# Patient Record
Sex: Male | Born: 1960 | Race: Black or African American | Hispanic: No | Marital: Single | Smoking: Never smoker
Health system: Southern US, Community
[De-identification: ages and names within clinical notes are randomized; demographics above are authoritative.]

## PROBLEM LIST (undated history)

## (undated) DIAGNOSIS — F840 Autistic disorder: Secondary | ICD-10-CM

## (undated) DIAGNOSIS — T7840XA Allergy, unspecified, initial encounter: Secondary | ICD-10-CM

## (undated) DIAGNOSIS — F419 Anxiety disorder, unspecified: Secondary | ICD-10-CM

## (undated) DIAGNOSIS — F99 Mental disorder, not otherwise specified: Secondary | ICD-10-CM

## (undated) DIAGNOSIS — F79 Unspecified intellectual disabilities: Secondary | ICD-10-CM

## (undated) HISTORY — DX: Autistic disorder: F84.0

## (undated) HISTORY — PX: NO PAST SURGERIES: SHX2092

## (undated) HISTORY — DX: Anxiety disorder, unspecified: F41.9

## (undated) HISTORY — DX: Allergy, unspecified, initial encounter: T78.40XA

## (undated) HISTORY — DX: Unspecified intellectual disabilities: F79

## (undated) HISTORY — DX: Mental disorder, not otherwise specified: F99

## (undated) HISTORY — PX: MRI: SHX5353

---

## 2005-01-14 ENCOUNTER — Ambulatory Visit: Payer: Self-pay | Admitting: Family Medicine

## 2008-02-21 DIAGNOSIS — J301 Allergic rhinitis due to pollen: Secondary | ICD-10-CM | POA: Insufficient documentation

## 2012-01-06 DIAGNOSIS — R358 Other polyuria: Secondary | ICD-10-CM | POA: Diagnosis not present

## 2012-01-06 DIAGNOSIS — J309 Allergic rhinitis, unspecified: Secondary | ICD-10-CM | POA: Diagnosis not present

## 2012-01-06 DIAGNOSIS — R3589 Other polyuria: Secondary | ICD-10-CM | POA: Diagnosis not present

## 2012-01-06 DIAGNOSIS — Z79899 Other long term (current) drug therapy: Secondary | ICD-10-CM | POA: Diagnosis not present

## 2012-01-06 DIAGNOSIS — F840 Autistic disorder: Secondary | ICD-10-CM | POA: Diagnosis not present

## 2012-05-10 DIAGNOSIS — Z23 Encounter for immunization: Secondary | ICD-10-CM | POA: Diagnosis not present

## 2012-05-10 DIAGNOSIS — R358 Other polyuria: Secondary | ICD-10-CM | POA: Diagnosis not present

## 2012-05-10 DIAGNOSIS — R7309 Other abnormal glucose: Secondary | ICD-10-CM | POA: Diagnosis not present

## 2012-05-10 DIAGNOSIS — Z0000 Encounter for general adult medical examination without abnormal findings: Secondary | ICD-10-CM | POA: Diagnosis not present

## 2012-05-10 DIAGNOSIS — Z125 Encounter for screening for malignant neoplasm of prostate: Secondary | ICD-10-CM | POA: Diagnosis not present

## 2012-05-10 DIAGNOSIS — R3589 Other polyuria: Secondary | ICD-10-CM | POA: Diagnosis not present

## 2012-05-10 DIAGNOSIS — J309 Allergic rhinitis, unspecified: Secondary | ICD-10-CM | POA: Diagnosis not present

## 2012-10-11 DIAGNOSIS — Z23 Encounter for immunization: Secondary | ICD-10-CM | POA: Diagnosis not present

## 2012-12-11 DIAGNOSIS — Z125 Encounter for screening for malignant neoplasm of prostate: Secondary | ICD-10-CM | POA: Diagnosis not present

## 2012-12-11 DIAGNOSIS — R32 Unspecified urinary incontinence: Secondary | ICD-10-CM | POA: Diagnosis not present

## 2012-12-11 DIAGNOSIS — F840 Autistic disorder: Secondary | ICD-10-CM | POA: Diagnosis not present

## 2012-12-11 DIAGNOSIS — F79 Unspecified intellectual disabilities: Secondary | ICD-10-CM | POA: Diagnosis not present

## 2012-12-11 DIAGNOSIS — Z1159 Encounter for screening for other viral diseases: Secondary | ICD-10-CM | POA: Diagnosis not present

## 2012-12-11 DIAGNOSIS — Z79899 Other long term (current) drug therapy: Secondary | ICD-10-CM | POA: Diagnosis not present

## 2012-12-11 DIAGNOSIS — R7309 Other abnormal glucose: Secondary | ICD-10-CM | POA: Diagnosis not present

## 2013-05-14 DIAGNOSIS — Z1331 Encounter for screening for depression: Secondary | ICD-10-CM | POA: Diagnosis not present

## 2013-05-14 DIAGNOSIS — Z125 Encounter for screening for malignant neoplasm of prostate: Secondary | ICD-10-CM | POA: Diagnosis not present

## 2013-05-14 DIAGNOSIS — Z0000 Encounter for general adult medical examination without abnormal findings: Secondary | ICD-10-CM | POA: Diagnosis not present

## 2013-11-04 DIAGNOSIS — F840 Autistic disorder: Secondary | ICD-10-CM | POA: Diagnosis not present

## 2013-11-04 DIAGNOSIS — R3589 Other polyuria: Secondary | ICD-10-CM | POA: Diagnosis not present

## 2013-11-04 DIAGNOSIS — F79 Unspecified intellectual disabilities: Secondary | ICD-10-CM | POA: Diagnosis not present

## 2013-11-04 DIAGNOSIS — Z23 Encounter for immunization: Secondary | ICD-10-CM | POA: Diagnosis not present

## 2013-11-04 DIAGNOSIS — R358 Other polyuria: Secondary | ICD-10-CM | POA: Diagnosis not present

## 2013-11-30 DIAGNOSIS — K625 Hemorrhage of anus and rectum: Secondary | ICD-10-CM | POA: Diagnosis not present

## 2013-11-30 DIAGNOSIS — F88 Other disorders of psychological development: Secondary | ICD-10-CM | POA: Diagnosis not present

## 2013-12-05 DIAGNOSIS — K625 Hemorrhage of anus and rectum: Secondary | ICD-10-CM | POA: Diagnosis not present

## 2014-08-05 DIAGNOSIS — Z23 Encounter for immunization: Secondary | ICD-10-CM | POA: Diagnosis not present

## 2014-11-27 ENCOUNTER — Ambulatory Visit: Payer: Self-pay | Admitting: Family Medicine

## 2014-11-27 DIAGNOSIS — R05 Cough: Secondary | ICD-10-CM | POA: Diagnosis not present

## 2014-11-27 DIAGNOSIS — R509 Fever, unspecified: Secondary | ICD-10-CM | POA: Diagnosis not present

## 2014-12-04 DIAGNOSIS — J309 Allergic rhinitis, unspecified: Secondary | ICD-10-CM | POA: Diagnosis not present

## 2014-12-04 DIAGNOSIS — R4189 Other symptoms and signs involving cognitive functions and awareness: Secondary | ICD-10-CM | POA: Diagnosis not present

## 2014-12-04 DIAGNOSIS — J40 Bronchitis, not specified as acute or chronic: Secondary | ICD-10-CM | POA: Diagnosis not present

## 2014-12-04 DIAGNOSIS — R05 Cough: Secondary | ICD-10-CM | POA: Diagnosis not present

## 2014-12-09 DIAGNOSIS — E785 Hyperlipidemia, unspecified: Secondary | ICD-10-CM | POA: Diagnosis not present

## 2014-12-09 DIAGNOSIS — G4700 Insomnia, unspecified: Secondary | ICD-10-CM | POA: Diagnosis not present

## 2014-12-09 DIAGNOSIS — R4189 Other symptoms and signs involving cognitive functions and awareness: Secondary | ICD-10-CM | POA: Diagnosis not present

## 2014-12-09 DIAGNOSIS — F419 Anxiety disorder, unspecified: Secondary | ICD-10-CM | POA: Diagnosis not present

## 2014-12-09 DIAGNOSIS — R7309 Other abnormal glucose: Secondary | ICD-10-CM | POA: Diagnosis not present

## 2014-12-09 LAB — BASIC METABOLIC PANEL: Glucose: 83 mg/dL

## 2014-12-09 LAB — TSH: TSH: 4.67 u[IU]/mL (ref <=5.90)

## 2014-12-09 LAB — LIPID PANEL
Cholesterol: 194 mg/dL (ref 0–200)
HDL: 52 mg/dL (ref 35–70)
LDL CALC: 119 mg/dL
Triglycerides: 116 mg/dL (ref 40–160)

## 2014-12-09 LAB — HEMOGLOBIN A1C: Hgb A1c MFr Bld: 5.9 % (ref 4.0–6.0)

## 2015-02-23 DIAGNOSIS — F840 Autistic disorder: Secondary | ICD-10-CM

## 2015-04-14 ENCOUNTER — Encounter: Payer: Self-pay | Admitting: Family Medicine

## 2015-04-14 ENCOUNTER — Ambulatory Visit (INDEPENDENT_AMBULATORY_CARE_PROVIDER_SITE_OTHER): Payer: Medicare Other | Admitting: Family Medicine

## 2015-04-14 VITALS — BP 102/68 | HR 96 | Temp 98.7°F | Resp 16 | Ht 70.0 in | Wt 191.5 lb

## 2015-04-14 DIAGNOSIS — E782 Mixed hyperlipidemia: Secondary | ICD-10-CM | POA: Insufficient documentation

## 2015-04-14 DIAGNOSIS — N3281 Overactive bladder: Secondary | ICD-10-CM

## 2015-04-14 DIAGNOSIS — F419 Anxiety disorder, unspecified: Secondary | ICD-10-CM

## 2015-04-14 DIAGNOSIS — F840 Autistic disorder: Secondary | ICD-10-CM

## 2015-04-14 DIAGNOSIS — E785 Hyperlipidemia, unspecified: Secondary | ICD-10-CM

## 2015-04-14 DIAGNOSIS — R569 Unspecified convulsions: Secondary | ICD-10-CM | POA: Diagnosis not present

## 2015-04-14 DIAGNOSIS — J301 Allergic rhinitis due to pollen: Secondary | ICD-10-CM | POA: Diagnosis not present

## 2015-04-14 DIAGNOSIS — R4189 Other symptoms and signs involving cognitive functions and awareness: Secondary | ICD-10-CM | POA: Diagnosis not present

## 2015-04-14 NOTE — Patient Instructions (Addendum)
F/U 4 M  Obesity Obesity is defined as having too much total body fat and a body mass index (BMI) of 30 or more. BMI is an estimate of body fat and is calculated from your height and weight. Obesity happens when you consume more calories than you can burn by exercising or performing daily physical tasks. Prolonged obesity can cause major illnesses or emergencies, such as:   Stroke.  Heart disease.  Diabetes.  Cancer.  Arthritis.  High blood pressure (hypertension).  High cholesterol.  Sleep apnea.  Erectile dysfunction.  Infertility problems. CAUSES   Regularly eating unhealthy foods.  Physical inactivity.  Certain disorders, such as an underactive thyroid (hypothyroidism), Cushing's syndrome, and polycystic ovarian syndrome.  Certain medicines, such as steroids, some depression medicines, and antipsychotics.  Genetics.  Lack of sleep. DIAGNOSIS  A health care provider can diagnose obesity after calculating your BMI. Obesity will be diagnosed if your BMI is 30 or higher.  There are other methods of measuring obesity levels. Some other methods include measuring your skinfold thickness, your waist circumference, and comparing your hip circumference to your waist circumference. TREATMENT  A healthy treatment program includes some or all of the following:  Long-term dietary changes.  Exercise and physical activity.  Behavioral and lifestyle changes.  Medicine only under the supervision of your health care provider. Medicines may help, but only if they are used with diet and exercise programs. An unhealthy treatment program includes:  Fasting.  Fad diets.  Supplements and drugs. These choices do not succeed in long-term weight control.  HOME CARE INSTRUCTIONS   Exercise and perform physical activity as directed by your health care provider. To increase physical activity, try the following:  Use stairs instead of elevators.  Park farther away from store  entrances.  Garden, bike, or walk instead of watching television or using the computer.  Eat healthy, low-calorie foods and drinks on a regular basis. Eat more fruits and vegetables. Use low-calorie cookbooks or take healthy cooking classes.  Limit fast food, sweets, and processed snack foods.  Eat smaller portions.  Keep a daily journal of everything you eat. There are many free websites to help you with this. It may be helpful to measure your foods so you can determine if you are eating the correct portion sizes.  Avoid drinking alcohol. Drink more water and drinks without calories.  Take vitamins and supplements only as recommended by your health care provider.  Weight-loss support groups, Government social research officer, counselors, and stress reduction education can also be very helpful. SEEK IMMEDIATE MEDICAL CARE IF:  You have chest pain or tightness.  You have trouble breathing or feel short of breath.  You have weakness or leg numbness.  You feel confused or have trouble talking.  You have sudden changes in your vision. MAKE SURE YOU:  Understand these instructions.  Will watch your condition.  Will get help right away if you are not doing well or get worse. Document Released: 11/24/2004 Document Revised: 03/03/2014 Document Reviewed: 11/23/2011 [facility] Patient Information 2015 [place], [place]. This information is not intended to replace advice given to you by your health care provider. Make sure you discuss any questions you have with your health care provider.

## 2015-04-14 NOTE — Progress Notes (Signed)
Name: Erik Henderson   MRN: [id number]    DOB: 30-May-1961   Date:04/14/2015       Progress Note  Subjective  Chief Complaint  Chief Complaint  Patient presents with  . Follow-up    autism and mental retardation  . Anxiety    Anxiety Presents for follow-up visit. Onset was more than 5 years ago. Symptoms include confusion, insomnia, irritability, nervous/anxious behavior and palpitations. Patient reports no chest pain, dizziness, nausea or shortness of breath. Symptoms occur occasionally. The severity of symptoms is moderate. The symptoms are aggravated by caffeine, work stress and social activities. The quality of sleep is fair.    Seizures  This is a chronic problem. The current episode started more than 1 week ago. The problem has not changed since onset.Associated symptoms include sleepiness and confusion. Pertinent negatives include no headaches, no sore throat, no chest pain, no cough, no nausea, no vomiting and no diarrhea. Characteristics include rhythmic jerking. Characteristics do not include loss of consciousness. There has been no fever.    Autism  Patient has been stable. He continues to live with his immediate family. Most of his care is assisted by siblings and his parents   History reviewed. No pertinent past medical history.  History  Substance Use Topics  . Smoking status: Never Smoker   . Smokeless tobacco: Not on file  . Alcohol Use: No     Current outpatient prescriptions:  .  cetirizine (ZYRTEC) 10 MG tablet, , Disp: , Rfl:  .  divalproex (DEPAKOTE) 250 MG DR tablet, DEPAKOTE ORAL TABLET ENTERIC COATED 250 MG  2 TABLETS 3 XS A DAY for 30 days  Quantity: 180.00;  Refills: 7   Ordered :21-Feb-2008  Dennison Mascot MD;  Mora Appl 21-Feb-2008 Discontinued Comments: DX: 345, Disp: , Rfl:  .  LORazepam (ATIVAN) 1 MG tablet, Take by mouth., Disp: , Rfl:  .  oxybutynin (DITROPAN-XL) 10 MG 24 hr tablet, Take by mouth., Disp: , Rfl:  .  risperiDONE (RISPERDAL) 2 MG  tablet, Take by mouth., Disp: , Rfl:  .  tolterodine (DETROL LA) 4 MG 24 hr capsule, DETROL LA ORAL CAPSULE 24 HR 4 MG  1 Every Day for 30 days  Quantity: 30.00;  Refills: 7   Ordered :21-Feb-2008  Dennison Mascot MD;  Mora Appl 21-Feb-2008 Discontinued Comments: DX: 788.41, Disp: , Rfl:   No Known Allergies  Review of Systems  Constitutional: Positive for irritability. Negative for fever, chills and weight loss.  HENT: Negative for congestion, hearing loss, sore throat and tinnitus.   Eyes: Negative for blurred vision, double vision and redness.  Respiratory: Negative for cough, hemoptysis and shortness of breath.   Cardiovascular: Positive for palpitations. Negative for chest pain, orthopnea, claudication and leg swelling.  Gastrointestinal: Negative for heartburn, nausea, vomiting, diarrhea, constipation and blood in stool.  Genitourinary: Negative for dysuria, urgency, frequency and hematuria.  Musculoskeletal: Negative for myalgias, back pain, joint pain, falls and neck pain.  Skin: Negative for itching.  Neurological: Positive for seizures. Negative for dizziness, tingling, tremors, focal weakness, loss of consciousness, weakness and headaches.  Endo/Heme/Allergies: Does not bruise/bleed easily.  Psychiatric/Behavioral: Positive for confusion. Negative for depression and substance abuse. The patient is nervous/anxious and has insomnia.        Autism autism       Objective  Filed Vitals:   04/14/15 1455  BP: 102/68  Pulse: 96  Temp: 98.7 F (37.1 C)  Resp: 16  Height:  (1.778 m)  Weight: 191 lb  8 oz (86.864 kg)  SpO2: 97%     Physical Exam  Constitutional: He is well-developed, well-nourished, and in no distress.  HENT:  Head: Normocephalic.  Eyes: EOM are normal. Pupils are equal, round, and reactive to light.  Neck: Normal range of motion. Neck supple. No thyromegaly present.  Cardiovascular: Normal rate, regular rhythm and normal heart sounds.   No murmur  heard. Pulmonary/Chest: Effort normal and breath sounds normal. No respiratory distress. He has no wheezes.  Abdominal: Soft. Bowel sounds are normal.  Musculoskeletal: Normal range of motion. He exhibits no edema.  Lymphadenopathy:    He has no cervical adenopathy.  Neurological: He is alert. No cranial nerve deficit. Gait normal. Coordination normal.  Autistic  Skin: Skin is warm and dry. No rash noted.       Assessment & Plan  1. Anxiety Stable - LORazepam (ATIVAN) 1 MG tablet; Take by mouth.  2. Active autistic disorder Stable - risperiDONE (RISPERDAL) 2 MG tablet; Take by mouth.  3. HLD (hyperlipidemia) Controlled  4. Cognitive decline Stable  5. Hay fever Stable - cetirizine (ZYRTEC) 10 MG tablet;   6. Overactive bladder Controlled  7. Seizure None recently - divalproex (DEPAKOTE) 250 MG DR tablet; DEPAKOTE ORAL TABLET ENTERIC COATED 250 MG  2 TABLETS 3 XS A DAY for 30 days  Quantity: 180.00;  Refills: 7   Ordered :21-Feb-2008  Dennison Mascot MD;  Started 21-Feb-2008 Discontinued Comments: DX: 345

## 2015-04-29 ENCOUNTER — Telehealth: Payer: Self-pay | Admitting: Family Medicine

## 2015-04-29 DIAGNOSIS — F419 Anxiety disorder, unspecified: Secondary | ICD-10-CM

## 2015-04-29 MED ORDER — LORAZEPAM 1 MG PO TABS: 1.0000 mg | ORAL_TABLET | Freq: Two times a day (BID) | ORAL | Status: DC

## 2015-04-29 NOTE — Telephone Encounter (Signed)
Sent to pharmacy 

## 2015-04-29 NOTE — Telephone Encounter (Signed)
Patient is completely out of lorazepam 2mg . [facility].

## 2015-06-05 ENCOUNTER — Other Ambulatory Visit: Payer: Self-pay | Admitting: Family Medicine

## 2015-07-01 ENCOUNTER — Other Ambulatory Visit: Payer: Self-pay

## 2015-07-01 DIAGNOSIS — F419 Anxiety disorder, unspecified: Secondary | ICD-10-CM

## 2015-07-01 MED ORDER — LORAZEPAM 1 MG PO TABS: 1.0000 mg | ORAL_TABLET | Freq: Two times a day (BID) | ORAL | Status: DC

## 2015-07-04 ENCOUNTER — Other Ambulatory Visit: Payer: Self-pay | Admitting: Family Medicine

## 2015-08-17 ENCOUNTER — Encounter: Payer: Self-pay | Admitting: Family Medicine

## 2015-08-17 ENCOUNTER — Ambulatory Visit (INDEPENDENT_AMBULATORY_CARE_PROVIDER_SITE_OTHER): Payer: Medicare Other | Admitting: Family Medicine

## 2015-08-17 VITALS — BP 124/68 | HR 92 | Temp 98.5°F | Resp 18 | Ht 70.0 in | Wt 191.4 lb

## 2015-08-17 DIAGNOSIS — F419 Anxiety disorder, unspecified: Secondary | ICD-10-CM | POA: Diagnosis not present

## 2015-08-17 DIAGNOSIS — R739 Hyperglycemia, unspecified: Secondary | ICD-10-CM

## 2015-08-17 DIAGNOSIS — R4189 Other symptoms and signs involving cognitive functions and awareness: Secondary | ICD-10-CM | POA: Diagnosis not present

## 2015-08-17 DIAGNOSIS — Z23 Encounter for immunization: Secondary | ICD-10-CM | POA: Diagnosis not present

## 2015-08-17 DIAGNOSIS — F840 Autistic disorder: Secondary | ICD-10-CM | POA: Diagnosis not present

## 2015-08-17 LAB — POCT GLYCOSYLATED HEMOGLOBIN (HGB A1C): Hemoglobin A1C: 5.5

## 2015-08-17 LAB — GLUCOSE, POCT (MANUAL RESULT ENTRY): POC GLUCOSE: 87 mg/dL (ref 70–99)

## 2015-08-17 MED ORDER — LORAZEPAM 1 MG PO TABS: 1.0000 mg | ORAL_TABLET | Freq: Two times a day (BID) | ORAL | Status: DC | PRN

## 2015-08-17 NOTE — Progress Notes (Signed)
Name: Erik Henderson   MRN: [id number]    DOB: Dec 08, 1960   Date:08/17/2015       Progress Note  Subjective  Chief Complaint  Chief Complaint  Patient presents with  . Anxiety    4 month follow up  . Hyperlipidemia  . OTHER    Mental disorder/Autistic    HPI  Hyperlipidemia  Patient has a history of hyperlipidemia for over 5 years.  Current medical regimen consist of diet and exercise.  Compliance isimproving .  Diet and exercise are currently followed consistent with .  Risk factors for cardiovascular disease include hyperlipidemia  .   There have been no side effects from the medication.    Anxiety history of present illness  Patient has occasional anxiety associated with his autism and cognitive impairment. He uses lorazepam when necessary basis as well as risperidone.  Autism cognitive impairment and behavioral issues  Patient is on Depakote. As well as lorazepam 1 mg risperidone 2 mg his sister who primarily uses his parents care for the patient states that these regimen is working  Metabolic syndrome  Patient has an elevated blood glucose as well as hemoglobin A1c. No polyuria polydipsia polyphagia currently.    Past Medical History  Diagnosis Date  . Anxiety   . Mental disorder   . Autistic disorder     Social History  Substance Use Topics  . Smoking status: Never Smoker   . Smokeless tobacco: Not on file  . Alcohol Use: No     Current outpatient prescriptions:  .  cetirizine (ZYRTEC) 10 MG tablet, , Disp: , Rfl:  .  divalproex (DEPAKOTE) 250 MG DR tablet, TAKE (2) TABLETS BY MOUTH THREE TIMES DAILY., Disp: 180 tablet, Rfl: 0 .  LORazepam (ATIVAN) 1 MG tablet, Take 1 tablet (1 mg total) by mouth 2 (two) times daily., Disp: 60 tablet, Rfl: 2 .  risperiDONE (RISPERDAL) 2 MG tablet, Take by mouth., Disp: , Rfl:   No Known Allergies  Review of Systems  Constitutional: Negative for fever, chills and weight loss.  HENT: Negative for congestion, hearing  loss, sore throat and tinnitus.   Eyes: Negative for blurred vision, double vision and redness.  Respiratory: Negative for cough, hemoptysis and shortness of breath.   Cardiovascular: Negative for chest pain, palpitations, orthopnea, claudication and leg swelling.  Gastrointestinal: Negative for heartburn, nausea, vomiting, diarrhea, constipation and blood in stool.  Genitourinary: Negative for dysuria, urgency, frequency and hematuria.  Musculoskeletal: Negative for myalgias, back pain, joint pain, falls and neck pain.  Skin: Negative for itching.  Neurological: Negative for dizziness, tingling, tremors, focal weakness, seizures, loss of consciousness, weakness and headaches.       Hemostasis with moderate cognitive impairment  Endo/Heme/Allergies: Does not bruise/bleed easily.  Psychiatric/Behavioral: Negative for depression and substance abuse. The patient is not nervous/anxious and does not have insomnia.      Objective  Filed Vitals:   08/17/15 1200  BP: 124/68  Pulse: 92  Temp: 98.5 F (36.9 C)  TempSrc: Oral  Resp: 18  Height:  (1.778 m)  Weight: 191 lb 6.4 oz (86.818 kg)  SpO2: 97%     Physical Exam  Constitutional: He is well-developed, well-nourished, and in no distress.  HENT:  Head: Normocephalic.  Eyes: EOM are normal. Pupils are equal, round, and reactive to light.  Neck: Normal range of motion. Neck supple. No thyromegaly present.  Cardiovascular: Normal rate, regular rhythm and normal heart sounds.   No murmur heard. Pulmonary/Chest: Effort normal  and breath sounds normal. No respiratory distress. He has no wheezes.  Abdominal: Soft. Bowel sounds are normal.  Musculoskeletal: Normal range of motion. He exhibits no edema.  Lymphadenopathy:    He has no cervical adenopathy.  Neurological: No cranial nerve deficit. Coordination normal.  Autistic behavior and mannerisms. These are baseline.  Skin: Skin is warm and dry. No rash noted.  Vitals  reviewed.     Assessment & Plan   1. Need for influenza vaccination Given today - Flu Vaccine QUAD 36+ mos PF IM (Fluarix & Fluzone Quad PF)  2. Hyperglycemia Check glucose and A1c - POCT HgB A1C - POCT Glucose (CBG)  3. Anxiety Continue Ativan when necessary  4. Autism Continue Risperdal  5. Cognitive impairment Stable in his home environment is supportive family

## 2015-10-06 ENCOUNTER — Other Ambulatory Visit: Payer: Self-pay | Admitting: Family Medicine

## 2015-10-26 ENCOUNTER — Encounter: Payer: Self-pay | Admitting: Family Medicine

## 2015-11-05 ENCOUNTER — Other Ambulatory Visit: Payer: Self-pay | Admitting: Family Medicine

## 2015-11-18 ENCOUNTER — Other Ambulatory Visit: Payer: Self-pay | Admitting: Family Medicine

## 2015-12-23 ENCOUNTER — Ambulatory Visit: Payer: Medicare Other | Admitting: Family Medicine

## 2016-01-01 ENCOUNTER — Other Ambulatory Visit: Payer: Self-pay | Admitting: Family Medicine

## 2016-01-05 ENCOUNTER — Other Ambulatory Visit: Payer: Self-pay | Admitting: Family Medicine

## 2016-01-27 ENCOUNTER — Other Ambulatory Visit: Payer: Self-pay

## 2016-01-27 MED ORDER — LORAZEPAM 1 MG PO TABS: 1.0000 mg | ORAL_TABLET | Freq: Two times a day (BID) | ORAL | Status: DC | PRN

## 2016-02-05 ENCOUNTER — Other Ambulatory Visit: Payer: Self-pay | Admitting: Family Medicine

## 2016-02-09 ENCOUNTER — Ambulatory Visit: Payer: Medicare Other | Admitting: Family Medicine

## 2016-02-11 ENCOUNTER — Other Ambulatory Visit: Payer: Self-pay | Admitting: Family Medicine

## 2016-02-12 IMAGING — CR DG CHEST 2V
1 series · 3 of 3 positions shown · non-contrast
Comparison: None.

CLINICAL DATA: Productive cough 2 days

EXAM:
CHEST  2 VIEW

[Series 1: kdxr chest pa (or ap) and lat · 0.14mm/px · 3 of 3 slices shown]
[im 1/3]
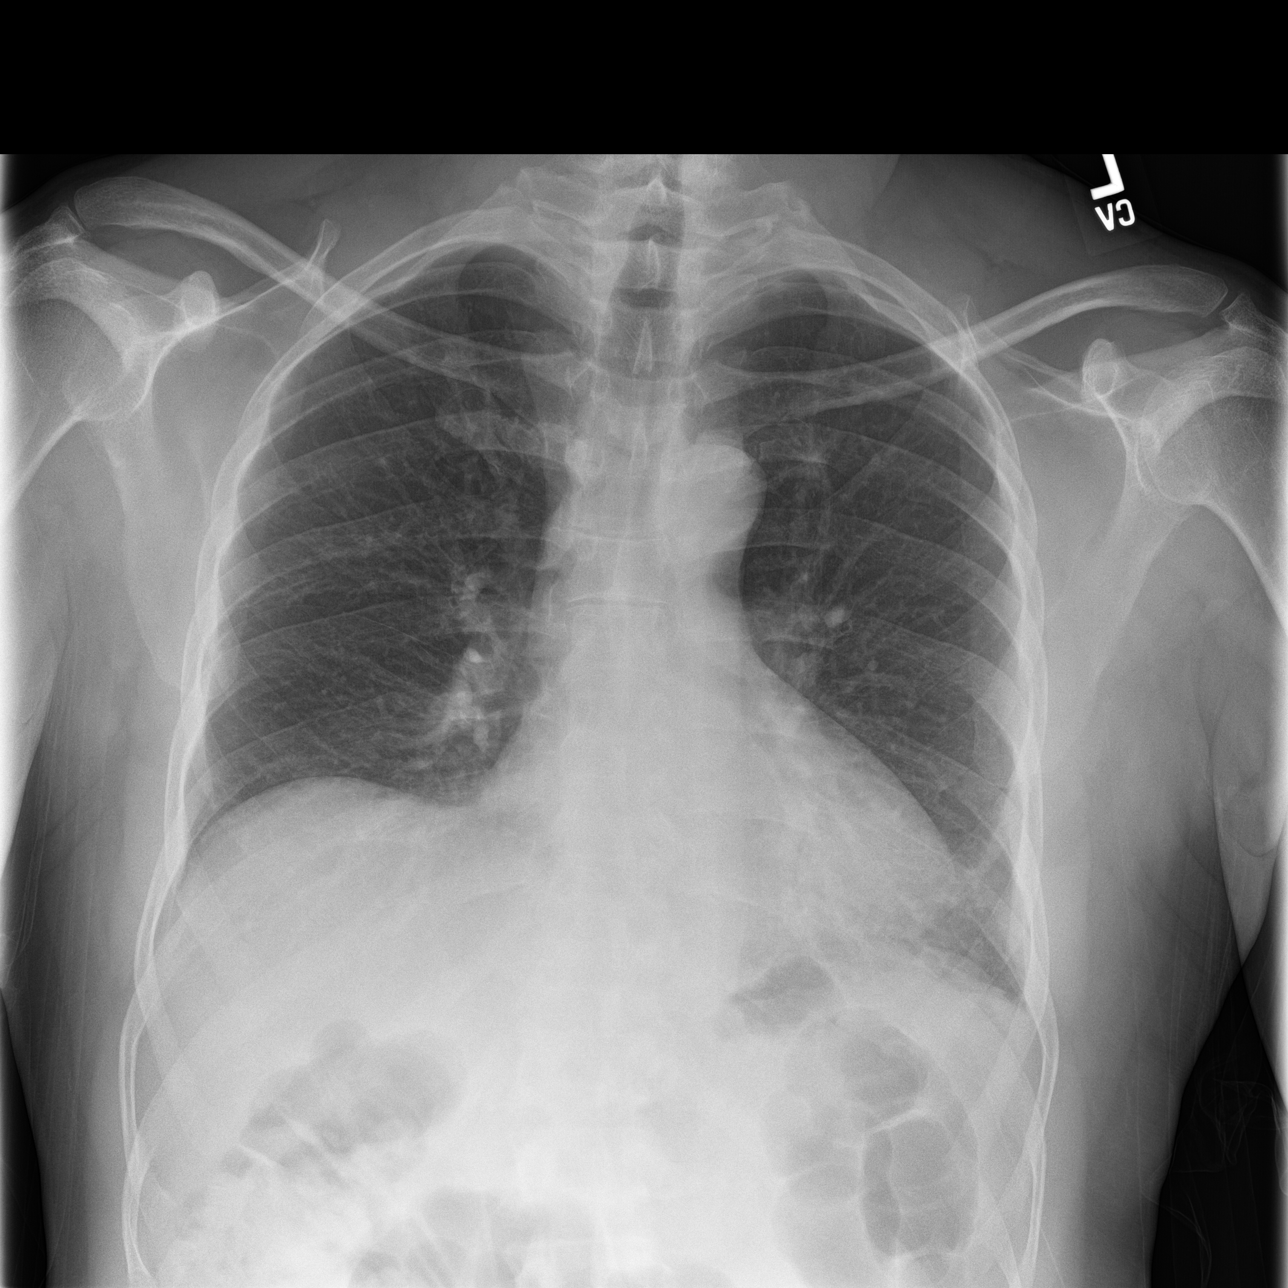
[im 2/3]
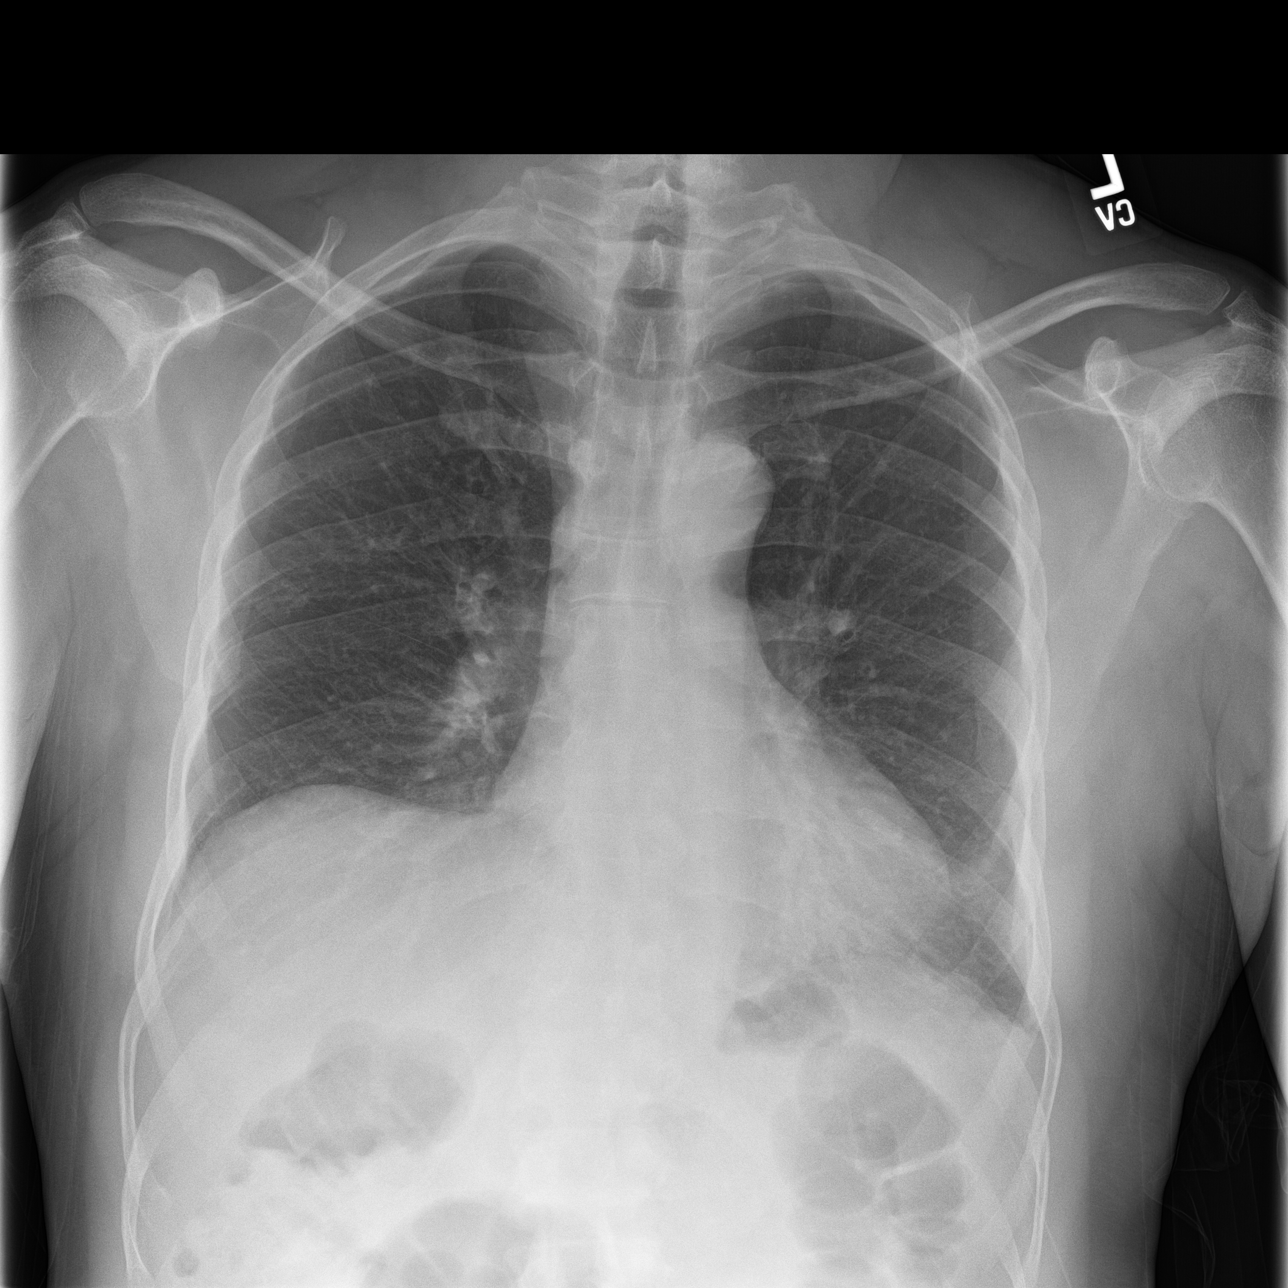
[im 3/3]
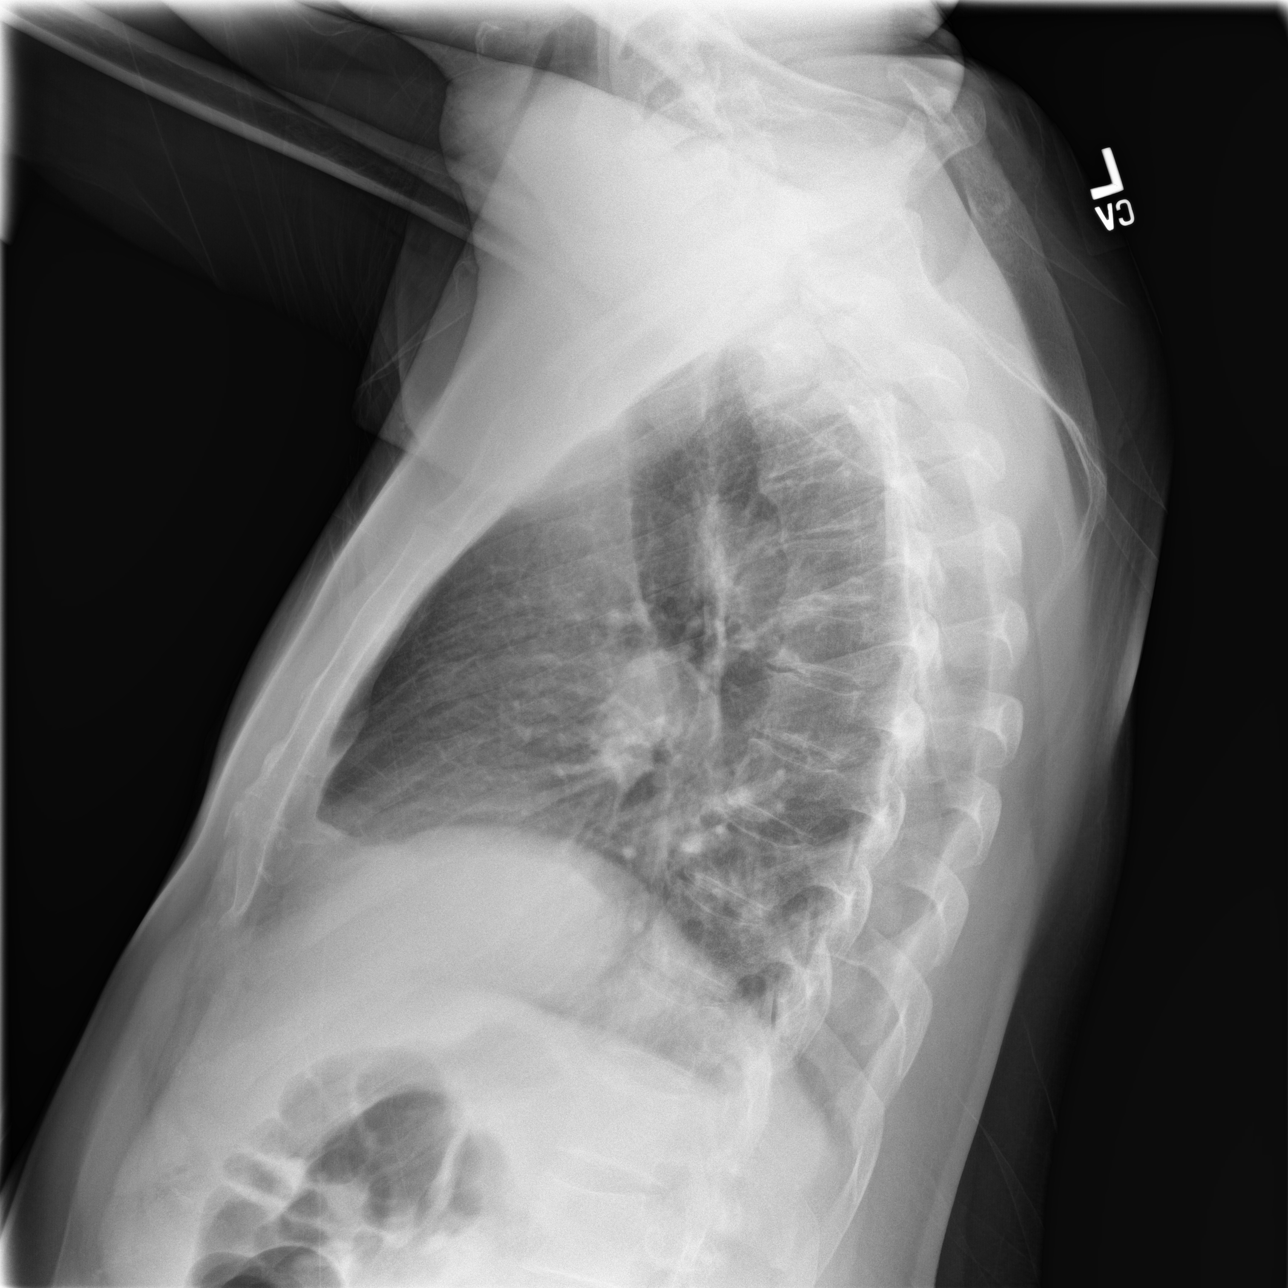

[3 of 3 positions shown; findings below may reference images not displayed]

FINDINGS: Heart size upper normal. Vascularity normal. Allowing for mild
motion on the study. No definite pneumonia. Possible mild
atelectasis in the lung bases. Negative for mass or effusion.
IMPRESSION: No active cardiopulmonary disease.

## 2016-03-07 ENCOUNTER — Other Ambulatory Visit: Payer: Self-pay | Admitting: Family Medicine

## 2016-03-26 ENCOUNTER — Other Ambulatory Visit: Payer: Self-pay | Admitting: Family Medicine

## 2016-03-30 ENCOUNTER — Other Ambulatory Visit: Payer: Self-pay | Admitting: Family Medicine

## 2016-04-13 ENCOUNTER — Ambulatory Visit: Payer: Self-pay | Admitting: Family Medicine

## 2016-04-14 ENCOUNTER — Ambulatory Visit: Payer: Self-pay | Admitting: Family Medicine

## 2016-04-14 ENCOUNTER — Ambulatory Visit (INDEPENDENT_AMBULATORY_CARE_PROVIDER_SITE_OTHER): Payer: Medicare Other | Admitting: Family Medicine

## 2016-04-14 ENCOUNTER — Encounter: Payer: Self-pay | Admitting: Family Medicine

## 2016-04-14 VITALS — BP 114/68 | HR 87 | Temp 98.0°F | Resp 18 | Ht 70.0 in | Wt 188.1 lb

## 2016-04-14 DIAGNOSIS — J301 Allergic rhinitis due to pollen: Secondary | ICD-10-CM | POA: Diagnosis not present

## 2016-04-14 DIAGNOSIS — F840 Autistic disorder: Secondary | ICD-10-CM | POA: Diagnosis not present

## 2016-04-14 MED ORDER — DIVALPROEX SODIUM 250 MG PO DR TAB: 500.0000 mg | DELAYED_RELEASE_TABLET | Freq: Three times a day (TID) | ORAL | Status: DC

## 2016-04-14 MED ORDER — RISPERIDONE 2 MG PO TABS: 2.0000 mg | ORAL_TABLET | Freq: Two times a day (BID) | ORAL | Status: DC

## 2016-04-14 NOTE — Progress Notes (Signed)
Date:  04/14/2016   Name:  Erik Henderson   DOB:  1960/12/19   MRN:  [id number]  PCP:  Dennison MascotLemont Morrisey, MD    Chief Complaint: Medication Refill and Anxiety   History of Present Illness:  This is a 55 y.o. male seen for six month f/u. Hx MR/autism well controlled on Risperdal, Ativan, and Depakote. Also takes Zyrtec but no recent AR sxs noted.  Review of Systems:  Review of Systems  Constitutional: Negative for fever.  Respiratory: Negative for cough and shortness of breath.   Cardiovascular: Negative for chest pain and leg swelling.  Genitourinary: Negative for difficulty urinating.  Neurological: Negative for seizures and syncope.    Patient Active Problem List   Diagnosis Date Noted  . Hyperglycemia 08/17/2015  . Anxiety 04/14/2015  . HLD (hyperlipidemia) 04/14/2015  . Cognitive impairment 04/14/2015  . Autism 02/23/2015  . Hay fever 02/21/2008    Prior to Admission medications   Medication Sig Start Date End Date Taking? Authorizing Provider  divalproex (DEPAKOTE) 250 MG DR tablet Take 2 tablets (500 mg total) by mouth 3 (three) times daily. 04/14/16   Schuyler AmorWilliam Montray Kliebert, MD  LORazepam (ATIVAN) 1 MG tablet Take 1 tablet (1 mg total) by mouth 2 (two) times daily. 07/01/15   Dennison MascotLemont Morrisey, MD  risperiDONE (RISPERDAL) 2 MG tablet Take 1 tablet (2 mg total) by mouth 2 (two) times daily. 04/14/16   Schuyler AmorWilliam Anais Koenen, MD    No Known Allergies  Past Surgical History  Procedure Laterality Date  . No past surgeries      Social History  Substance Use Topics  . Smoking status: Never Smoker   . Smokeless tobacco: None  . Alcohol Use: No    Family History  Problem Relation Age of Onset  . Hypertension Mother   . Hyperlipidemia Mother   . Hypertension Father   . Coronary artery disease Father   . Hypertension Brother   . Autism Brother   . Mental retardation Brother     Medication list has been reviewed and updated.  Physical Examination: BP 114/68 mmHg  Pulse 87   Temp(Src) 98 F (36.7 C)  Resp 18  Ht 5\' 10"  (1.778 m)  Wt 188 lb 1 oz (85.305 kg)  BMI 26.98 kg/m2  SpO2 97%  Physical Exam  Constitutional: He appears well-developed and well-nourished.  Cardiovascular: Normal rate, regular rhythm and normal heart sounds.   Pulmonary/Chest: Effort normal and breath sounds normal.  Musculoskeletal: He exhibits no edema.  Neurological: He is alert.  Skin: Skin is warm and dry.  Psychiatric: He has a normal mood and affect. His behavior is normal.  Nursing note and vitals reviewed.   Assessment and Plan:  1. Autism Refill Risperdal and Depakote (unable to refill Ativan as locums)  2. Hay fever D/c Zyrtec, call for worsened sxs  Return in about 6 months (around 10/14/2016).  Dionne AnoWilliam M. Kingsley SpittlePlonk, Jr. MD [facility]  04/14/2016

## 2016-04-20 ENCOUNTER — Telehealth: Payer: Self-pay

## 2016-04-20 DIAGNOSIS — F419 Anxiety disorder, unspecified: Secondary | ICD-10-CM

## 2016-04-20 MED ORDER — LORAZEPAM 1 MG PO TABS: 1.0000 mg | ORAL_TABLET | Freq: Two times a day (BID) | ORAL | Status: DC

## 2016-04-20 NOTE — Telephone Encounter (Signed)
Pt was seen by Dr. Hollace HaywardPlonk on 04/14/16 but he would not refill his lorazepam. Will you be willing to fill it until his next appointment which will be with you in Sept.?

## 2016-04-20 NOTE — Telephone Encounter (Signed)
I will give only 30 pills to take prn for the next 3 months

## 2016-05-06 ENCOUNTER — Telehealth: Payer: Self-pay | Admitting: Family Medicine

## 2016-05-06 NOTE — Telephone Encounter (Signed)
Amada JupiterDale (sister) states patient was seen by Dr Hollace HaywardPlonk on 04/14/16 and he has upcoming appointment with you. She is asking for a refill for Lorazepam 1mg .

## 2016-05-13 ENCOUNTER — Other Ambulatory Visit: Payer: Self-pay

## 2016-05-13 DIAGNOSIS — F419 Anxiety disorder, unspecified: Secondary | ICD-10-CM

## 2016-05-13 NOTE — Telephone Encounter (Signed)
[facility]ill of this patient's Lorazepam 1 mg.  Refill request was sent to Dr. Alba CoryKrichna Sowles for approval and submission.

## 2016-05-16 ENCOUNTER — Other Ambulatory Visit: Payer: Self-pay | Admitting: Family Medicine

## 2016-05-16 DIAGNOSIS — F419 Anxiety disorder, unspecified: Secondary | ICD-10-CM

## 2016-05-16 MED ORDER — LORAZEPAM 0.5 MG PO TABS
1.0000 mg | ORAL_TABLET | Freq: Every day | ORAL | Status: DC
Start: 2016-05-16 — End: 2016-06-06

## 2016-06-06 ENCOUNTER — Encounter: Payer: Self-pay | Admitting: Family Medicine

## 2016-06-06 ENCOUNTER — Ambulatory Visit (INDEPENDENT_AMBULATORY_CARE_PROVIDER_SITE_OTHER): Payer: Medicare Other | Admitting: Family Medicine

## 2016-06-06 VITALS — BP 120/86 | HR 73 | Temp 98.7°F | Resp 18 | Ht 70.0 in | Wt 189.1 lb

## 2016-06-06 DIAGNOSIS — F419 Anxiety disorder, unspecified: Secondary | ICD-10-CM | POA: Diagnosis not present

## 2016-06-06 DIAGNOSIS — Z79899 Other long term (current) drug therapy: Secondary | ICD-10-CM

## 2016-06-06 DIAGNOSIS — Z1159 Encounter for screening for other viral diseases: Secondary | ICD-10-CM | POA: Diagnosis not present

## 2016-06-06 DIAGNOSIS — R739 Hyperglycemia, unspecified: Secondary | ICD-10-CM | POA: Diagnosis not present

## 2016-06-06 DIAGNOSIS — F840 Autistic disorder: Secondary | ICD-10-CM | POA: Diagnosis not present

## 2016-06-06 DIAGNOSIS — R569 Unspecified convulsions: Secondary | ICD-10-CM | POA: Diagnosis not present

## 2016-06-06 DIAGNOSIS — R451 Restlessness and agitation: Secondary | ICD-10-CM | POA: Diagnosis not present

## 2016-06-06 LAB — CBC
HCT: 40.6 % (ref 38.5–50.0)
HEMOGLOBIN: 13.5 g/dL (ref 13.2–17.1)
MCH: 29.1 pg (ref 27.0–33.0)
MCHC: 33.3 g/dL (ref 32.0–36.0)
MCV: 87.5 fL (ref 80.0–100.0)
MPV: 9.9 fL (ref 7.5–12.5)
Platelets: 230 10*3/uL (ref 140–400)
RBC: 4.64 MIL/uL (ref 4.20–5.80)
RDW: 13.5 % (ref 11.0–15.0)
WBC: 6 10*3/uL (ref 3.8–10.8)

## 2016-06-06 MED ORDER — LORAZEPAM 0.5 MG PO TABS: 0.5000 mg | ORAL_TABLET | Freq: Every day | ORAL | 2 refills | Status: DC

## 2016-06-06 NOTE — Progress Notes (Signed)
Name: Erik Henderson   MRN: [id number]    DOB: 10/09/1961   Date:06/06/2016       Progress Note  Subjective  Chief Complaint  Chief Complaint  Patient presents with  . Anxiety  . Medication Refill    ativan,divalprovex,risperidone  . Autism    HPI  Agitation/Austims: he is down from Lorazepam  to 0.5 mg qhs for sleep and seems to be tolerating it well. He has difficulty sleeping even with Risperdal 4 mg qhs . Also takes 2 mg in am. He lives with mother, sister Erik Henderson ) that are his caregivers also has two other brothers with disability at home.   Hyperglycemia: mother has diabetes, he is on diabetic diet, his weight has been stable, no change in appetite or urinary frequency  Dyslipidemia: LDL was 119, on diet only, no chest pain or SOB   Patient Active Problem List   Diagnosis Date Noted  . Anxiety 04/14/2015  . Cognitive impairment 04/14/2015  . Autism 02/23/2015  . Hay fever 02/21/2008    Past Surgical History:  Procedure Laterality Date  . NO PAST SURGERIES      Family History  Problem Relation Age of Onset  . Hypertension Mother   . Hyperlipidemia Mother   . Hypertension Father   . Coronary artery disease Father   . Hypertension Brother   . Autism Brother   . Mental retardation Brother     Social History   Social History  . Marital status: Single    Spouse name: N/A  . Number of children: N/A  . Years of education: N/A   Occupational History  . Not on file.   Social History Main Topics  . Smoking status: Never Smoker  . Smokeless tobacco: Not on file  . Alcohol use No  . Drug use: No  . Sexual activity: No   Other Topics Concern  . Not on file   Social History Narrative   ** Merged History Encounter **         Current Outpatient Prescriptions:  .  divalproex (DEPAKOTE) 250 MG DR tablet, Take 2 tablets (500 mg total) by mouth 3 (three) times daily., Disp: 180 tablet, Rfl: 5 .  LORazepam (ATIVAN) 0.5 MG tablet, Take 1 tablet (0.5 mg  total) by mouth at bedtime., Disp: 30 tablet, Rfl: 2 .  risperiDONE (RISPERDAL) 2 MG tablet, Take 1 tablet (2 mg total) by mouth 2 (two) times daily., Disp: 180 tablet, Rfl: 3  No Known Allergies   ROS  Ten systems reviewed and is negative except as mentioned in HPI   Objective  Vitals:   06/06/16 1341  BP: 120/86  Pulse: 73  Resp: 18  Temp: 98.7 F (37.1 C)  SpO2: 97%  Weight: 189 lb 1 oz (85.8 kg)  Height:  (1.778 m)    Body mass index is 27.13 kg/m.  Physical Exam   Constitutional: Patient appears well-developed and well-nourished.  No distress.  HEENT: head atraumatic, normocephalic, pupils equal and reactive to light, neck supple, throat within normal limits Cardiovascular: Normal rate, regular rhythm and normal heart sounds.  No murmur heard. No BLE edema. Pulmonary/Chest: Effort normal and breath sounds normal. No respiratory distress. Abdominal: Soft.  There is no tenderness. Psychiatric: Patient is calm and cooperative , non-verbal at this time, but he has been humming during visit. Seems content.   PHQ2/9: Depression screen [facility] 2/9 06/06/2016 08/17/2015 04/14/2015  Decreased Interest 0 0 0  Down, Depressed, Hopeless 0 0 0  PHQ - 2 Score 0 0 0     Fall Risk: Fall Risk  06/06/2016 08/17/2015 04/14/2015  Falls in the past year? No No No     Functional Status Survey: Is the patient deaf or have difficulty hearing?: No Does the patient have difficulty seeing, even when wearing glasses/contacts?: No Does the patient have difficulty concentrating, remembering, or making decisions?: Yes Does the patient have difficulty walking or climbing stairs?: No Does the patient have difficulty dressing or bathing?: Yes Does the patient have difficulty doing errands alone such as visiting a doctor's office or shopping?: Yes   Assessment & Plan  1. Anxiety  Currently taking 0.5 mg qpm to help him relax and sleep  - LORazepam (ATIVAN) 0.5 MG tablet; Take 1 tablet  (0.5 mg total) by mouth at bedtime.  Dispense: 30 tablet; Refill: 2  2. Autism  stable  3. Agitation  Advised to only give medication prn   4. Hyperglycemia  - Hemoglobin A1c  5. Need for hepatitis C screening test  - Hepatitis C antibody  6. Long-term use of high-risk medication  - COMPLETE METABOLIC PANEL WITH GFR - CBC

## 2016-06-07 ENCOUNTER — Other Ambulatory Visit: Payer: Self-pay | Admitting: Family Medicine

## 2016-06-07 DIAGNOSIS — F419 Anxiety disorder, unspecified: Secondary | ICD-10-CM

## 2016-06-07 DIAGNOSIS — F840 Autistic disorder: Secondary | ICD-10-CM

## 2016-06-07 DIAGNOSIS — R4189 Other symptoms and signs involving cognitive functions and awareness: Secondary | ICD-10-CM

## 2016-06-07 DIAGNOSIS — Z79899 Other long term (current) drug therapy: Secondary | ICD-10-CM

## 2016-06-07 LAB — COMPLETE METABOLIC PANEL WITH GFR
ALT: 14 U/L (ref 9–46)
AST: 19 U/L (ref 10–35)
Albumin: 4 g/dL (ref 3.6–5.1)
Alkaline Phosphatase: 57 U/L (ref 40–115)
BUN: 8 mg/dL (ref 7–25)
CALCIUM: 9.7 mg/dL (ref 8.6–10.3)
CHLORIDE: 99 mmol/L (ref 98–110)
CO2: 26 mmol/L (ref 20–31)
Creat: 0.97 mg/dL (ref 0.70–1.33)
GFR, EST NON AFRICAN AMERICAN: 88 mL/min (ref >=60)
Glucose, Bld: 75 mg/dL (ref 65–99)
POTASSIUM: 4.4 mmol/L (ref 3.5–5.3)
Sodium: 136 mmol/L (ref 135–146)
Total Bilirubin: 0.4 mg/dL (ref 0.2–1.2)
Total Protein: 7.7 g/dL (ref 6.1–8.1)

## 2016-06-07 LAB — VALPROIC ACID LEVEL: VALPROIC ACID LVL: 98.3 ug/mL (ref 50.0–100.0)

## 2016-06-07 LAB — HEMOGLOBIN A1C
HEMOGLOBIN A1C: 5.5 % (ref <5.7)
MEAN PLASMA GLUCOSE: 111 mg/dL

## 2016-06-07 LAB — HEPATITIS C ANTIBODY: HCV AB: NEGATIVE

## 2016-07-12 ENCOUNTER — Ambulatory Visit: Payer: Self-pay | Admitting: Family Medicine

## 2016-08-23 ENCOUNTER — Other Ambulatory Visit: Payer: Self-pay

## 2016-08-23 DIAGNOSIS — F419 Anxiety disorder, unspecified: Secondary | ICD-10-CM

## 2016-08-23 NOTE — Telephone Encounter (Signed)
Patient requesting refill of Lorazepam to [facility].  

## 2016-08-30 ENCOUNTER — Telehealth: Payer: Self-pay | Admitting: Family Medicine

## 2016-08-30 NOTE — Telephone Encounter (Signed)
Pt needs refill on Lorazepam. °

## 2016-08-31 ENCOUNTER — Other Ambulatory Visit: Payer: Self-pay | Admitting: Family Medicine

## 2016-08-31 NOTE — Telephone Encounter (Signed)
LM for Dale to call back.

## 2016-08-31 NOTE — Telephone Encounter (Signed)
He needs follow up

## 2016-09-06 ENCOUNTER — Ambulatory Visit (INDEPENDENT_AMBULATORY_CARE_PROVIDER_SITE_OTHER): Payer: Medicare Other | Admitting: Family Medicine

## 2016-09-06 ENCOUNTER — Encounter: Payer: Self-pay | Admitting: Family Medicine

## 2016-09-06 VITALS — BP 122/72 | HR 136 | Temp 98.7°F | Resp 16 | Ht 70.0 in | Wt 188.5 lb

## 2016-09-06 DIAGNOSIS — F419 Anxiety disorder, unspecified: Secondary | ICD-10-CM | POA: Diagnosis not present

## 2016-09-06 DIAGNOSIS — R451 Restlessness and agitation: Secondary | ICD-10-CM | POA: Diagnosis not present

## 2016-09-06 DIAGNOSIS — E782 Mixed hyperlipidemia: Secondary | ICD-10-CM

## 2016-09-06 DIAGNOSIS — Z79899 Other long term (current) drug therapy: Secondary | ICD-10-CM | POA: Diagnosis not present

## 2016-09-06 DIAGNOSIS — R4189 Other symptoms and signs involving cognitive functions and awareness: Secondary | ICD-10-CM

## 2016-09-06 DIAGNOSIS — F840 Autistic disorder: Secondary | ICD-10-CM | POA: Diagnosis not present

## 2016-09-06 DIAGNOSIS — Z23 Encounter for immunization: Secondary | ICD-10-CM

## 2016-09-06 MED ORDER — DIVALPROEX SODIUM 250 MG PO DR TAB: 500.0000 mg | DELAYED_RELEASE_TABLET | Freq: Two times a day (BID) | ORAL | 0 refills | Status: DC

## 2016-09-06 MED ORDER — LORAZEPAM 0.5 MG PO TABS: 0.5000 mg | ORAL_TABLET | Freq: Every day | ORAL | 1 refills | Status: DC | PRN

## 2016-09-06 NOTE — Progress Notes (Signed)
Name: Erik Henderson   MRN: [id number]    DOB: Jul 04, 1961   Date:09/06/2016       Progress Note  Subjective  Chief Complaint  Chief Complaint  Patient presents with  . Autism  . Anxiety    HPI   Agitation/Austims: he is down from Lorazepam 1mg  to 0.5 mg daily as needed for agitation, and seems to be tolerating it well. He has difficulty sleeping even with Risperdal 4 mg qhs . Also takes 2 mg in am. He lives with mother, sister Erik Jupiter( [place] ) that are his caregivers also has two other brothers with disability at home. Sister thinks they can try to give him Lorazepam 0.5  Mg prn only and we will try to decrease amount of pills.   Hyperglycemia: mother has diabetes, he is on diabetic diet, his weight has been stable, no change in appetite, polydipsia or polyuria  Dyslipidemia: LDL was 119, on diet only, no chest pain or SOB  Patient Active Problem List   Diagnosis Date Noted  . Anxiety 04/14/2015  . Cognitive impairment 04/14/2015  . Autism 02/23/2015  . Hay fever 02/21/2008    Past Surgical History:  Procedure Laterality Date  . NO PAST SURGERIES      Family History  Problem Relation Age of Onset  . Hypertension Mother   . Hyperlipidemia Mother   . Hypertension Father   . Coronary artery disease Father   . Hypertension Brother   . Autism Brother   . Mental retardation Brother     Social History   Social History  . Marital status: Single    Spouse name: N/A  . Number of children: N/A  . Years of education: N/A   Occupational History  . Not on file.   Social History Main Topics  . Smoking status: Never Smoker  . Smokeless tobacco: Never Used  . Alcohol use No  . Drug use: No  . Sexual activity: No   Other Topics Concern  . Not on file   Social History Narrative   ** Merged History Encounter **         Current Outpatient Prescriptions:  .  divalproex (DEPAKOTE) 250 MG DR tablet, Take 2 tablets (500 mg total) by mouth 2 (two) times daily. And one at lunch,  changed in August 2017, Disp: 150 tablet, Rfl: 0 .  LORazepam (ATIVAN) 0.5 MG tablet, Take 1 tablet (0.5 mg total) by mouth daily as needed for anxiety. Agitation, Disp: 30 tablet, Rfl: 1 .  risperiDONE (RISPERDAL) 2 MG tablet, Take 1 tablet (2 mg total) by mouth 2 (two) times daily., Disp: 180 tablet, Rfl: 3  No Known Allergies   ROS  Constitutional: Negative for fever or weight change.  Respiratory: Negative for cough and shortness of breath.   Cardiovascular: Negative for chest pain or palpitations.  Gastrointestinal: Negative for abdominal pain, no bowel changes.  Musculoskeletal: Negative for gait problem or joint swelling.  Skin: Negative for rash.  Neurological: Negative for dizziness or headache. Positive for tremors No other specific complaints in a complete review of systems (except as listed in HPI above).  Objective  Vitals:   09/06/16 1333  BP: 122/72  Pulse: (!) 136  Resp: 16  Temp: 98.7 F (37.1 C)  TempSrc: Oral  SpO2: 97%  Weight: 188 lb 8 oz (85.5 kg)  Height: 5\' 10"  (1.778 m)    Body mass index is 27.05 kg/m.  Physical Exam  Constitutional: Patient appears well-developed and overweight   No  distress.  HEENT: head atraumatic, normocephalic, pupils equal and reactive to light, neck supple, throat within normal limits Cardiovascular: Normal rate, regular rhythm and normal heart sounds.  No murmur heard. No BLE edema. Pulmonary/Chest: Effort normal and breath sounds normal. No respiratory distress. Abdominal: Soft.  There is no tenderness. Psychiatric: Patient is calm and cooperative , non-verbal at this time, but he has been humming during visit. Seems happy. Neurological: mild hand tremors at rest  PHQ2/9: [facility] 2/9 06/06/2016 08/17/2015 04/14/2015  Decreased Interest 0 0 0  Down, Depressed, Hopeless 0 0 0  PHQ - 2 Score 0 0 0     Fall Risk: Fall Risk  06/06/2016 08/17/2015 04/14/2015  Falls in the past year? No No No       Assessment & Plan  1. Autism  - divalproex (DEPAKOTE) 250 MG DR tablet; Take 2 tablets (500 mg total) by mouth 2 (two) times daily. And one at lunch, changed in August 2017  Dispense: 150 tablet; Refill: 0 - LORazepam (ATIVAN) 0.5 MG tablet; Take 1 tablet (0.5 mg total) by mouth daily as needed for anxiety. Agitation  Dispense: 30 tablet; Refill: 1  2. Need for influenza vaccination  - Flu Vaccine QUAD 36+ mos PF IM (Fluarix & Fluzone Quad PF)  3. Agitation  - LORazepam (ATIVAN) 0.5 MG tablet; Take 1 tablet (0.5 mg total) by mouth daily as needed for anxiety. Agitation  Dispense: 30 tablet; Refill: 1 - Valproic Acid level  4. Cognitive impairment  - divalproex (DEPAKOTE) 250 MG DR tablet; Take 2 tablets (500 mg total) by mouth 2 (two) times daily. And one at lunch, changed in August 2017  Dispense: 150 tablet; Refill: 0 - Valproic Acid level  5. Mixed hyperlipidemia  Doing well on lifestyle modification   6. Anxiety  Weaning medication off, we will try #30 to last 2 months  7. Long-term use of high-risk medication  - Valproic Acid level

## 2016-09-07 LAB — VALPROIC ACID LEVEL: VALPROIC ACID LVL: 80.1 ug/mL (ref 50.0–100.0)

## 2016-10-08 ENCOUNTER — Other Ambulatory Visit: Payer: Self-pay | Admitting: Family Medicine

## 2016-10-13 ENCOUNTER — Other Ambulatory Visit: Payer: Self-pay

## 2016-10-13 DIAGNOSIS — F840 Autistic disorder: Secondary | ICD-10-CM

## 2016-10-13 DIAGNOSIS — R451 Restlessness and agitation: Secondary | ICD-10-CM

## 2016-10-13 NOTE — Telephone Encounter (Signed)
[facility]

## 2016-11-01 ENCOUNTER — Other Ambulatory Visit: Payer: Self-pay | Admitting: Family Medicine

## 2016-11-01 DIAGNOSIS — F840 Autistic disorder: Secondary | ICD-10-CM

## 2016-11-01 DIAGNOSIS — R4189 Other symptoms and signs involving cognitive functions and awareness: Secondary | ICD-10-CM

## 2016-11-14 ENCOUNTER — Other Ambulatory Visit: Payer: Self-pay

## 2016-11-14 DIAGNOSIS — F840 Autistic disorder: Secondary | ICD-10-CM

## 2016-11-14 DIAGNOSIS — R451 Restlessness and agitation: Secondary | ICD-10-CM

## 2016-11-14 NOTE — Telephone Encounter (Signed)
Patient requesting refill of Lorazepam to [facility].  

## 2016-11-15 ENCOUNTER — Encounter: Payer: Self-pay | Admitting: Family Medicine

## 2016-11-15 ENCOUNTER — Ambulatory Visit (INDEPENDENT_AMBULATORY_CARE_PROVIDER_SITE_OTHER): Payer: Medicare Other | Admitting: Family Medicine

## 2016-11-15 VITALS — BP 128/82 | HR 86 | Temp 98.0°F | Resp 16 | Ht 69.0 in | Wt 185.0 lb

## 2016-11-15 DIAGNOSIS — F419 Anxiety disorder, unspecified: Secondary | ICD-10-CM | POA: Diagnosis not present

## 2016-11-15 DIAGNOSIS — R4189 Other symptoms and signs involving cognitive functions and awareness: Secondary | ICD-10-CM | POA: Diagnosis not present

## 2016-11-15 DIAGNOSIS — Z0000 Encounter for general adult medical examination without abnormal findings: Secondary | ICD-10-CM

## 2016-11-15 DIAGNOSIS — Z0001 Encounter for general adult medical examination with abnormal findings: Secondary | ICD-10-CM

## 2016-11-15 DIAGNOSIS — F840 Autistic disorder: Secondary | ICD-10-CM

## 2016-11-15 MED ORDER — DIVALPROEX SODIUM 250 MG PO DR TAB: 250.0000 mg | DELAYED_RELEASE_TABLET | Freq: Three times a day (TID) | ORAL | 1 refills | Status: DC

## 2016-11-15 NOTE — Progress Notes (Signed)
Name: Erik Henderson   MRN: [id number]    DOB: 06/14/1961   Date:11/15/2016       Progress Note  Subjective  Chief Complaint  Chief Complaint  Patient presents with  . Annual Exam    HPI  Functional ability/safety issues:disabled  Hearing issues: Addressed  Activities of daily living:cannot self medicate, needs assistance with bathing Home safety issues: No Issues  End Of Life Planning: Offered verbal information regarding advanced directives, healthcare power of attorney.  Preventative care, Health maintenance, Preventative health measures discussed.  Preventative screenings discussed today: lab work, colonoscopy - discussed with brother - agreed on not having colonoscopy or  PSA.  Men age 67 to 31 years if ever smoked recommended to get a one time AAA ultrasound screening exam.  Low Dose CT Chest recommended if Age 90-80 years, 30 pack-year currently smoking OR have quit w/in 15years.   Lifestyle risk factor issued reviewed: Diet, exercise, weight management, advised patient smoking is not healthy, nutrition/diet.  Preventative health measures discussed (5-10 year plan).  Reviewed and recommended vaccinations: - Pneumovax  - Prevnar  - Annual Influenza - Zostavax - Tdap   Depression screening: Done - but answered by brother Fall risk screening: Done Discuss ADLs/IADLs: see above Current medical providers: See HPI  Other health risk factors identified this visit: No other issues Cognitive impairment issues: yes, but stable  All above discussed with patient. Appropriate education, counseling and referral will be made based upon the above.   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   Patient Active Problem List   Diagnosis Date Noted  . Anxiety  04/14/2015  . Cognitive impairment 04/14/2015  . Autism 02/23/2015  . Hay fever 02/21/2008    Past Surgical History:  Procedure Laterality Date  . NO PAST SURGERIES      Family History  Problem Relation Age of Onset  . Hypertension Mother   . Hyperlipidemia Mother   . Hypertension Father   . Coronary artery disease Father   . Hypertension Brother   . Autism Brother   . Mental retardation Brother     Social History   Social History  . Marital status: Single    Spouse name: N/A  . Number of children: N/A  . Years of education: N/A   Occupational History  . Not on file.   Social History Main Topics  . Smoking status: Never Smoker  . Smokeless tobacco: Never Used  . Alcohol use No  . Drug use: No  . Sexual activity: No   Other Topics Concern  . Not on file   Social History Narrative   ** Merged History Encounter **         Current Outpatient Prescriptions:  .  divalproex (DEPAKOTE) 250 MG DR tablet, Take 1 tablet (250 mg total) by mouth 3 (three) times daily. Take one in am, one at lunch and 2 pam, Disp: 120 tablet, Rfl: 1 .  LORazepam (ATIVAN) 0.5 MG tablet, Take 1 tablet (0.5 mg total) by mouth daily as needed for anxiety. Agitation, Disp: 30 tablet, Rfl: 1 .  risperiDONE (RISPERDAL) 2 MG tablet, Take 1 tablet (2 mg total) by mouth 2 (two) times daily., Disp: 180 tablet, Rfl: 3  No Known Allergies   ROS  Constitutional:  Negative for fever, positive for weight change.  Respiratory: Negative for cough and shortness of breath.   Cardiovascular: Negative for chest pain or palpitations.  Gastrointestinal: Negative for abdominal pain, no bowel changes.  Musculoskeletal: Negative for gait problem or joint swelling.  Skin: Negative for rash.  Neurological: Negative for dizziness or headache.  No other specific complaints in a complete review of systems (except as listed in HPI above).  Objective  Vitals:   11/15/16 1530  BP: 128/82  Pulse: 86  Resp: 16   Temp: 98 F (36.7 C)  SpO2: 98%  Weight: 185 lb (83.9 kg)  Height: 5\' 9"  (1.753 m)    Body mass index is 27.32 kg/m.  Physical Exam  Constitutional: Patient appears well-developed and overweight . No distress.  HENT: Head: Normocephalic and atraumatic. Ears: B TMs ok, no erythema or effusion; Nose: Nose normal. Mouth/Throat: Oropharynx is clear and moist. No oropharyngeal exudate.  Eyes: Conjunctivae and EOM are normal. Pupils are equal, round, and reactive to light. No scleral icterus.  Neck: Normal range of motion. Neck supple. No JVD present. No thyromegaly present.  Cardiovascular: Normal rate, regular rhythm and normal heart sounds.  No murmur heard. No BLE edema. Pulmonary/Chest: Effort normal and breath sounds normal. No respiratory distress. Abdominal: Soft. Bowel sounds are normal, no distension. There is no tenderness. no masses MALE GENITALIA: not done RECTAL: not done Musculoskeletal: Normal range of motion, no joint effusions. No gross deformities Neurological: he is alert and oriented to person, place, and time. No cranial nerve deficit. Coordination, balance, strength, speech and gait are normal.  Skin: Skin is warm and dry. No rash noted. No erythema.  Psychiatric: cooperative, humming during the visit, seems happy   Recent Results (from the past 2160 hour(s))  Valproic Acid level     Status: None   Collection Time: 09/06/16  2:17 PM  Result Value Ref Range   Valproic Acid Lvl 80.1 50.0 - 100.0 ug/mL      PHQ2/9: Depression screen [facility] 2/9 06/06/2016 08/17/2015 04/14/2015  Decreased Interest 0 0 0  Down, Depressed, Hopeless 0 0 0  PHQ - 2 Score 0 0 0    Fall Risk: Fall Risk  11/15/2016 06/06/2016 08/17/2015 04/14/2015  Falls in the past year? No No No No     Functional Status Survey: Is the patient deaf or have difficulty hearing?: No Does the patient have difficulty seeing, even when wearing glasses/contacts?: No Does the patient have difficulty  concentrating, remembering, or making decisions?: Yes Does the patient have difficulty walking or climbing stairs?: No Does the patient have difficulty dressing or bathing?: Yes Does the patient have difficulty doing errands alone such as visiting a doctor's office or shopping?: Yes    Assessment & Plan  1. Medicare annual wellness visit, subsequent  Discussed importance of 150 minutes of physical activity weekly, eat two servings of fish weekly, eat one serving of tree nuts ( cashews, pistachios, pecans, almonds.Marland Kitchen) every other day, eat 6 servings of fruit/vegetables daily and drink plenty of water and avoid sweet beverages.   2. Autism  - divalproex (DEPAKOTE) 250 MG DR tablet; Take 1 tablet (250 mg total) by mouth 3 (three) times daily. Take one in am, one at lunch and 2 pam  Dispense: 120 tablet; Refill: 1  3. Cognitive impairment  - divalproex (DEPAKOTE) 250 MG DR tablet; Take 1 tablet (250 mg total) by mouth 3 (three) times daily. Take one in am, one at lunch and 2 pam  Dispense: 120 tablet; Refill: 1  4. Anxiety  Weaning medication

## 2016-12-06 ENCOUNTER — Other Ambulatory Visit: Payer: Self-pay

## 2016-12-06 DIAGNOSIS — F840 Autistic disorder: Secondary | ICD-10-CM

## 2016-12-06 DIAGNOSIS — R451 Restlessness and agitation: Secondary | ICD-10-CM

## 2016-12-06 NOTE — Telephone Encounter (Signed)
Patient requesting refill of Lorazepam to [facility].  

## 2016-12-14 ENCOUNTER — Other Ambulatory Visit: Payer: Self-pay | Admitting: Family Medicine

## 2016-12-14 DIAGNOSIS — R451 Restlessness and agitation: Secondary | ICD-10-CM

## 2016-12-14 DIAGNOSIS — F840 Autistic disorder: Secondary | ICD-10-CM

## 2016-12-14 NOTE — Telephone Encounter (Signed)
Pt has appointment for Monday but is completely out of Lorazepam. [facility]

## 2016-12-15 MED ORDER — LORAZEPAM 0.5 MG PO TABS: 0.5000 mg | ORAL_TABLET | Freq: Every day | ORAL | 0 refills | Status: DC | PRN

## 2016-12-19 ENCOUNTER — Encounter: Payer: Self-pay | Admitting: Family Medicine

## 2016-12-19 ENCOUNTER — Ambulatory Visit (INDEPENDENT_AMBULATORY_CARE_PROVIDER_SITE_OTHER): Payer: Medicare Other | Admitting: Family Medicine

## 2016-12-19 VITALS — BP 122/82 | HR 92 | Temp 99.2°F | Resp 18 | Ht 69.0 in | Wt 180.1 lb

## 2016-12-19 DIAGNOSIS — F840 Autistic disorder: Secondary | ICD-10-CM | POA: Diagnosis not present

## 2016-12-19 DIAGNOSIS — R4189 Other symptoms and signs involving cognitive functions and awareness: Secondary | ICD-10-CM

## 2016-12-19 DIAGNOSIS — E782 Mixed hyperlipidemia: Secondary | ICD-10-CM | POA: Diagnosis not present

## 2016-12-19 DIAGNOSIS — J301 Allergic rhinitis due to pollen: Secondary | ICD-10-CM

## 2016-12-19 DIAGNOSIS — Z79899 Other long term (current) drug therapy: Secondary | ICD-10-CM

## 2016-12-19 DIAGNOSIS — R569 Unspecified convulsions: Secondary | ICD-10-CM

## 2016-12-19 DIAGNOSIS — E663 Overweight: Secondary | ICD-10-CM | POA: Diagnosis not present

## 2016-12-19 LAB — CBC WITH DIFFERENTIAL/PLATELET
BASOS ABS: 0 {cells}/uL (ref 0–200)
Basophils Relative: 0 %
EOS ABS: 0 {cells}/uL — AB (ref 15–500)
Eosinophils Relative: 0 %
HCT: 41.3 % (ref 38.5–50.0)
Hemoglobin: 13.6 g/dL (ref 13.2–17.1)
Lymphocytes Relative: 35 %
Lymphs Abs: 1960 cells/uL (ref 850–3900)
MCH: 29.1 pg (ref 27.0–33.0)
MCHC: 32.9 g/dL (ref 32.0–36.0)
MCV: 88.4 fL (ref 80.0–100.0)
MONOS PCT: 11 %
MPV: 9.5 fL (ref 7.5–12.5)
Monocytes Absolute: 616 cells/uL (ref 200–950)
NEUTROS ABS: 3024 {cells}/uL (ref 1500–7800)
NEUTROS PCT: 54 %
PLATELETS: 223 10*3/uL (ref 140–400)
RBC: 4.67 MIL/uL (ref 4.20–5.80)
RDW: 13.6 % (ref 11.0–15.0)
WBC: 5.6 10*3/uL (ref 3.8–10.8)

## 2016-12-19 MED ORDER — DIVALPROEX SODIUM 250 MG PO DR TAB: 250.0000 mg | DELAYED_RELEASE_TABLET | Freq: Three times a day (TID) | ORAL | 2 refills | Status: DC

## 2016-12-19 NOTE — Progress Notes (Signed)
Name: Erik Henderson   MRN: [id number]    DOB: 01-15-1961   Date:12/19/2016       Progress Note  Subjective  Chief Complaint  Chief Complaint  Patient presents with  . Medication Refill    1 month F/U  . Autism    Needs refill of medication, mother has been cooking healthier and patient has lose 5 pounds since last visit    HPI  Agitation/Austims/History of seizures: he is down from Lorazepam from 1mg  to 0.5 mg  as needed for agitation, and seems to be tolerating it well, last rx lasted him 2 months for 60 pills, advised to try making 60 pills last 3 months.. He is doing well in terms of sleep, no longer getting agitated before bedtime. Also takes 2 mg in am. He lives with mother, sister [name] ) that are his caregivers also has two other brothers with disability at home.   Hyperglycemia: mother has diabetes, he is on diabetic diet, his weight has been stable, no change in appetite, polydipsia or polyuria. Caregivers have changed his diet and he has lost 5 lbs since last visit, avoiding desserts, eating more fruit and vegetables  Dyslipidemia: LDL was 119, on diet only, no chest pain or SOB, he is fasting and we will check labs today   AR: currently doing well, no rhinorrhea, congestion of sneezing.   Patient Active Problem List   Diagnosis Date Noted  . Anxiety 04/14/2015  . Cognitive impairment 04/14/2015  . Autism 02/23/2015  . Hay fever 02/21/2008    Past Surgical History:  Procedure Laterality Date  . NO PAST SURGERIES      Family History  Problem Relation Age of Onset  . Hypertension Mother   . Hyperlipidemia Mother   . Hypertension Father   . Coronary artery disease Father   . Hypertension Brother   . Autism Brother   . Mental retardation Brother     Social History   Social History  . Marital status: Single    Spouse name: N/A  . Number of children: N/A  . Years of education: N/A   Occupational History  . Not on file.   Social History Main Topics  .  Smoking status: Never Smoker  . Smokeless tobacco: Never Used  . Alcohol use No  . Drug use: No  . Sexual activity: No   Other Topics Concern  . Not on file   Social History Narrative   ** Merged History Encounter **         Current Outpatient Prescriptions:  .  divalproex (DEPAKOTE) 250 MG DR tablet, Take 1 tablet (250 mg total) by mouth 3 (three) times daily. Take one in am, one at lunch and 2 pam, Disp: 120 tablet, Rfl: 2 .  LORazepam (ATIVAN) 0.5 MG tablet, Take 1 tablet (0.5 mg total) by mouth daily as needed for anxiety. Agitation, Disp: 30 tablet, Rfl: 0 .  risperiDONE (RISPERDAL) 2 MG tablet, Take 1 tablet (2 mg total) by mouth 2 (two) times daily., Disp: 180 tablet, Rfl: 3  No Known Allergies   ROS  Constitutional: Negative for fever, positive for weight change.  Respiratory: Negative for cough and shortness of breath.   Cardiovascular: Negative for chest pain or palpitations.  Gastrointestinal: Negative for abdominal pain, no bowel changes.  Musculoskeletal: Negative for gait problem or joint swelling.  Skin: Negative for rash.  Neurological: Negative for dizziness or headache.  No other specific complaints in a complete review of systems (except  as listed in HPI above).  Objective  Vitals:   12/19/16 1112  BP: 122/82  Pulse: 92  Resp: 18  Temp: 99.2 F (37.3 C)  TempSrc: Oral  SpO2: 92%  Weight: 180 lb 1.6 oz (81.7 kg)  Height: 5\' 9"  (1.753 m)    Body mass index is 26.6 kg/m.  Physical Exam  Constitutional: Patient appears well-developed and overweight  No distress.  HEENT: head atraumatic, normocephalic, pupils equal and reactive to light, neck supple, throat within normal limits Cardiovascular: Normal rate, regular rhythm and normal heart sounds. No murmur heard. No BLE edema. Pulmonary/Chest: Effort normal and breath sounds normal. No respiratory distress. Abdominal: Soft. There is no tenderness. Psychiatric: Patient is calm and cooperative  , non-verbal at this time, but he has been humming during visit. Neurological: mild hand tremors at rest  PHQ2/9: Depression screen [facility] 2/9 06/06/2016 08/17/2015 04/14/2015  Decreased Interest 0 0 0  Down, Depressed, Hopeless 0 0 0  PHQ - 2 Score 0 0 0    Fall Risk: Fall Risk  11/15/2016 06/06/2016 08/17/2015 04/14/2015  Falls in the past year? No No No No     Assessment & Plan    1. Mixed hyperlipidemia  - Lipid panel - COMPLETE METABOLIC PANEL WITH GFR  2. Cognitive impairment  Stable, lives at home with family  3. Active autistic disorder  - divalproex (DEPAKOTE) 250 MG DR tablet; Take 1 tablet (250 mg total) by mouth 3 (three) times daily. Take one in am, one at lunch and 2 pam  Dispense: 120 tablet; Refill: 2  4. Seizure (HCC)  No seizures in years - divalproex (DEPAKOTE) 250 MG DR tablet; Take 1 tablet (250 mg total) by mouth 3 (three) times daily. Take one in am, one at lunch and 2 pam  Dispense: 120 tablet; Refill: 2 - Valproic Acid level  5. Hay fever  He takes otc Claritin prn, but no symptoms now   6. Overweight (BMI 25.0-29.9)  - Insulin, fasting  7. Long-term use of high-risk medication  - CBC with Differential/Platelet

## 2016-12-20 LAB — INSULIN, FASTING: INSULIN FASTING, SERUM: 17.8 u[IU]/mL (ref 2.0–19.6)

## 2016-12-20 LAB — COMPLETE METABOLIC PANEL WITH GFR
ALT: 10 U/L (ref 9–46)
AST: 15 U/L (ref 10–35)
Albumin: 4.1 g/dL (ref 3.6–5.1)
Alkaline Phosphatase: 52 U/L (ref 40–115)
BILIRUBIN TOTAL: 0.5 mg/dL (ref 0.2–1.2)
BUN: 7 mg/dL (ref 7–25)
CO2: 28 mmol/L (ref 20–31)
CREATININE: 0.95 mg/dL (ref 0.70–1.33)
Calcium: 9.6 mg/dL (ref 8.6–10.3)
Chloride: 101 mmol/L (ref 98–110)
GFR, Est African American: >89 mL/min (ref >=60)
GFR, Est Non African American: >89 mL/min (ref >=60)
GLUCOSE: 85 mg/dL (ref 65–99)
Potassium: 4.2 mmol/L (ref 3.5–5.3)
SODIUM: 138 mmol/L (ref 135–146)
TOTAL PROTEIN: 7.8 g/dL (ref 6.1–8.1)

## 2016-12-20 LAB — LIPID PANEL
Cholesterol: 189 mg/dL (ref <200)
HDL: 55 mg/dL (ref >40)
LDL CALC: 114 mg/dL — AB (ref <100)
TRIGLYCERIDES: 100 mg/dL (ref <150)
Total CHOL/HDL Ratio: 3.4 Ratio (ref <5.0)
VLDL: 20 mg/dL (ref <30)

## 2016-12-20 LAB — VALPROIC ACID LEVEL: Valproic Acid Lvl: 54.8 ug/mL (ref 50.0–100.0)

## 2017-01-06 ENCOUNTER — Ambulatory Visit: Payer: Self-pay | Admitting: Family Medicine

## 2017-01-09 ENCOUNTER — Other Ambulatory Visit: Payer: Self-pay | Admitting: Family Medicine

## 2017-01-13 ENCOUNTER — Other Ambulatory Visit: Payer: Self-pay | Admitting: Family Medicine

## 2017-01-13 DIAGNOSIS — R451 Restlessness and agitation: Secondary | ICD-10-CM

## 2017-01-13 DIAGNOSIS — F840 Autistic disorder: Secondary | ICD-10-CM

## 2017-01-13 NOTE — Telephone Encounter (Signed)
Pt needs refill on Lorazepam. °

## 2017-01-17 ENCOUNTER — Telehealth: Payer: Self-pay | Admitting: Family Medicine

## 2017-01-17 NOTE — Telephone Encounter (Signed)
Requesting refill on lorazepam. She was informed that the last script was suppose to last 2 months but she then stated that some days his behavior is worse than others therefore she has to give him the medication. [facility] He is completely out. The pharmacy did give him 2 pills on yesterday. [phone]

## 2017-01-18 NOTE — Telephone Encounter (Signed)
I am sorry, controlled medication. Too soon

## 2017-01-20 NOTE — Telephone Encounter (Signed)
Patient caregiver has been notified this medication is supposed to be as needed, and will not be filled for at least another week.

## 2017-01-31 ENCOUNTER — Telehealth: Payer: Self-pay | Admitting: Family Medicine

## 2017-01-31 NOTE — Telephone Encounter (Signed)
These will have to be approved by Dr. Carlynn Purl so they will have to wait for her to get back to ok refills

## 2017-01-31 NOTE — Telephone Encounter (Signed)
Requesting refill on lorazepam. Asking that you send to [facility]. Stated that he has been out for some time now. Was informed Dr Carlynn Purl was out of the office but we will ask. [phone]

## 2017-01-31 NOTE — Telephone Encounter (Signed)
Erik Henderson (sister) verbally informed.

## 2017-02-09 ENCOUNTER — Telehealth: Payer: Self-pay | Admitting: Family Medicine

## 2017-02-09 ENCOUNTER — Other Ambulatory Visit: Payer: Self-pay | Admitting: Family Medicine

## 2017-02-09 DIAGNOSIS — R451 Restlessness and agitation: Secondary | ICD-10-CM

## 2017-02-09 DIAGNOSIS — F840 Autistic disorder: Secondary | ICD-10-CM

## 2017-02-09 MED ORDER — LORAZEPAM 0.5 MG PO TABS: 0.5000 mg | ORAL_TABLET | Freq: Every day | ORAL | 0 refills | Status: DC | PRN

## 2017-02-09 NOTE — Telephone Encounter (Signed)
[name] (sister) requesting refill on lorazepam asking that you send to [facility]. He is completely out [phone]

## 2017-03-20 ENCOUNTER — Ambulatory Visit (INDEPENDENT_AMBULATORY_CARE_PROVIDER_SITE_OTHER): Payer: Medicare Other | Admitting: Family Medicine

## 2017-03-20 VITALS — BP 118/78 | HR 88 | Temp 98.4°F | Resp 18 | Ht 69.0 in | Wt 183.5 lb

## 2017-03-20 DIAGNOSIS — R4189 Other symptoms and signs involving cognitive functions and awareness: Secondary | ICD-10-CM

## 2017-03-20 DIAGNOSIS — J301 Allergic rhinitis due to pollen: Secondary | ICD-10-CM | POA: Diagnosis not present

## 2017-03-20 DIAGNOSIS — F419 Anxiety disorder, unspecified: Secondary | ICD-10-CM

## 2017-03-20 DIAGNOSIS — F840 Autistic disorder: Secondary | ICD-10-CM

## 2017-03-20 DIAGNOSIS — R451 Restlessness and agitation: Secondary | ICD-10-CM | POA: Diagnosis not present

## 2017-03-20 MED ORDER — LORAZEPAM 0.5 MG PO TABS: 0.5000 mg | ORAL_TABLET | Freq: Every day | ORAL | 0 refills | Status: DC | PRN

## 2017-03-20 MED ORDER — FEXOFENADINE HCL 180 MG PO TABS: 180.0000 mg | ORAL_TABLET | Freq: Every day | ORAL | 0 refills | Status: DC

## 2017-03-20 MED ORDER — RISPERIDONE 2 MG PO TABS: 2.0000 mg | ORAL_TABLET | Freq: Two times a day (BID) | ORAL | 3 refills | Status: DC

## 2017-03-20 NOTE — Progress Notes (Signed)
Name: Erik Henderson   MRN: [id number]    DOB: 30-Nov-1960   Date:03/20/2017       Progress Note  Subjective  Chief Complaint  Chief Complaint  Patient presents with  . Hyperlipidemia    3 month follow up  . cognitive impairment    HPI  Agitation/Austims: he is down from Lorazepam from 1mg  to 0.5 mg  as needed for agitation, and seems to be tolerating it well, we will decrease to 45 pills every 3 months. He is doing well in terms of sleep, no longer getting agitated before bedtime. Also takes 2 mg Risperdal in am. He lives with mother, sister Erik Henderson ) that are his caregivers also has two other brothers with disability at home.   Insulin Resistance: mother has diabetes, he is on diabetic diet, his weight has been stable, no change in appetite, polydipsia or polyuria. Last insulin level was elevated  AR: currently doing well, no rhinorrhea, congestion of sneezing.   Patient Active Problem List   Diagnosis Date Noted  . Anxiety 04/14/2015  . Cognitive impairment 04/14/2015  . Autism 02/23/2015  . Hay fever 02/21/2008    Past Surgical History:  Procedure Laterality Date  . NO PAST SURGERIES      Family History  Problem Relation Age of Onset  . Hypertension Mother   . Hyperlipidemia Mother   . Hypertension Father   . Coronary artery disease Father   . Hypertension Brother   . Autism Brother   . Mental retardation Brother     Social History   Social History  . Marital status: Single    Spouse name: N/A  . Number of children: N/A  . Years of education: N/A   Occupational History  . Not on file.   Social History Main Topics  . Smoking status: Never Smoker  . Smokeless tobacco: Never Used  . Alcohol use No  . Drug use: No  . Sexual activity: No   Other Topics Concern  . Not on file   Social History Narrative   ** Merged History Encounter **         Current Outpatient Prescriptions:  .  divalproex (DEPAKOTE) 250 MG DR tablet, Take 1 tablet (250 mg total)  by mouth 3 (three) times daily. Take one in am, one at lunch and 2 pam, Disp: 120 tablet, Rfl: 2 .  LORazepam (ATIVAN) 0.5 MG tablet, Take 1 tablet (0.5 mg total) by mouth daily as needed for anxiety. Agitation, Disp: 30 tablet, Rfl: 0 .  risperiDONE (RISPERDAL) 2 MG tablet, Take 1 tablet (2 mg total) by mouth 2 (two) times daily., Disp: 180 tablet, Rfl: 3  No Known Allergies   ROS  Constitutional: Negative for fever or weight change.  Respiratory: Negative for cough and shortness of breath.   Cardiovascular: Negative for chest pain or palpitations.  Gastrointestinal: Negative for abdominal pain, no bowel changes.  Musculoskeletal: Negative for gait problem (only walks funny when he goes to the medical office, he runs at home)  No  joint swelling.  Skin: Negative for rash.  Neurological: Negative for dizziness or headache.  No other specific complaints in a complete review of systems (except as listed in HPI above).  Objective  Vitals:   03/20/17 1144  BP: 118/78  Pulse: 88  Resp: 18  Temp: 98.4 F (36.9 C)  SpO2: 97%  Weight: 183 lb 8 oz (83.2 kg)  Height: 5\' 9"  (1.753 m)    Body mass index is 27.1  kg/m.  Physical Exam  Constitutional: Patient appears well-developed and overweightNo distress.  HEENT: head atraumatic, normocephalic, pupils equal and reactive to light, neck supple, throat within normal limits Cardiovascular: Normal rate, regular rhythm and normal heart sounds. No murmur heard. No BLE edema. Pulmonary/Chest: Effort normal and breath sounds normal. No respiratory distress. Abdominal: Soft. There is no tenderness. Muscular Skeletal: walking with assistance, small steps Psychiatric: Patient is calm and cooperative , non-verbal at this time, but he has been humming during visit. Neurological: mild hand tremors at rest  PHQ2/9: [facility] screen [facility] 2/9 06/06/2016 08/17/2015 04/14/2015  Decreased Interest 0 0 0  Down, Depressed, Hopeless 0 0 0  PHQ - 2  Score 0 0 0     Fall Risk: Fall Risk  11/15/2016 06/06/2016 08/17/2015 04/14/2015  Falls in the past year? No No No No      Assessment & Plan  1. Cognitive impairment  stable  2. Active autistic disorder  He takes Risperdal and Depakote for behavior, per sister Erik Henderson( [place] ) she never had seizures in the past - risperiDONE (RISPERDAL) 2 MG tablet; Take 1 tablet (2 mg total) by mouth 2 (two) times daily.  Dispense: 180 tablet; Refill: 3  3. Anxiety  Taking medication prn only   - LORazepam (ATIVAN) 0.5 MG tablet; Take 1 tablet (0.5 mg total) by mouth daily as needed for anxiety. Agitation  Dispense: 45 tablet; Refill: 0   4. Hay fever  - fexofenadine (ALLEGRA) 180 MG tablet; Take 1 tablet (180 mg total) by mouth daily.  Dispense: 30 tablet; Refill: 0

## 2017-04-17 ENCOUNTER — Other Ambulatory Visit: Payer: Self-pay | Admitting: Family Medicine

## 2017-04-17 DIAGNOSIS — F840 Autistic disorder: Secondary | ICD-10-CM

## 2017-04-17 DIAGNOSIS — R569 Unspecified convulsions: Secondary | ICD-10-CM

## 2017-04-17 NOTE — Telephone Encounter (Signed)
Pharm[facility]

## 2017-05-20 ENCOUNTER — Other Ambulatory Visit: Payer: Self-pay | Admitting: Family Medicine

## 2017-05-20 DIAGNOSIS — R569 Unspecified convulsions: Secondary | ICD-10-CM

## 2017-05-20 DIAGNOSIS — F840 Autistic disorder: Secondary | ICD-10-CM

## 2017-06-05 ENCOUNTER — Other Ambulatory Visit: Payer: Self-pay

## 2017-06-05 DIAGNOSIS — F840 Autistic disorder: Secondary | ICD-10-CM

## 2017-06-05 DIAGNOSIS — R451 Restlessness and agitation: Secondary | ICD-10-CM

## 2017-06-09 ENCOUNTER — Ambulatory Visit (INDEPENDENT_AMBULATORY_CARE_PROVIDER_SITE_OTHER): Payer: Medicare Other | Admitting: Family Medicine

## 2017-06-09 ENCOUNTER — Encounter: Payer: Self-pay | Admitting: Family Medicine

## 2017-06-09 VITALS — BP 114/68 | HR 80 | Temp 98.3°F | Resp 16 | Wt 182.7 lb

## 2017-06-09 DIAGNOSIS — R4189 Other symptoms and signs involving cognitive functions and awareness: Secondary | ICD-10-CM

## 2017-06-09 DIAGNOSIS — E782 Mixed hyperlipidemia: Secondary | ICD-10-CM

## 2017-06-09 DIAGNOSIS — R350 Frequency of micturition: Secondary | ICD-10-CM

## 2017-06-09 DIAGNOSIS — Z79899 Other long term (current) drug therapy: Secondary | ICD-10-CM | POA: Diagnosis not present

## 2017-06-09 DIAGNOSIS — F419 Anxiety disorder, unspecified: Secondary | ICD-10-CM

## 2017-06-09 DIAGNOSIS — Z1211 Encounter for screening for malignant neoplasm of colon: Secondary | ICD-10-CM

## 2017-06-09 DIAGNOSIS — R569 Unspecified convulsions: Secondary | ICD-10-CM | POA: Diagnosis not present

## 2017-06-09 DIAGNOSIS — R739 Hyperglycemia, unspecified: Secondary | ICD-10-CM

## 2017-06-09 DIAGNOSIS — R451 Restlessness and agitation: Secondary | ICD-10-CM | POA: Diagnosis not present

## 2017-06-09 DIAGNOSIS — F840 Autistic disorder: Secondary | ICD-10-CM | POA: Diagnosis not present

## 2017-06-09 LAB — CBC WITH DIFFERENTIAL/PLATELET
BASOS ABS: 0 {cells}/uL (ref 0–200)
Basophils Relative: 0 %
EOS ABS: 0 {cells}/uL — AB (ref 15–500)
EOS PCT: 0 %
HCT: 41.8 % (ref 38.5–50.0)
HEMOGLOBIN: 13.6 g/dL (ref 13.2–17.1)
LYMPHS ABS: 1764 {cells}/uL (ref 850–3900)
Lymphocytes Relative: 36 %
MCH: 29.1 pg (ref 27.0–33.0)
MCHC: 32.5 g/dL (ref 32.0–36.0)
MCV: 89.5 fL (ref 80.0–100.0)
MPV: 9.4 fL (ref 7.5–12.5)
Monocytes Absolute: 588 cells/uL (ref 200–950)
Monocytes Relative: 12 %
NEUTROS PCT: 52 %
Neutro Abs: 2548 cells/uL (ref 1500–7800)
Platelets: 232 10*3/uL (ref 140–400)
RBC: 4.67 MIL/uL (ref 4.20–5.80)
RDW: 13.4 % (ref 11.0–15.0)
WBC: 4.9 10*3/uL (ref 3.8–10.8)

## 2017-06-09 LAB — POCT UA - GLUCOSE/PROTEIN
GLUCOSE UA: NEGATIVE
PROTEIN UA: NEGATIVE

## 2017-06-09 MED ORDER — DIVALPROEX SODIUM 250 MG PO DR TAB: DELAYED_RELEASE_TABLET | ORAL | 1 refills | Status: DC

## 2017-06-09 MED ORDER — LORAZEPAM 0.5 MG PO TABS: 0.5000 mg | ORAL_TABLET | Freq: Every day | ORAL | 0 refills | Status: DC | PRN

## 2017-06-09 NOTE — Progress Notes (Signed)
Name: Erik Henderson   MRN: [id number]    DOB: 07-07-1961   Date:06/09/2017       Progress Note  Subjective  Chief Complaint  Chief Complaint  Patient presents with  . Medication Refill  . Cognitive impairment  . Urinary Frequency    HPI  Agitation/Austims: he is down from Lorazepam from 1mg  to 0.5 mg as needed for agitation, and seems to be tolerating it well, we will decrease to 45 pills every 3 months. He is doing well in terms of sleep, no longer getting agitated before bedtime. Also takes 2 mg Risperdal in am. He lives with mother, sister [name] ) that are his caregivers also has two other brothers with disability at home.    Seizure disorder: caregivers states he has been seizure free for many years, but they are afraid of stopping medication. Dose has been adjusted and last valproic acid level was better.   Insulin Resistance: mother has diabetes, he is on diabetic diet, his weight has been stable, no change in appetite, however over the past couple of months he has been having polydipsia and  polyuria. Last insulin level was elevated  AR: currently doing well, no rhinorrhea, congestion or sneezing.   Urinary frequency: caregiver states that he has been drinking more water and having urinary frequency over the past 2 weeks, he is not having fever or chills, appetite is normal, but he also been drinking more water - he goes to the bathroom and drinks when nobody is around him. No incontinence and does not seem to be in pain.    Patient Active Problem List   Diagnosis Date Noted  . Anxiety 04/14/2015  . Cognitive impairment 04/14/2015  . Autism 02/23/2015  . Hay fever 02/21/2008    Past Surgical History:  Procedure Laterality Date  . NO PAST SURGERIES      Family History  Problem Relation Age of Onset  . Hypertension Mother   . Hyperlipidemia Mother   . Hypertension Father   . Coronary artery disease Father   . Hypertension Brother   . Autism Brother   . Mental  retardation Brother     Social History   Social History  . Marital status: Single    Spouse name: N/A  . Number of children: N/A  . Years of education: N/A   Occupational History  . Not on file.   Social History Main Topics  . Smoking status: Never Smoker  . Smokeless tobacco: Never Used  . Alcohol use No  . Drug use: No  . Sexual activity: No   Other Topics Concern  . Not on file   Social History Narrative   Lives with mother, brothers and sisters.    On disability since birth         Current Outpatient Prescriptions:  .  divalproex (DEPAKOTE) 250 MG DR tablet, TAKE 1 TABLET TWICE DAILY AND 2 TABLETS IN THE EVENING, Disp: 270 tablet, Rfl: 1 .  LORazepam (ATIVAN) 0.5 MG tablet, Take 1 tablet (0.5 mg total) by mouth daily as needed for anxiety. Agitation, Disp: 45 tablet, Rfl: 0 .  risperiDONE (RISPERDAL) 2 MG tablet, Take 1 tablet (2 mg total) by mouth 2 (two) times daily., Disp: 180 tablet, Rfl: 3 .  fexofenadine (ALLEGRA) 180 MG tablet, Take 1 tablet (180 mg total) by mouth daily. (Patient not taking: Reported on 06/09/2017), Disp: 30 tablet, Rfl: 0  No Known Allergies   ROS  Constitutional: Negative for fever or weight change.  Respiratory: Negative for cough and shortness of breath.   Cardiovascular: Negative for chest pain or palpitations.  Gastrointestinal: Negative for abdominal pain, no bowel changes.  Musculoskeletal: Negative for gait problem or joint swelling.  Skin: Negative for rash.  Neurological: Negative for dizziness or headache.  No other specific complaints in a complete review of systems (except as listed in HPI above).  Objective  Vitals:   06/09/17 1035  BP: 114/68  Pulse: 80  Resp: 16  Temp: 98.3 F (36.8 C)  TempSrc: Oral  SpO2: 98%  Weight: 182 lb 11.2 oz (82.9 kg)    Body mass index is 26.98 kg/m.  Physical Exam  Constitutional: Patient appears well-developed and well-nourished. Overweight.  No distress.  HEENT: head  atraumatic, normocephalic, pupils equal and reactive to light, neck supple, throat within normal limits Cardiovascular: Normal rate, regular rhythm and normal heart sounds.  No murmur heard. No BLE edema. Pulmonary/Chest: Effort normal and breath sounds normal. No respiratory distress. Abdominal: Soft.  There is no tenderness. Psychiatric: Patient has a normal mood and affect. He is humming during his visit and moving his trunk back and forth.   PHQ2/9: [facility] 06/09/2017 06/06/2016 08/17/2015 04/14/2015  Decreased Interest 0 0 0 0  Down, Depressed, Hopeless 0 0 0 0  PHQ - 2 Score 0 0 0 0     Fall Risk: Fall Risk  06/09/2017 11/15/2016 06/06/2016 08/17/2015 04/14/2015  Falls in the past year? No No No No No     Functional Status Survey: Is the patient deaf or have difficulty hearing?: No Does the patient have difficulty seeing, even when wearing glasses/contacts?: No Does the patient have difficulty concentrating, remembering, or making decisions?: Yes Does the patient have difficulty walking or climbing stairs?: No Does the patient have difficulty dressing or bathing?: Yes Does the patient have difficulty doing errands alone such as visiting a doctor's office or shopping?: Yes   Assessment & Plan  1. Seizure (HCC)  - divalproex (DEPAKOTE) 250 MG DR tablet; TAKE 1 TABLET TWICE DAILY AND 2 TABLETS IN THE EVENING  Dispense: 270 tablet; Refill: 1 - Valproic Acid level  2. Cognitive impairment   3. Active autistic disorder  - LORazepam (ATIVAN) 0.5 MG tablet; Take 1 tablet (0.5 mg total) by mouth daily as needed for anxiety. Agitation  Dispense: 45 tablet; Refill: 0 - divalproex (DEPAKOTE) 250 MG DR tablet; TAKE 1 TABLET TWICE DAILY AND 2 TABLETS IN THE EVENING  Dispense: 270 tablet; Refill: 1  4. Anxiety  Stable  5. Mixed hyperlipidemia  Last labs reviewed, recheck next visit   6. Long-term use of high-risk medication  - COMPLETE METABOLIC PANEL WITH GFR -  CBC with Differential/Platelet  7. Hyperglycemia  - Hemoglobin A1c - Insulin, fasting  8. Agitation  - LORazepam (ATIVAN) 0.5 MG tablet; Take 1 tablet (0.5 mg total) by mouth daily as needed for anxiety. Agitation  Dispense: 45 tablet; Refill: 0  9. Urinary frequency  - Urine Culture - POCT UA - Glucose/Protein  10. Colon cancer screening  - Cologuard

## 2017-06-10 LAB — URINE CULTURE

## 2017-06-10 LAB — COMPLETE METABOLIC PANEL WITH GFR
ALK PHOS: 52 U/L (ref 40–115)
ALT: 11 U/L (ref 9–46)
AST: 16 U/L (ref 10–35)
Albumin: 4 g/dL (ref 3.6–5.1)
BUN: 9 mg/dL (ref 7–25)
CO2: 24 mmol/L (ref 20–32)
Calcium: 9.6 mg/dL (ref 8.6–10.3)
Chloride: 101 mmol/L (ref 98–110)
Creat: 0.96 mg/dL (ref 0.70–1.33)
GFR, Est African American: >89 mL/min (ref >=60)
GFR, Est Non African American: 88 mL/min (ref >=60)
GLUCOSE: 79 mg/dL (ref 65–99)
POTASSIUM: 4.4 mmol/L (ref 3.5–5.3)
SODIUM: 139 mmol/L (ref 135–146)
TOTAL PROTEIN: 7.5 g/dL (ref 6.1–8.1)
Total Bilirubin: 0.4 mg/dL (ref 0.2–1.2)

## 2017-06-10 LAB — HEMOGLOBIN A1C
HEMOGLOBIN A1C: 5.5 % (ref <5.7)
Mean Plasma Glucose: 111 mg/dL

## 2017-06-10 LAB — VALPROIC ACID LEVEL: VALPROIC ACID LVL: 57.5 mg/L (ref 50.0–100.0)

## 2017-06-12 LAB — INSULIN, FASTING: Insulin fasting, serum: 9.3 u[IU]/mL (ref 2.0–19.6)

## 2017-06-21 ENCOUNTER — Ambulatory Visit: Payer: Self-pay | Admitting: Family Medicine

## 2017-07-22 ENCOUNTER — Other Ambulatory Visit: Payer: Self-pay | Admitting: Family Medicine

## 2017-07-22 DIAGNOSIS — R569 Unspecified convulsions: Secondary | ICD-10-CM

## 2017-07-22 DIAGNOSIS — F840 Autistic disorder: Secondary | ICD-10-CM

## 2017-08-07 ENCOUNTER — Encounter: Payer: Self-pay | Admitting: Family Medicine

## 2017-08-07 ENCOUNTER — Ambulatory Visit (INDEPENDENT_AMBULATORY_CARE_PROVIDER_SITE_OTHER): Payer: Medicare Other | Admitting: Family Medicine

## 2017-08-07 VITALS — BP 100/70 | HR 80 | Temp 98.1°F | Resp 18 | Ht 69.0 in | Wt 186.0 lb

## 2017-08-07 DIAGNOSIS — R4189 Other symptoms and signs involving cognitive functions and awareness: Secondary | ICD-10-CM | POA: Diagnosis not present

## 2017-08-07 DIAGNOSIS — F840 Autistic disorder: Secondary | ICD-10-CM | POA: Diagnosis not present

## 2017-08-07 DIAGNOSIS — R451 Restlessness and agitation: Secondary | ICD-10-CM

## 2017-08-07 DIAGNOSIS — Z1211 Encounter for screening for malignant neoplasm of colon: Secondary | ICD-10-CM | POA: Diagnosis not present

## 2017-08-07 DIAGNOSIS — F419 Anxiety disorder, unspecified: Secondary | ICD-10-CM

## 2017-08-07 DIAGNOSIS — R739 Hyperglycemia, unspecified: Secondary | ICD-10-CM | POA: Diagnosis not present

## 2017-08-07 DIAGNOSIS — E782 Mixed hyperlipidemia: Secondary | ICD-10-CM | POA: Diagnosis not present

## 2017-08-07 DIAGNOSIS — Z23 Encounter for immunization: Secondary | ICD-10-CM

## 2017-08-07 DIAGNOSIS — R569 Unspecified convulsions: Secondary | ICD-10-CM | POA: Diagnosis not present

## 2017-08-07 MED ORDER — HYDROXYZINE HCL 10 MG PO TABS: 10.0000 mg | ORAL_TABLET | Freq: Two times a day (BID) | ORAL | 0 refills | Status: DC | PRN

## 2017-08-07 MED ORDER — LORAZEPAM 0.5 MG PO TABS: 0.5000 mg | ORAL_TABLET | Freq: Every day | ORAL | 0 refills | Status: DC | PRN

## 2017-08-07 NOTE — Addendum Note (Signed)
Addended by: Cynda Familia on: 08/07/2017 03:29 PM   Modules accepted: Orders

## 2017-08-07 NOTE — Progress Notes (Signed)
Name: Erik Henderson   MRN: [id number]    DOB: Mar 08, 1961   Date:08/07/2017       Progress Note  Subjective  Chief Complaint  Chief Complaint  Patient presents with  . Medication Refill    2 month F/U  . Seizures  . Congitive impairment  . Allergic Rhinitis     HPI  Agitation/Austims: he is down from Lorazepam from 41m to 0.5 mg as needed for agitation, and seems to be tolerating it well, we  decrease to 45 pills every 3 months, however they are out of medication with one month to go. .Marland KitchenHe is doing well in terms of sleep, no longer getting agitated before bedtime. Also takes 2 mg Risperdal in am. He lives with mother, sister (Quita Skye) that are his caregivers also has two other brothers with disability at home.  We will try Atarax prn, explained we will try to wean off Lorazepam to take prn only severe symptoms.   Seizure disorder: caregivers states he has been seizure free for many years, but they are afraid of stopping medication. Dose has been adjusted and last valproic acid level was at goal.  Insulin Resistance: mother has diabetes, he is on diabetic diet, his weight has been stable, no change in appetite, however over the past couple of months he has been having polydipsia and  polyuria. Last insulin level was elevated. Last urine culture negative. Caregiver thinks related to habit of drinking water.   Urinary frequency: caregiver states that he has been drinking more water and having urinary frequency over the past 3 months, he is not having fever or chills, appetite is normal, but he also been drinking more water - he goes to the bathroom and drinks when nobody is around him. No incontinence and does not seem to be in pain. Urine culture negative  Patient Active Problem List   Diagnosis Date Noted  . Anxiety 04/14/2015  . Cognitive impairment 04/14/2015  . Autism 02/23/2015  . Hay fever 02/21/2008    Past Surgical History:  Procedure Laterality Date  . NO PAST SURGERIES       Family History  Problem Relation Age of Onset  . Hypertension Mother   . Hyperlipidemia Mother   . Hypertension Father   . Coronary artery disease Father   . Hypertension Brother   . Autism Brother   . Mental retardation Brother     Social History   Social History  . Marital status: Single    Spouse name: N/A  . Number of children: N/A  . Years of education: N/A   Occupational History  . Not on file.   Social History Main Topics  . Smoking status: Never Smoker  . Smokeless tobacco: Never Used  . Alcohol use No  . Drug use: No  . Sexual activity: No   Other Topics Concern  . Not on file   Social History Narrative   Lives with mother, brothers and sisters.    On disability since birth         Current Outpatient Prescriptions:  .  divalproex (DEPAKOTE) 250 MG DR tablet, TAKE 1 TABLET TWICE DAILY AND 2 TABLETS IN THE EVENING, Disp: 120 tablet, Rfl: 5 .  LORazepam (ATIVAN) 0.5 MG tablet, Take 1 tablet (0.5 mg total) by mouth daily as needed for anxiety. Agitation, Disp: 45 tablet, Rfl: 0 .  risperiDONE (RISPERDAL) 2 MG tablet, Take 1 tablet (2 mg total) by mouth 2 (two) times daily., Disp: 180 tablet,  Rfl: 3 .  fexofenadine (ALLEGRA) 180 MG tablet, Take 1 tablet (180 mg total) by mouth daily. (Patient not taking: Reported on 06/09/2017), Disp: 30 tablet, Rfl: 0 .  hydrOXYzine (ATARAX/VISTARIL) 10 MG tablet, Take 1 tablet (10 mg total) by mouth 2 (two) times daily as needed for anxiety., Disp: 30 tablet, Rfl: 0  No Known Allergies   ROS  Ten systems reviewed and is negative except as mentioned in HPI   Objective  Vitals:   08/07/17 1449  BP: 100/70  Pulse: 80  Resp: 18  Temp: 98.1 F (36.7 C)  TempSrc: Oral  SpO2: 96%  Weight: 186 lb (84.4 kg)  Height: _0  (1.753 m)    Body mass index is 27.47 kg/m.  Physical Exam  Constitutional: Patient appears well-developed and well-nourished. Overweight.  No distress.  HEENT: head atraumatic,  normocephalic, pupils equal and reactive to light, neck supple, throat within normal limits Cardiovascular: Normal rate, regular rhythm and normal heart sounds.  No murmur heard. No BLE edema. Pulmonary/Chest: Effort normal and breath sounds normal. No respiratory distress. Abdominal: Soft.  There is no tenderness. Psychiatric: Patient has a normal mood and affect. He is humming during his visit and moving his trunk back and forth. Always seems happy   Recent Results (from the past 2160 hour(s))  COMPLETE METABOLIC PANEL WITH GFR     Status: None   Collection Time: 06/09/17 11:53 AM  Result Value Ref Range   Sodium 139 135 - 146 mmol/L   Potassium 4.4 3.5 - 5.3 mmol/L   Chloride 101 98 - 110 mmol/L   CO2 24 20 - 32 mmol/L    Comment: ** Please note change in reference range(s). **      Glucose, Bld 79 65 - 99 mg/dL   BUN 9 7 - 25 mg/dL   Creat 0.96 0.70 - 1.33 mg/dL    Comment:   For patients > or = 56 years of age: The upper reference limit for Creatinine is approximately 13% higher for people identified as African-American.      Total Bilirubin 0.4 0.2 - 1.2 mg/dL   Alkaline Phosphatase 52 40 - 115 U/L   AST 16 10 - 35 U/L   ALT 11 9 - 46 U/L   Total Protein 7.5 6.1 - 8.1 g/dL   Albumin 4.0 3.6 - 5.1 g/dL   Calcium 9.6 8.6 - 10.3 mg/dL   GFR, Est African American >89 >=60 mL/min   GFR, Est Non African American 88 >=60 mL/min  CBC with Differential/Platelet     Status: Abnormal   Collection Time: 06/09/17 11:53 AM  Result Value Ref Range   WBC 4.9 3.8 - 10.8 K/uL   RBC 4.67 4.20 - 5.80 MIL/uL   Hemoglobin 13.6 13.2 - 17.1 g/dL   HCT 41.8 38.5 - 50.0 %   MCV 89.5 80.0 - 100.0 fL   MCH 29.1 27.0 - 33.0 pg   MCHC 32.5 32.0 - 36.0 g/dL   RDW 13.4 11.0 - 15.0 %   Platelets 232 140 - 400 K/uL   MPV 9.4 7.5 - 12.5 fL   Neutro Abs 2,548 1,500 - 7,800 cells/uL   Lymphs Abs 1,764 850 - 3,900 cells/uL   Monocytes Absolute 588 200 - 950 cells/uL   Eosinophils Absolute 0 (L) 15  - 500 cells/uL   Basophils Absolute 0 0 - 200 cells/uL   Neutrophils Relative % 52 %   Lymphocytes Relative 36 %   Monocytes Relative 12 %  Eosinophils Relative 0 %   Basophils Relative 0 %   Smear Review Criteria for review not met   Hemoglobin A1c     Status: None   Collection Time: 06/09/17 11:53 AM  Result Value Ref Range   Hgb A1c MFr Bld 5.5 <5.7 %    Comment:   For the purpose of screening for the presence of diabetes:   <5.7%       Consistent with the absence of diabetes 5.7-6.4 %   Consistent with increased risk for diabetes (prediabetes) >=6.5 %     Consistent with diabetes   This assay result is consistent with a decreased risk of diabetes.   Currently, no consensus exists regarding use of hemoglobin A1c for diagnosis of diabetes in children.   According to American Diabetes Association (ADA) guidelines, hemoglobin A1c <7.0% represents optimal control in non-pregnant diabetic patients. Different metrics may apply to specific patient populations. Standards of Medical Care in Diabetes (ADA).      Mean Plasma Glucose 111 mg/dL  Insulin, fasting     Status: None   Collection Time: 06/09/17 11:53 AM  Result Value Ref Range   Insulin fasting, serum 9.3 2.0 - 19.6 uIU/mL    Comment:   This insulin assay shows strong cross-reactivity for some insulin analogs (lispro, aspart, and glargine) and much lower cross-reactivity with others (detemir, glulisine).   Stimulated Insulin reference intervals were established using the Siemens Immulite assay. These values are provided for general guidance only.   Valproic Acid level     Status: None   Collection Time: 06/09/17 11:53 AM  Result Value Ref Range   Valproic Acid Lvl 57.5 50.0 - 100.0 mg/L  POCT UA - Glucose/Protein     Status: Normal   Collection Time: 06/09/17 12:21 PM  Result Value Ref Range   Glucose, UA negative    Protein, UA negative   Urine Culture     Status: None   Collection Time: 06/09/17 12:23 PM   Result Value Ref Range   Organism ID, Bacteria      Three or more organisms present,each greater than 10,000 CFU/mL.These organisms,commonly found on external and internal genitalia,are considered to be colonizers.No further testing performed.      PHQ2/9: Depression screen [facility] 2/9 06/09/2017 06/06/2016 08/17/2015 04/14/2015  Decreased Interest 0 0 0 0  Down, Depressed, Hopeless 0 0 0 0  PHQ - 2 Score 0 0 0 0     Fall Risk: Fall Risk  06/09/2017 11/15/2016 06/06/2016 08/17/2015 04/14/2015  Falls in the past year? _0       Assessment & Plan  1. Seizure ([place])  Stable, no recent episodes  2. Needs flu shot  - Flu Vaccine QUAD 6+ mos PF IM (Fluarix Quad PF)  3. Cognitive impairment   4. Mixed hyperlipidemia   5. Active autistic disorder   6. Hyperglycemia  Discussed life style modification   7. Agitation  - hydrOXYzine (ATARAX/VISTARIL) 10 MG tablet; Take 1 tablet (10 mg total) by mouth 2 (two) times daily as needed for anxiety.  Dispense: 30 tablet; Refill: 0  9. Anxiety  - hydrOXYzine (ATARAX/VISTARIL) 10 MG tablet; Take 1 tablet (10 mg total) by mouth 2 (two) times daily as needed for anxiety.  Dispense: 30 tablet; Refill: 0  10. Colon cancer screening  They need to do cologuard, ordered last visit

## 2017-09-07 ENCOUNTER — Other Ambulatory Visit: Payer: Self-pay | Admitting: Family Medicine

## 2017-09-07 DIAGNOSIS — R451 Restlessness and agitation: Secondary | ICD-10-CM

## 2017-09-07 DIAGNOSIS — F419 Anxiety disorder, unspecified: Secondary | ICD-10-CM

## 2017-09-08 ENCOUNTER — Encounter: Payer: Self-pay | Admitting: Family Medicine

## 2017-09-08 ENCOUNTER — Ambulatory Visit (INDEPENDENT_AMBULATORY_CARE_PROVIDER_SITE_OTHER): Payer: Medicare Other | Admitting: Family Medicine

## 2017-09-08 VITALS — BP 116/74 | HR 62 | Temp 98.3°F | Resp 16 | Ht 69.0 in | Wt 186.7 lb

## 2017-09-08 DIAGNOSIS — R451 Restlessness and agitation: Secondary | ICD-10-CM | POA: Diagnosis not present

## 2017-09-08 DIAGNOSIS — R569 Unspecified convulsions: Secondary | ICD-10-CM | POA: Diagnosis not present

## 2017-09-08 DIAGNOSIS — E782 Mixed hyperlipidemia: Secondary | ICD-10-CM | POA: Diagnosis not present

## 2017-09-08 DIAGNOSIS — R739 Hyperglycemia, unspecified: Secondary | ICD-10-CM | POA: Diagnosis not present

## 2017-09-08 DIAGNOSIS — R4189 Other symptoms and signs involving cognitive functions and awareness: Secondary | ICD-10-CM

## 2017-09-08 DIAGNOSIS — F840 Autistic disorder: Secondary | ICD-10-CM

## 2017-09-08 MED ORDER — ALPRAZOLAM ER 0.5 MG PO TB24: 0.5000 mg | ORAL_TABLET | Freq: Every day | ORAL | 2 refills | Status: DC

## 2017-09-08 NOTE — Progress Notes (Signed)
Name: Erik Henderson   MRN: [id number]    DOB: 1961-01-11   Date:09/08/2017       Progress Note  Subjective  Chief Complaint  Chief Complaint  Patient presents with  . Follow-up    1 month F/U  . Agitation    Hydroxyzine is not working, very restless-going from room to room. Very anxious and can't sit still with this medication-not sleep well.     HPI  Agitation/Austims: he is down from Lorazepam from 1mg  to 0.5 mg as needed for agitation, and was tolerating it well, we  decreased to 45 pills every 3 months, and added Hydroxyzine, however it made him more agitated and restltess at home. He also takes 2 mg Risperdal in am. He lives with mother, sister [name]( [place] ) that are his caregivers also has two other brothers with disability at home. We will switch from Klonopin prn, since they are pretty much giving it to him around the clock to alprazolam XR, otherwise if not tolerated needs to follow up with psychiatrist.   Seizure disorder: caregivers states he has been seizure free for many years, but they are afraid of stopping medication. Dose has been adjusted and last valproic acid level was at goal. No side effects of medication   Insulin Resistance: mother has diabetes, he is on diabetic diet, his weight has been stable, no change in appetite, however over the past couple of months he has been having polydipsia and polyuria. Last insulin level was elevated. Last urine culture negative. Caregiver thinks related to habit of drinking water. last hgbA1C 5.5%  Dyslipidemia: discussed again life style modification     Patient Active Problem List   Diagnosis Date Noted  . Anxiety 04/14/2015  . Cognitive impairment 04/14/2015  . Autism 02/23/2015  . Hay fever 02/21/2008    Past Surgical History:  Procedure Laterality Date  . NO PAST SURGERIES      Family History  Problem Relation Age of Onset  . Hypertension Mother   . Hyperlipidemia Mother   . Hypertension Father   . Coronary  artery disease Father   . Hypertension Brother   . Autism Brother   . Mental retardation Brother     Social History   Socioeconomic History  . Marital status: Single    Spouse name: Not on file  . Number of children: Not on file  . Years of education: Not on file  . Highest education level: Not on file  Social Needs  . Financial resource strain: Not on file  . Food insecurity - worry: Not on file  . Food insecurity - inability: Not on file  . Transportation needs - medical: Not on file  . Transportation needs - non-medical: Not on file  Occupational History  . Not on file  Tobacco Use  . Smoking status: Never Smoker  . Smokeless tobacco: Never Used  Substance and Sexual Activity  . Alcohol use: No    Alcohol/week: 0.0 oz  . Drug use: No  . Sexual activity: No  Other Topics Concern  . Not on file  Social History Narrative   Lives with mother, brothers and sisters.    On disability since birth         Current Outpatient Medications:  .  divalproex (DEPAKOTE) 250 MG DR tablet, TAKE 1 TABLET TWICE DAILY AND 2 TABLETS IN THE EVENING, Disp: 120 tablet, Rfl: 5 .  risperiDONE (RISPERDAL) 2 MG tablet, Take 1 tablet (2 mg total) by mouth 2 (two)  times daily., Disp: 180 tablet, Rfl: 3 .  fexofenadine (ALLEGRA) 180 MG tablet, Take 1 tablet (180 mg total) by mouth daily. (Patient not taking: Reported on 09/08/2017), Disp: 30 tablet, Rfl: 0  No Known Allergies   ROS  Constitutional: Negative for fever or weight change.  Respiratory: Negative for cough and shortness of breath.   Cardiovascular: Negative for chest pain or palpitations.  Gastrointestinal: Negative for abdominal pain, no bowel changes.  Musculoskeletal: Negative for gait problem or joint swelling.  Skin: Negative for rash.  Neurological: Negative for dizziness or headache.  No other specific complaints in a complete review of systems (except as listed in HPI above).  Objective  Vitals:   09/08/17 1401  BP:  116/74  Pulse: 62  Resp: 16  Temp: 98.3 F (36.8 C)  TempSrc: Oral  SpO2: 98%  Weight: 186 lb 11.2 oz (84.7 kg)  Height: 5\' 9"  (1.753 m)    Body mass index is 27.57 kg/m.  Physical Exam  Constitutional: Patient appears well-developed and well-nourished. Overweight. No distress.  HEENT: head atraumatic, normocephalic, pupils equal and reactive to light, neck supple, throat within normal limits Cardiovascular: Normal rate, regular rhythm and normal heart sounds. No murmur heard. No BLE edema. Pulmonary/Chest: Effort normal and breath sounds normal. No respiratory distress. Abdominal: Soft. There is no tenderness. Psychiatric: Patient has a normal mood and affect. He is humming during his visit and moving his trunk back and forth.    PHQ2/9: [facility] 06/09/2017 06/06/2016 08/17/2015 04/14/2015  Decreased Interest 0 0 0 0  Down, Depressed, Hopeless 0 0 0 0  PHQ - 2 Score 0 0 0 0    Fall Risk: Fall Risk  09/08/2017 06/09/2017 11/15/2016 06/06/2016 08/17/2015  Falls in the past year? No No No No No     Functional Status Survey: Is the patient deaf or have difficulty hearing?: No Does the patient have difficulty seeing, even when wearing glasses/contacts?: No Does the patient have difficulty concentrating, remembering, or making decisions?: Yes Does the patient have difficulty walking or climbing stairs?: No Does the patient have difficulty dressing or bathing?: No Does the patient have difficulty doing errands alone such as visiting a doctor's office or shopping?: Yes    Assessment & Plan   1. Seizure (HCC)  Continue Depakote  2. Agitation  Stop Klonopin and Atarax we will try Xanax XR - ALPRAZolam (XANAX XR) 0.5 MG 24 hr tablet; Take 1 tablet (0.5 mg total) daily by mouth.  Dispense: 30 tablet; Refill: 2  3. Active autistic disorder  - ALPRAZolam (XANAX XR) 0.5 MG 24 hr tablet; Take 1 tablet (0.5 mg total) daily by mouth.  Dispense: 30 tablet;  Refill: 2  4. Cognitive impairment  stable  5. Mixed hyperlipidemia  On life style modification   6. Hyperglycemia  Continue life style modification

## 2017-09-26 ENCOUNTER — Other Ambulatory Visit: Payer: Self-pay | Admitting: Family Medicine

## 2017-09-26 DIAGNOSIS — F419 Anxiety disorder, unspecified: Secondary | ICD-10-CM

## 2017-09-26 DIAGNOSIS — R451 Restlessness and agitation: Secondary | ICD-10-CM

## 2017-10-06 ENCOUNTER — Other Ambulatory Visit: Payer: Self-pay | Admitting: Family Medicine

## 2017-10-06 DIAGNOSIS — F419 Anxiety disorder, unspecified: Secondary | ICD-10-CM

## 2017-10-06 DIAGNOSIS — R451 Restlessness and agitation: Secondary | ICD-10-CM

## 2017-11-16 ENCOUNTER — Telehealth: Payer: Self-pay | Admitting: Family Medicine

## 2017-11-16 NOTE — Telephone Encounter (Signed)
Copied from CRM [phone]. Topic: General - Other >> Nov 16, 2017  3:48 PM Erik Henderson, Caira Poche L, RMA wrote: Reason for CRM: patient is requesting a callback concerning medication increase for Alprazolam pt states medication is not working

## 2017-11-17 NOTE — Telephone Encounter (Signed)
We will discuss during his visit

## 2017-11-20 ENCOUNTER — Ambulatory Visit (INDEPENDENT_AMBULATORY_CARE_PROVIDER_SITE_OTHER): Payer: Medicare Other | Admitting: Family Medicine

## 2017-11-20 ENCOUNTER — Encounter: Payer: Self-pay | Admitting: Family Medicine

## 2017-11-20 VITALS — BP 130/82 | HR 81 | Temp 98.5°F | Resp 18 | Ht 69.0 in | Wt 185.6 lb

## 2017-11-20 DIAGNOSIS — R4189 Other symptoms and signs involving cognitive functions and awareness: Secondary | ICD-10-CM

## 2017-11-20 DIAGNOSIS — F840 Autistic disorder: Secondary | ICD-10-CM | POA: Diagnosis not present

## 2017-11-20 DIAGNOSIS — F419 Anxiety disorder, unspecified: Secondary | ICD-10-CM | POA: Diagnosis not present

## 2017-11-20 NOTE — Progress Notes (Signed)
Name: Erik Henderson   MRN: [id number]    DOB: 30-Sep-1961   Date:11/20/2017       Progress Note  Subjective  Chief Complaint  Chief Complaint  Patient presents with  . Medication Problem    alprazolam was changed, patient not acting the same    HPI  Pt presents Erik Henderson, Older Sister and Caregiver presents with patient and provides the history.  Pt has autism with agitation.  He was taking Lorazepam, and PCP Dr. Carlynn Henderson weaned him down to a lower dose over several months, tried this with hydroxyzine, but this made him more agitated and restless.  Pt was then switched to Xanax XR At last visit 09/08/17.  Sister notes that he has been taking Xanax each morning for 2 months, and she has noticed increased agitation with this medication change.  She would like to increase dosing or change back to Lorazepam.  Sister notes that she has been giving 2mg  Risperdal ONCE daily instead of twice daily as it is written.  He is taking depakote 2 tabs in the evenings.  Patient Active Problem List   Diagnosis Date Noted  . Anxiety 04/14/2015  . Cognitive impairment 04/14/2015  . Autism 02/23/2015  . Hay fever 02/21/2008    Social History   Tobacco Use  . Smoking status: Never Smoker  . Smokeless tobacco: Never Used  Substance Use Topics  . Alcohol use: No    Alcohol/week: 0.0 oz     Current Outpatient Medications:  .  ALPRAZolam (XANAX XR) 0.5 MG 24 hr tablet, Take 1 tablet (0.5 mg total) daily by mouth., Disp: 30 tablet, Rfl: 2 .  divalproex (DEPAKOTE) 250 MG DR tablet, TAKE 1 TABLET TWICE DAILY AND 2 TABLETS IN THE EVENING, Disp: 120 tablet, Rfl: 5 .  fexofenadine (ALLEGRA) 180 MG tablet, Take 1 tablet (180 mg total) by mouth daily., Disp: 30 tablet, Rfl: 0 .  risperiDONE (RISPERDAL) 2 MG tablet, Take 1 tablet (2 mg total) by mouth 2 (two) times daily., Disp: 180 tablet, Rfl: 3  No Known Allergies  ROS  Constitutional: Negative for fever or weight change.  Respiratory: Negative for cough and  shortness of breath.   Cardiovascular: Negative for chest pain or palpitations.  Gastrointestinal: Negative for abdominal pain, no bowel changes.  No increased urinary frequency or complaints of pain with urination. Musculoskeletal: Negative for gait problem or joint swelling.  Skin: Negative for rash.  Neurological: Negative for dizziness or headache.  No other specific complaints in a complete review of systems (except as listed in HPI above).  Objective  Vitals:   11/20/17 1042  BP: 130/82  Pulse: 81  Resp: 18  Temp: 98.5 F (36.9 C)  TempSrc: Oral  SpO2: 97%  Weight: 185 lb 9.6 oz (84.2 kg)  Height: 5\' 9"  (1.753 m)   Body mass index is 27.41 kg/m.  Nursing Note and Vital Signs reviewed.  Physical Exam Constitutional: Patient appears well-developed and well-nourished. Overweight. No distress.  HEENT: head atraumatic Cardiovascular: Normal rate, regular rhythm, S1/S2 present.  No murmur or rub heard. No BLE edema. Pulmonary/Chest: Effort normal and breath sounds clear. No respiratory distress or retractions. Psychiatric: Patient has a baseline mood and affect. behavior is baseline - rocking back and forth and humming during examination. Judgment and thought content baseline.  No results found for this or any previous visit (from the past 72 hour(s)).  Assessment & Plan  1. Autism 2. Cognitive impairment 3. Anxiety - Advised to take Risperdal BID instead  of once daily.  Follow up on 12/08/17 with Erik Henderson as planned. Advised that we will not be able to increase Xanax dosing, and that the next step if agitation persists will likely be referral to Psychiatry.  Discussed this plan of care with PCP Erik Henderson who is in agreement.

## 2017-11-20 NOTE — Patient Instructions (Signed)
Please give Risperdal TWICE DAILY.

## 2017-11-20 NOTE — Telephone Encounter (Signed)
Patient is coming in today to see Maurice Smallmily Boyce for medication changes.

## 2017-12-07 ENCOUNTER — Encounter: Payer: Self-pay | Admitting: Family Medicine

## 2017-12-07 ENCOUNTER — Ambulatory Visit (INDEPENDENT_AMBULATORY_CARE_PROVIDER_SITE_OTHER): Payer: Medicare Other | Admitting: Family Medicine

## 2017-12-07 VITALS — BP 118/78 | HR 84 | Temp 98.8°F | Resp 18 | Ht 69.0 in | Wt 185.0 lb

## 2017-12-07 DIAGNOSIS — E782 Mixed hyperlipidemia: Secondary | ICD-10-CM | POA: Diagnosis not present

## 2017-12-07 DIAGNOSIS — Z79899 Other long term (current) drug therapy: Secondary | ICD-10-CM

## 2017-12-07 DIAGNOSIS — R451 Restlessness and agitation: Secondary | ICD-10-CM

## 2017-12-07 DIAGNOSIS — R569 Unspecified convulsions: Secondary | ICD-10-CM

## 2017-12-07 DIAGNOSIS — F840 Autistic disorder: Secondary | ICD-10-CM

## 2017-12-07 DIAGNOSIS — E663 Overweight: Secondary | ICD-10-CM | POA: Diagnosis not present

## 2017-12-07 DIAGNOSIS — R739 Hyperglycemia, unspecified: Secondary | ICD-10-CM | POA: Diagnosis not present

## 2017-12-07 MED ORDER — RISPERIDONE 2 MG PO TABS: 2.0000 mg | ORAL_TABLET | Freq: Two times a day (BID) | ORAL | 0 refills | Status: DC

## 2017-12-07 MED ORDER — ALPRAZOLAM ER 0.5 MG PO TB24: 0.5000 mg | ORAL_TABLET | Freq: Every day | ORAL | 2 refills | Status: DC

## 2017-12-07 MED ORDER — DIVALPROEX SODIUM 250 MG PO DR TAB: DELAYED_RELEASE_TABLET | ORAL | 5 refills | Status: DC

## 2017-12-07 NOTE — Progress Notes (Signed)
Name: Erik Henderson   MRN: [id number]    DOB: 02-20-1961   Date:12/07/2017       Progress Note  Subjective  Chief Complaint  Chief Complaint  Patient presents with  . Medication Refill    3 month F/U  . Seizures  . Agitation    Patient mother states the new medication is not working as well as when on higher dose of Depakote and Risperdal. Will not sit still, back and forward all over their house, constantly going to the bathroom. He is very restless and not sleeping well. He is not listening to his family or making eye contact with them since changing medication    HPI  Pt presents Erik JupiterDale, older Sister.   She brought him a few weeks ago with the same complaints, and at the time they wanted to increase dose of alprazolam XR or go back on Klonopin. She states that episodes of agitation - needs to get up and move and can't sit still - happens about three times a week, no urine odor, urinary frequency, fever, chills, nausea or vomiting, no change in bowel movements. Appetite is normal, and still hums at home.    Seizure disorder: caregivers states he has been seizure free for many years, but they are afraid of stopping medication. Dose has been adjusted and last valproic acid level wasat goal. No side effects of medication. We will recheck labs since he is having episodes of agitation   Insulin Resistance: mother has diabetes, he is on diabetic diet, his weight has been stable, no change in appetite, no polyuria, but likes to drink a lot of water. Last insulin level was elevated. Last urine culture negative. .last hgbA1C 5.5%  Dyslipidemia: discussed again life style modification     Patient Active Problem List   Diagnosis Date Noted  . Anxiety 04/14/2015  . Cognitive impairment 04/14/2015  . Autism 02/23/2015  . Hay fever 02/21/2008    Past Surgical History:  Procedure Laterality Date  . NO PAST SURGERIES      Family History  Problem Relation Age of Onset  . Hypertension  Mother   . Hyperlipidemia Mother   . Hypertension Father   . Coronary artery disease Father   . Hypertension Brother   . Autism Brother   . Mental retardation Brother     Social History   Socioeconomic History  . Marital status: Single    Spouse name: Not on file  . Number of children: 0  . Years of education: Not on file  . Highest education level: Never attended school  Social Needs  . Financial resource strain: Somewhat hard  . Food insecurity - worry: Never true  . Food insecurity - inability: Never true  . Transportation needs - medical: No  . Transportation needs - non-medical: No  Occupational History  . Occupation: disabled  Tobacco Use  . Smoking status: Never Smoker  . Smokeless tobacco: Never Used  Substance and Sexual Activity  . Alcohol use: No    Alcohol/week: 0.0 oz  . Drug use: No  . Sexual activity: No  Other Topics Concern  . Not on file  Social History Narrative   Lives with mother, brothers and sisters.    On disability since birth            Current Outpatient Medications:  .  ALPRAZolam (XANAX XR) 0.5 MG 24 hr tablet, Take 1 tablet (0.5 mg total) by mouth daily., Disp: 30 tablet, Rfl: 2 .  divalproex (  DEPAKOTE) 250 MG DR tablet, TAKE 1 TABLET TWICE DAILY AND 2 TABLETS IN THE EVENING, Disp: 120 tablet, Rfl: 5 .  fexofenadine (ALLEGRA) 180 MG tablet, Take 1 tablet (180 mg total) by mouth daily., Disp: 30 tablet, Rfl: 0 .  risperiDONE (RISPERDAL) 2 MG tablet, Take 1 tablet (2 mg total) by mouth 2 (two) times daily., Disp: 180 tablet, Rfl: 0  No Known Allergies   ROS  Constitutional: Negative for fever or weight change.  Respiratory: Negative for cough and shortness of breath.   Cardiovascular: Negative for chest pain or palpitations.  Gastrointestinal: Negative for abdominal pain, no bowel changes.  Musculoskeletal: Negative for gait problem or joint swelling.  Skin: Negative for rash.  Neurological: Negative for dizziness or headache.   No other specific complaints in a complete review of systems (except as listed in HPI above).  Objective  Vitals:   12/07/17 0933  BP: 118/78  Pulse: 84  Resp: 18  Temp: 98.8 F (37.1 C)  TempSrc: Oral  SpO2: 98%  Weight: 185 lb (83.9 kg)  Height: 5\' 9"  (1.753 m)    Body mass index is 27.32 kg/m.  Physical Exam   Constitutional: Patient appears well-developed and well-nourished. Overweight. No distress.  HEENT: head atraumatic, normocephalic, pupils equal and reactive to light, neck supple, throat within normal limits Cardiovascular: Normal rate, regular rhythm and normal heart sounds. No murmur heard. No BLE edema. Pulmonary/Chest: Effort normal and breath sounds normal. No respiratory distress. Abdominal: Soft. There is no tenderness. Psychiatric: Patient has a normal mood and affect. He is not humming, but seems content, sitting and rocking back and forth on the exam table.     PHQ2/9: Depression screen [facility] 2/9 06/09/2017 06/06/2016 08/17/2015 04/14/2015  Decreased Interest 0 0 0 0  Down, Depressed, Hopeless 0 0 0 0  PHQ - 2 Score 0 0 0 0     Fall Risk: Fall Risk  12/07/2017 09/08/2017 06/09/2017 11/15/2016 06/06/2016  Falls in the past year? No No No No No      Functional Status Survey: Is the patient deaf or have difficulty hearing?: Yes Does the patient have difficulty seeing, even when wearing glasses/contacts?: No Does the patient have difficulty concentrating, remembering, or making decisions?: Yes Does the patient have difficulty walking or climbing stairs?: No Does the patient have difficulty dressing or bathing?: Yes Does the patient have difficulty doing errands alone such as visiting a doctor's office or shopping?: Yes    Assessment & Plan  1. Autism  Seems to be more agitated than usual over the past couple of weeks, we will check labs, no change in medication at this time. If labs negative we may increase dose of Risperdal , but recommended  referral to psychiatrist also   - ALPRAZolam (XANAX XR) 0.5 MG 24 hr tablet; Take 1 tablet (0.5 mg total) by mouth daily.  Dispense: 30 tablet; Refill: 2 - divalproex (DEPAKOTE) 250 MG DR tablet; TAKE 1 TABLET TWICE DAILY AND 2 TABLETS IN THE EVENING  Dispense: 120 tablet; Refill: 5 - risperiDONE (RISPERDAL) 2 MG tablet; Take 1 tablet (2 mg total) by mouth 2 (two) times daily.  Dispense: 180 tablet; Refill: 0 - referral psychiatrist -   2. Seizure (HCC)  - divalproex (DEPAKOTE) 250 MG DR tablet; TAKE 1 TABLET TWICE DAILY AND 2 TABLETS IN THE EVENING  Dispense: 120 tablet; Refill: 5  3. Agitation  - ALPRAZolam (XANAX XR) 0.5 MG 24 hr tablet; Take 1 tablet (0.5 mg total) by  mouth daily.  Dispense: 30 tablet; Refill: 2 - TSH  4. Mixed hyperlipidemia  - Lipid panel  5. Hyperglycemia  - Hemoglobin A1c  6. Overweight (BMI 25.0-29.9)  Stable   7. Long-term use of high-risk medication  - Comprehensive metabolic panel - CBC with Differential/Platele - depakote level

## 2017-12-08 ENCOUNTER — Ambulatory Visit: Payer: Self-pay | Admitting: Family Medicine

## 2017-12-08 LAB — COMPREHENSIVE METABOLIC PANEL
AG Ratio: 1.1 (calc) (ref 1.0–2.5)
ALBUMIN MSPROF: 4.1 g/dL (ref 3.6–5.1)
ALT: 9 U/L (ref 9–46)
AST: 17 U/L (ref 10–35)
Alkaline phosphatase (APISO): 58 U/L (ref 40–115)
BILIRUBIN TOTAL: 0.4 mg/dL (ref 0.2–1.2)
BUN: 7 mg/dL (ref 7–25)
CALCIUM: 9.5 mg/dL (ref 8.6–10.3)
CHLORIDE: 101 mmol/L (ref 98–110)
CO2: 27 mmol/L (ref 20–32)
Creat: 1.03 mg/dL (ref 0.70–1.33)
GLOBULIN: 3.9 g/dL — AB (ref 1.9–3.7)
Glucose, Bld: 78 mg/dL (ref 65–99)
POTASSIUM: 4.1 mmol/L (ref 3.5–5.3)
SODIUM: 138 mmol/L (ref 135–146)
TOTAL PROTEIN: 8 g/dL (ref 6.1–8.1)

## 2017-12-08 LAB — LIPID PANEL
Cholesterol: 156 mg/dL (ref <200)
HDL: 58 mg/dL (ref >40)
LDL Cholesterol (Calc): 82 mg/dL (calc)
NON-HDL CHOLESTEROL (CALC): 98 mg/dL (ref <130)
Total CHOL/HDL Ratio: 2.7 (calc) (ref <5.0)
Triglycerides: 76 mg/dL (ref <150)

## 2017-12-08 LAB — CBC WITH DIFFERENTIAL/PLATELET
BASOS ABS: 48 {cells}/uL (ref 0–200)
Basophils Relative: 1 %
EOS PCT: 1.7 %
Eosinophils Absolute: 82 cells/uL (ref 15–500)
HEMATOCRIT: 39.2 % (ref 38.5–50.0)
Hemoglobin: 13.4 g/dL (ref 13.2–17.1)
LYMPHS ABS: 1488 {cells}/uL (ref 850–3900)
MCH: 28.9 pg (ref 27.0–33.0)
MCHC: 34.2 g/dL (ref 32.0–36.0)
MCV: 84.7 fL (ref 80.0–100.0)
MPV: 10.1 fL (ref 7.5–12.5)
Monocytes Relative: 12.1 %
NEUTROS PCT: 54.2 %
Neutro Abs: 2602 cells/uL (ref 1500–7800)
Platelets: 207 10*3/uL (ref 140–400)
RBC: 4.63 10*6/uL (ref 4.20–5.80)
RDW: 12.4 % (ref 11.0–15.0)
Total Lymphocyte: 31 %
WBC: 4.8 10*3/uL (ref 3.8–10.8)
WBCMIX: 581 {cells}/uL (ref 200–950)

## 2017-12-08 LAB — HEMOGLOBIN A1C
Hgb A1c MFr Bld: 5.5 % of total Hgb (ref <5.7)
MEAN PLASMA GLUCOSE: 111 (calc)
eAG (mmol/L): 6.2 (calc)

## 2017-12-08 LAB — TSH: TSH: 2.49 mIU/L (ref 0.40–4.50)

## 2017-12-11 ENCOUNTER — Telehealth: Payer: Self-pay | Admitting: Family Medicine

## 2017-12-11 NOTE — Telephone Encounter (Signed)
This encounter was created in error - please disregard.

## 2017-12-11 NOTE — Telephone Encounter (Signed)
They need to stretch the dose, spreading the dose by 3-4 hours over the next few days and stop when last dose is at night, he may get agitated for the first few days

## 2017-12-11 NOTE — Telephone Encounter (Signed)
Patient's sister called in for the mother to ask if the patient can stop taking the Xanax XR 0.5 mg daily. She says "he acts like a zombie since he's been taking the Xanax. I told Dr. Carlynn PurlSowles about it at the last visit and she said there would be no changes until we see a psychiatrist. But, my mama asked me to call to see if we can just stop giving it to him." I asked what time of the day is he taking the medication, she said "morning."  I advised that I would send this over to Dr. Carlynn PurlSowles and someone would be in touch with her.

## 2017-12-12 ENCOUNTER — Ambulatory Visit: Payer: Self-pay | Admitting: Family Medicine

## 2017-12-12 NOTE — Telephone Encounter (Signed)
Routed message to PCP

## 2017-12-15 DIAGNOSIS — Z79899 Other long term (current) drug therapy: Secondary | ICD-10-CM | POA: Diagnosis not present

## 2017-12-16 LAB — VALPROIC ACID LEVEL: VALPROIC ACID LVL: 69.3 mg/L (ref 50.0–100.0)

## 2018-01-03 ENCOUNTER — Ambulatory Visit: Payer: Medicare Other | Admitting: Psychiatry

## 2018-01-10 ENCOUNTER — Telehealth: Payer: Self-pay

## 2018-01-10 NOTE — Telephone Encounter (Signed)
Copied from CRM [id number]. Topic: Appointment Scheduling - Scheduling Inquiry for Clinic >> Jan 10, 2018  9:05 AM Arlyss Gandyichardson, Taren N, NT wrote: Reason for CRM: Amada Jupiterale, caretaker, is calling to see if the office can help get this pt r/s for his psychiatrist appt. She does not remember where he was originally scheduled. CB#: [phone]  Notes: Specialty Services Required New pt referred by Alba Corykrichna Sowles at [facility] DX: f84.0- autism and R45.1 - agitation  Made On: Change Notes: Canceled: 12/13/2017 11:03 AM 12/14/2017 11:58 AM 01/03/2018 8:16 AM By: By: By: Aviva SignsFOGLEMAN, BELINDA L NORTON, JESSICA L NORTON, JESSICA L  Cancel Rsn: Patient (pt is sick )    Patient can call their office at [phone] to reschedule at their convenience.

## 2018-01-11 NOTE — Telephone Encounter (Signed)
Erik Henderson caregiver the phone number for [facility]. He will call and reschedule appointment.

## 2018-01-25 ENCOUNTER — Ambulatory Visit: Payer: Self-pay | Admitting: *Deleted

## 2018-01-25 ENCOUNTER — Telehealth: Payer: Self-pay | Admitting: Family Medicine

## 2018-01-25 NOTE — Telephone Encounter (Signed)
Caregiver called with concerned that the patient is acting out more because he was put on a generic med for depakote. She states the patient is not sleeping and keeping everyone up. He is constantly drinking water and doing repetitive things. She feels like his medications need to be readjusted. Not appropriate protocol found for this assessment. Appointment made for Monday (first availability) to be seen by a provider in the office. Caregiver advised against giving more med than prescribed and to call back, or the EMS if he becomes worst. She voiced understanding.  Answer Assessment - Initial Assessment Questions 1. LEVEL OF CONSCIOUSNESS: "How is he (she, the patient) acting right now?" (e.g., alert-oriented, confused, lethargic, stuporous, comatose)     Sitting quietly right now 2. ONSET: "When did the confusion start?"  (minutes, hours, days)     yesterday 3. PATTERN "Does this come and go, or has it been constant since it started?"  "Is it present now?"     Comes and goes 4. ALCOHOL or DRUGS: "Has he been drinking alcohol or taking any drugs?"      no 5. NARCOTIC MEDICATIONS: "Has he been receiving any narcotic medications?" (e.g., morphine, Vicodin)     no 6. CAUSE: "What do you think is causing the confusion?"      Because he is now on the generic depakote 7. OTHER SYMPTOMS: "Are there any other symptoms?" (e.g., difficulty breathing, headache, fever, weakness)     He is autistic, caregiver can not tell  Protocols used: CONFUSION - [facility]

## 2018-01-25 NOTE — Telephone Encounter (Signed)
Copied from CRM [phone]. Topic: Quick Communication - See Telephone Encounter >> Jan 25, 2018 11:09 AM Cipriano BunkerLambe, Annette S wrote: CRM for notification.   Dell, care taker, divalproex (DEPAKOTE) 250 MG DR tablet  this is not working and wants to be put on brand name.   Having a hard time with him and needs something asap. He is like a zombie.     See Telephone encounter for: 01/25/18.

## 2018-01-25 NOTE — Telephone Encounter (Signed)
Per Dr Carlynn PurlSowles sending to her for review.

## 2018-01-25 NOTE — Telephone Encounter (Signed)
Dell , care taker has called back and said that she needs something ASAP for he is sweating, breathing hard and has not slept any and will not set down. Says that he will not listen to any commands that she gives and just walks around like a zombie.  Kept saying needs something ASAP . Please advise.

## 2018-01-25 NOTE — Telephone Encounter (Signed)
[facility]

## 2018-01-26 NOTE — Telephone Encounter (Signed)
He has made appt for Mon 12-29-17 with elizabeth.

## 2018-01-26 NOTE — Telephone Encounter (Signed)
Also called and spoke with Amada Jupiterale and gave her the message to go to ER per Dr Carlynn PurlSowles if need to and told her to keep the appt for Monday.

## 2018-01-29 ENCOUNTER — Telehealth: Payer: Self-pay | Admitting: Family Medicine

## 2018-01-29 ENCOUNTER — Ambulatory Visit (INDEPENDENT_AMBULATORY_CARE_PROVIDER_SITE_OTHER): Payer: Medicare Other | Admitting: Nurse Practitioner

## 2018-01-29 ENCOUNTER — Encounter: Payer: Self-pay | Admitting: Nurse Practitioner

## 2018-01-29 ENCOUNTER — Other Ambulatory Visit: Payer: Self-pay | Admitting: Nurse Practitioner

## 2018-01-29 VITALS — BP 120/80 | HR 84 | Temp 98.8°F | Resp 16 | Ht 69.0 in | Wt 182.8 lb

## 2018-01-29 DIAGNOSIS — F840 Autistic disorder: Secondary | ICD-10-CM

## 2018-01-29 DIAGNOSIS — R4189 Other symptoms and signs involving cognitive functions and awareness: Secondary | ICD-10-CM | POA: Diagnosis not present

## 2018-01-29 DIAGNOSIS — R4689 Other symptoms and signs involving appearance and behavior: Secondary | ICD-10-CM

## 2018-01-29 DIAGNOSIS — F419 Anxiety disorder, unspecified: Secondary | ICD-10-CM

## 2018-01-29 MED ORDER — DIVALPROEX SODIUM ER 250 MG PO TB24: 250.0000 mg | ORAL_TABLET | Freq: Every day | ORAL | 0 refills | Status: DC

## 2018-01-29 MED ORDER — DIVALPROEX SODIUM 250 MG PO DR TAB: 250.0000 mg | DELAYED_RELEASE_TABLET | ORAL | 1 refills | Status: DC

## 2018-01-29 NOTE — Patient Instructions (Addendum)
[facility] [address], [place], [place] [address] [phone]  I have put in a referral to Dr. Hilma FavorsHorrigan if possible- he is not a preferred for your insurance but we will try to get him in. We also prescribed then brand name depakote to see if that will help control his symptoms since the issue started with the change to generic.  If he has any signs of infection- cough that does not go away, holding is belly, fever, sweating- (not after vigorous exercise or running around the house), diarrhea or vomiting please let us know or go to the ER. If you have any concerns about his or your safety or he becomes violent please call 911.     How can I support my loved one? Talk about the condition Good communication can be helpful when supporting your friend or family member. Here are a few things to keep in mind:  When you talk about ASD, emphasize positives about your loved one's abilities and what makes him or her special.  Ask your loved one if he or she would like to learn more by watching videos or reading books about ASD.  Listening is important. Be available if your loved one wants to talk, but give your loved one space if he or she does not feel like talking.  Find support and resources There are a number of different resources that you can use to better support your loved one. A health care provider may be able to recommend resources. You could start with:  Government sites, such as: ? Marine scientistCenters for Disease Control and Prevention: FootballExhibition.com.brwww.cdc.gov ? [employer] Institute of Mental Health: http://www.maynard.net/www.nimh.nih.gov  National autism organizations, such as: ? Autism Speaks: www.autismspeaks.org ? Autism Navigator: www.autismnavigator.com  Think about joining self-help and support groups, not only for your friend or family member, but also for yourself. People in these peer and family support groups understand what you and your loved one are going  through. They can help you feel a sense of hope and connect you with local resources to help you learn more. General support  Help your loved one follow his or her treatment plan as directed by health care providers. This could mean driving him or her to behavioral therapy or speech therapy sessions.  Make an effort to learn all you can about ASD. How can I create a safe environment? To keep your loved one safe, create a structured setting at home and at work or school. People with ASD get overwhelmed easily if there are too many objects, noises, or visual distractions around them. Consider doing the following:  Make the home and work or school settings free of distractions, clutter, and unnecessary noise.  Arrange furniture based on predicted behavior and movement.  Use locks and alarms where necessary.  Cover electrical outlets.  Hide and put away dangerous objects. This includes removing or locking up guns and other weapons. If you do not have a safe place to keep a gun, local law enforcement may store a gun for you.  Tie utensils to chairs in case your loved one throws those items at mealtime.  Label and organize household items.  Include visual signs in the home.  Also, make a written emergency plan. Include important phone numbers, such as a local crisis hotline. Make sure that:  The person with ASD knows about this plan.  Everyone who has regular contact with the person knows about the plan  and knows what to do in an emergency.  Ask a counselor or your loved one's health care provider about when to get help if you are concerned about behavior changes. Privacy laws limit how much a person's health care provider can share with you (for adults), but if you feel that a situation is an emergency, do not wait to call a health care provider or emergency services. How should I care for myself? Supporting someone with ASD can cause stress. It is important to find ways to care for your  body, mind, and well-being.  Find someone you can talk to who will help you work on using coping skills to manage stress.  Try to maintain your normal routines. This can help you remember that your life is about more than your loved one's condition.  Understand what your limits are. Say "no" to requests or events that lead to a schedule that is too busy.  Make time for activities that you enjoy, and try to not feel guilty about taking time for yourself.  Consider trying meditation and deep breathing exercises.  Get plenty of sleep.  Set aside time to be alone and relax.  Exercise, even if it is just taking a short walk a few times a week.  Get help right away if:  You are in a situation that threatens your life. Leave the situation and call emergency services (911 in the [place] as soon as possible. If you ever feel like your loved one may hurt himself or herself or others, or may have thoughts about taking his or her own life, get help right away. You can go to your nearest emergency department or call:  Your local emergency services (911 in the [place]  A suicide crisis helpline, such as the National Suicide Prevention Lifeline at (937) 233-8826. This is open 24 hours a day.  Summary  Your loved one with ASD will need your support, especially if his or her symptoms are severe.  You will be in a better position to help your loved one if you understand his or her diagnosis and needs. Connect with supportive resources for families with ASD in your community.  Make sure to take care of your own physical and mental health. This information is not intended to replace advice given to you by your health care provider. Make sure you discuss any questions you have with your health care provider. Document Released: 02/28/2017 Document Revised: 02/28/2017 Document Reviewed: 02/28/2017 Elsevier Interactive Patient Education  2018 [employer].

## 2018-01-29 NOTE — Telephone Encounter (Signed)
Copied from CRM [id number]. Topic: Quick Communication - See Telephone Encounter >> Jan 29, 2018 11:22 AM Terisa Starraylor, Brittany L wrote: CRM for notification. See Telephone encounter for: 01/29/18.  Pharmacy needs clarification on divalproex (DEPAKOTE ER) 250 MG 24 hr tablet due to a huge change in the dose.  Please call at [phone]

## 2018-01-29 NOTE — Progress Notes (Signed)
Name: Erik Henderson   MRN: [id number]    DOB: 1961-01-26   Date:01/29/2018       Progress Note  Subjective  Chief Complaint  Chief Complaint  Patient presents with  . Medication Reaction  . Fussy    HPI  Patient presents with brother, sister- Lisbeth Ply and [name]; live with patient. To the office for change in behavior. Family first called on 12/11/17 for patients change in behavior stating he was "acting like a zombie". PCP reccommended follow-up with psychiatry to adjust medications- xanax XR/ depakote.; appointment at [facility] was made by staff on 3/13- but patient got sick with the flu and was unable to make appointment.  Siblings states patient gets very restless and agitated; walking in a trance like state that is not his norm- believes this occurred when he started getting the generic divalproex. Patient was seeing Dr. Allie Dimmer was his psychiatrist initially switched from [facility] to [facility] but family has been unable to reach him.   Baseline:  Patient is always rocking, smiling and humming all the time- he appears to be in good spirits. He sits down and is responsive- non-verbal but he responds to commands such as "bring me x an he would bring it. He was calm overall. Patient would sleep well on his own about 8-10 hours.   Now: Patient no longer responds to commands- when they ask him to sit or lay down he does not follow commands. He may sit down for a few seconds and then gets back up- requires constant reinforcement. Has a lot of repetitive movements- back and forth from the bathroom; or constantly drinking water. He gets very agitated and looks blankly at the ceiling. patient normally gets very little sleep if any- needs benadryl and melatonin to help him calm down. The benadryl and melatonin helps calm him down a little to get him a little bit of sleep.   Mother stopped giving him the divalproex (generic) early March. Still taking Risperidone BID, Takes  alprazolam XR PRN-  Maybe received 10 doses in the last 2 weeks. States has steady gait- no falls, has bumped into things when he's hyperactive and running around. No injuries noted. No head trauma.    Patient Active Problem List   Diagnosis Date Noted  . Anxiety 04/14/2015  . Cognitive impairment 04/14/2015  . Autism 02/23/2015  . Hay fever 02/21/2008    Social History   Tobacco Use  . Smoking status: Never Smoker  . Smokeless tobacco: Never Used  Substance Use Topics  . Alcohol use: No    Alcohol/week: 0.0 oz     Current Outpatient Medications:  .  ALPRAZolam (XANAX XR) 0.5 MG 24 hr tablet, Take 1 tablet (0.5 mg total) by mouth daily., Disp: 30 tablet, Rfl: 2 .  divalproex (DEPAKOTE) 250 MG DR tablet, TAKE 1 TABLET TWICE DAILY AND 2 TABLETS IN THE EVENING, Disp: 120 tablet, Rfl: 5 .  fexofenadine (ALLEGRA) 180 MG tablet, Take 1 tablet (180 mg total) by mouth daily., Disp: 30 tablet, Rfl: 0 .  risperiDONE (RISPERDAL) 2 MG tablet, Take 1 tablet (2 mg total) by mouth 2 (two) times daily., Disp: 180 tablet, Rfl: 0  No Known Allergies  ROS   Constitutional: Positive activity change Negative or weight change.  Respiratory: Positive for dry ough and NO shortness of breath.   Gastrointestinal: Negative no bowel changes.  Musculoskeletal: Negative for gait problem or joint swelling.  Skin: Negative for rash.  Neurological/Psychiatric: Positive behavior change  Negative for seizure like activity, weakness No other specific complaints in a complete review of systems (except as listed in HPI above).  Objective  Vitals:   01/29/18 0828  BP: 120/80  Pulse: 84  Resp: 16  Temp: 98.8 F (37.1 C)  TempSrc: Oral  SpO2: 96%  Weight: 182 lb 12.8 oz (82.9 kg)  Height: 5\' 9"  (1.753 m)     Body mass index is 26.99 kg/m.  Nursing Note and Vital Signs reviewed.  Physical Exam   Constitutional: Patient appears well-developed and well-nourished. Rocking back and forth on exam  table, re-directed by sister multiple times when trying to get off of table.  HEENT: head atraumatic, normocephalic, pupils equal and reactive to light, neck supple without lymphadenopathy or thyromegaly,  Cardiovascular: Normal rate, regular rhythm, S1/S2 present. Pulses intact.  Pulmonary/Chest: Effort normal and breath sounds clear. No respiratory distress or retractions. Pt humming  Abdominal: Soft and non-tender, bowel sounds present  Psychiatric: Patient - unbothered, looking blankly straight ahead then causualy around room, affect is flat, . Behavior alternates from calmly sitting on exam table and humming to jumping up of table randomly.   No results found for this or any previous visit (from the past 72 hour(s)).  Assessment & Plan  1. Behavioral change - discussed safety, start back up on brand Depakote, follow up with psychiatry  - Ambulatory referral to Psychiatry - divalproex (DEPAKOTE ER) 250 MG 24 hr tablet; Take 1 tablet (250 mg total) by mouth daily.  Dispense: 90 tablet; Refill: 0  2. Autism  - Ambulatory referral to Psychiatry - divalproex (DEPAKOTE ER) 250 MG 24 hr tablet; Take 1 tablet (250 mg total) by mouth daily.  Dispense: 90 tablet; Refill: 0  3. Anxiety -Continue xanax PRN, caution against oversedation with additional benadryl  - Ambulatory referral to Psychiatry  4. Cognitive impairment  - Ambulatory referral to Psychiatry   -Red flags and when to present for emergency care or RTC including fever >101.48F, chest pain, shortness of breath, new/worsening/un-resolving symptoms, behavioral outbursts, safety concerns reviewed with patient at time of visit. Follow up and care instructions discussed and provided in AVS.

## 2018-02-05 DIAGNOSIS — M25441 Effusion, right hand: Secondary | ICD-10-CM | POA: Diagnosis not present

## 2018-02-05 DIAGNOSIS — F69 Unspecified disorder of adult personality and behavior: Secondary | ICD-10-CM | POA: Diagnosis not present

## 2018-02-05 DIAGNOSIS — F840 Autistic disorder: Secondary | ICD-10-CM | POA: Diagnosis not present

## 2018-02-05 DIAGNOSIS — M25442 Effusion, left hand: Secondary | ICD-10-CM | POA: Diagnosis not present

## 2018-02-05 DIAGNOSIS — M7989 Other specified soft tissue disorders: Secondary | ICD-10-CM | POA: Diagnosis not present

## 2018-02-05 DIAGNOSIS — R4189 Other symptoms and signs involving cognitive functions and awareness: Secondary | ICD-10-CM | POA: Diagnosis not present

## 2018-02-05 DIAGNOSIS — R6250 Unspecified lack of expected normal physiological development in childhood: Secondary | ICD-10-CM | POA: Diagnosis not present

## 2018-02-06 ENCOUNTER — Ambulatory Visit (INDEPENDENT_AMBULATORY_CARE_PROVIDER_SITE_OTHER): Payer: Medicare Other | Admitting: Family Medicine

## 2018-02-06 ENCOUNTER — Encounter: Payer: Self-pay | Admitting: Family Medicine

## 2018-02-06 VITALS — BP 130/90 | HR 104 | Resp 16 | Ht 69.0 in | Wt 181.2 lb

## 2018-02-06 DIAGNOSIS — R451 Restlessness and agitation: Secondary | ICD-10-CM | POA: Diagnosis not present

## 2018-02-06 DIAGNOSIS — F840 Autistic disorder: Secondary | ICD-10-CM

## 2018-02-06 DIAGNOSIS — S60221A Contusion of right hand, initial encounter: Secondary | ICD-10-CM | POA: Diagnosis not present

## 2018-02-06 MED ORDER — LORAZEPAM 0.5 MG PO TABS: 0.5000 mg | ORAL_TABLET | Freq: Every day | ORAL | 0 refills | Status: DC | PRN

## 2018-02-06 NOTE — Progress Notes (Signed)
Name: Erik Henderson   MRN: [id number]    DOB: June 25, 1961   Date:02/06/2018       Progress Note  Subjective  Chief Complaint  Chief Complaint  Patient presents with  . Behavior Problem    Family members state that patient has been having heavy breathing, excessives sweats, rapid heartbeat, loss of appetite, not sleeping, aggitated, in a fog like state of mind, paces back and forth continuously since changing his Depakote. They would like for him to be placed back on Depakote.    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 Family states he has been more agitated over the past weekend. He was seen by Sharyon Cable for increase in agitation on 01/29/2018, he was out of Depakote at the time and family had stopped giving him alprazolam XR because it made him act " like a zombie". Over the past weekend agitation got progressively worse and he tried to leave the house and kept hitting the door with both hands. His hands have been swollen since. He has been unable to sleep or eat. His breathing seems to be heavier than usual. He went to [facility] yesterday and labs reviewed, he has mild anemia, normal basic panel. They advised follow up with psychiatrist. We had already schedule appointment with psychiatrist for March, but he was sick with the flu. We will try to re-schedule it asap. Family states they did not pick up rx of depakote, therefore he has been also of benzo and depakote. Advised to resume depakote we will give him 30 days of clonopin only, after that is up to psychiatrist to manage his medications.   He is here today with sister [name] and mother Dewayne Hatch   Patient Active Problem List   Diagnosis Date Noted  . Anxiety 04/14/2015  . Cognitive impairment 04/14/2015  . Autism 02/23/2015  . Hay fever 02/21/2008    Past Surgical History:  Procedure Laterality Date  . NO PAST SURGERIES      Family History  Problem Relation Age of Onset  . Hypertension Mother   . Hyperlipidemia  Mother   . Hypertension Father   . Coronary artery disease Father   . Hypertension Brother   . Autism Brother   . Mental retardation Brother     Social History   Socioeconomic History  . Marital status: Single    Spouse name: Not on file  . Number of children: 0  . Years of education: Not on file  . Highest education level: Never attended school  Occupational History  . Occupation: disabled  Social Needs  . Financial resource strain: Somewhat hard  . Food insecurity:    Worry: Never true    Inability: Never true  . Transportation needs:    Medical: No    Non-medical: No  Tobacco Use  . Smoking status: Never Smoker  . Smokeless tobacco: Never Used  Substance and Sexual Activity  . Alcohol use: No    Alcohol/week: 0.0 oz  . Drug use: No  . Sexual activity: Never  Lifestyle  . Physical activity:    Days per week: 0 days    Minutes per session: 0 min  . Stress: Not on file  Relationships  . Social connections:    Talks on phone: Not on file    Gets together: Not on file    Attends religious service: Not on file    Active member of club or organization: Not on file    Attends  meetings of clubs or organizations: Not on file    Relationship status: Not on file  . Intimate partner violence:    Fear of current or ex partner: Not on file    Emotionally abused: Not on file    Physically abused: Not on file    Forced sexual activity: Not on file  Other Topics Concern  . Not on file  Social History Narrative   Lives with mother, brothers and sisters.    On disability since birth            Current Outpatient Medications:  .  risperiDONE (RISPERDAL) 2 MG tablet, Take 1 tablet (2 mg total) by mouth 2 (two) times daily., Disp: 180 tablet, Rfl: 0 .  divalproex (DEPAKOTE) 250 MG DR tablet, Take 1 tablet (250 mg total) by mouth See admin instructions. TAKE 1 TABLET TWICE DAILY AND 2 TABLETS IN THE EVENING (Patient not taking: Reported on 02/06/2018), Disp: 120 tablet, Rfl:  1 .  LORazepam (ATIVAN) 0.5 MG tablet, Take 1 tablet (0.5 mg total) by mouth daily as needed for anxiety., Disp: 30 tablet, Rfl: 0  No Known Allergies   ROS  Ten systems reviewed and is negative except as mentioned in HPI  No bladder changes, no diarrhea or change in bowel movements, no fever  Objective  Vitals:   02/06/18 1544  BP: 130/90  Pulse: (!) 104  Resp: 16  Weight: 181 lb 3.2 oz (82.2 kg)  Height: 5\' 9"  (1.753 m)    Body mass index is 26.76 kg/m.  Physical Exam  Constitutional: Patient appears well-developed and well-nourished. HEENT: head atraumatic, normocephalic, pupils equal and reactive to light,neck supple, throat within normal limits Cardiovascular: Normal rate, regular rhythm and normal heart sounds.  No murmur heard.. Pulmonary/Chest: Effort normal and breath sounds normal. No respiratory distress. Abdominal: Soft.  There is no tenderness. Psychiatric:Mute, seems a little agitated, starring at mother and sister , able to follow simple command such as closing hand.  Muscular skeletal: edema of both hands, worse on the right, with some ecchymosis and mild erythema dorsal right hand   Recent Results (from the past 2160 hour(s))  TSH     Status: None   Collection Time: 12/07/17 10:20 AM  Result Value Ref Range   TSH 2.49 0.40 - 4.50 mIU/L  Comprehensive metabolic panel     Status: Abnormal   Collection Time: 12/07/17 10:20 AM  Result Value Ref Range   Glucose, Bld 78 65 - 99 mg/dL    Comment: .            Fasting reference interval .    BUN 7 7 - 25 mg/dL   Creat 2.95 6.21 - 3.08 mg/dL    Comment: For patients >54 years of age, the reference limit for Creatinine is approximately 13% higher for people identified as African-American. .    BUN/Creatinine Ratio NOT APPLICABLE 6 - 22 (calc)   Sodium 138 135 - 146 mmol/L   Potassium 4.1 3.5 - 5.3 mmol/L   Chloride 101 98 - 110 mmol/L   CO2 27 20 - 32 mmol/L   Calcium 9.5 8.6 - 10.3 mg/dL   Total  Protein 8.0 6.1 - 8.1 g/dL   Albumin 4.1 3.6 - 5.1 g/dL   Globulin 3.9 (H) 1.9 - 3.7 g/dL (calc)   AG Ratio 1.1 1.0 - 2.5 (calc)   Total Bilirubin 0.4 0.2 - 1.2 mg/dL   Alkaline phosphatase (APISO) 58 40 - 115 U/L   AST  17 10 - 35 U/L   ALT 9 9 - 46 U/L  CBC with Differential/Platelet     Status: None   Collection Time: 12/07/17 10:20 AM  Result Value Ref Range   WBC 4.8 3.8 - 10.8 Thousand/uL   RBC 4.63 4.20 - 5.80 Million/uL   Hemoglobin 13.4 13.2 - 17.1 g/dL   HCT 40.939.2 81.138.5 - 91.450.0 %   MCV 84.7 80.0 - 100.0 fL   MCH 28.9 27.0 - 33.0 pg   MCHC 34.2 32.0 - 36.0 g/dL   RDW 78.212.4 95.611.0 - 21.315.0 %   Platelets 207 140 - 400 Thousand/uL   MPV 10.1 7.5 - 12.5 fL   Neutro Abs 2,602 1,500 - 7,800 cells/uL   Lymphs Abs 1,488 850 - 3,900 cells/uL   WBC mixed population 581 200 - 950 cells/uL   Eosinophils Absolute 82 15 - 500 cells/uL   Basophils Absolute 48 0 - 200 cells/uL   Neutrophils Relative % 54.2 %   Total Lymphocyte 31.0 %   Monocytes Relative 12.1 %   Eosinophils Relative 1.7 %   Basophils Relative 1.0 %  Hemoglobin A1c     Status: None   Collection Time: 12/07/17 10:20 AM  Result Value Ref Range   Hgb A1c MFr Bld 5.5 <5.7 % of total Hgb    Comment: For the purpose of screening for the presence of diabetes: . <5.7%       Consistent with the absence of diabetes 5.7-6.4%    Consistent with increased risk for diabetes             (prediabetes) > or =6.5%  Consistent with diabetes . This assay result is consistent with a decreased risk of diabetes. . Currently, no consensus exists regarding use of hemoglobin A1c for diagnosis of diabetes in children. . According to American Diabetes Association (ADA) guidelines, hemoglobin A1c <7.0% represents optimal control in non-pregnant diabetic patients. Different metrics may apply to specific patient populations.  Standards of Medical Care in Diabetes(ADA). .    Mean Plasma Glucose 111 (calc)   eAG (mmol/L) 6.2 (calc)  Lipid  panel     Status: None   Collection Time: 12/07/17 10:20 AM  Result Value Ref Range   Cholesterol 156 <200 mg/dL   HDL 58 >08>40 mg/dL   Triglycerides 76 <657<150 mg/dL   LDL Cholesterol (Calc) 82 mg/dL (calc)    Comment: Reference range: <100 . Desirable range <100 mg/dL for primary prevention;   <70 mg/dL for patients with CHD or diabetic patients  with > or = 2 CHD risk factors. Marland Kitchen. LDL-C is now calculated using the Martin-Hopkins  calculation, which is a validated novel method providing  better accuracy than the Friedewald equation in the  estimation of LDL-C.  Horald PollenMartin SS et al. Lenox AhrJAMA. 8469;629(522013;310(19): 2061-2068  (http://education.[facility].com/faq/FAQ164)    Total CHOL/HDL Ratio 2.7 <5.0 (calc)   Non-HDL Cholesterol (Calc) 98 <841<130 mg/dL (calc)    Comment: For patients with diabetes plus 1 major ASCVD risk  factor, treating to a non-HDL-C goal of <100 mg/dL  (LDL-C of <32<70 mg/dL) is considered a therapeutic  option.   Valproic Acid level     Status: None   Collection Time: 12/15/17 11:22 AM  Result Value Ref Range   Valproic Acid Lvl 69.3 50.0 - 100.0 mg/L     PHQ2/9: Depression screen [facility] 06/09/2017 06/06/2016 08/17/2015 04/14/2015  Decreased Interest 0 0 0 0  Down, Depressed, Hopeless 0 0 0 0  PHQ - 2 Score  0 0 0 0    Unable  Fall Risk: Fall Risk  02/06/2018 12/07/2017 09/08/2017 06/09/2017 11/15/2016  Falls in the past year? No No No No No      Functional Status Survey: Is the patient deaf or have difficulty hearing?: No Does the patient have difficulty seeing, even when wearing glasses/contacts?: No Does the patient have difficulty concentrating, remembering, or making decisions?: Yes Does the patient have difficulty walking or climbing stairs?: No Does the patient have difficulty dressing or bathing?: Yes Does the patient have difficulty doing errands alone such as visiting a doctor's office or shopping?: Yes    Assessment & Plan  1. Agitation  - LORazepam  (ATIVAN) 0.5 MG tablet; Take 1 tablet (0.5 mg total) by mouth daily as needed for anxiety.  Dispense: 30 tablet; Refill: 0 - Ambulatory referral to Psychiatry  2. Autism  - LORazepam (ATIVAN) 0.5 MG tablet; Take 1 tablet (0.5 mg total) by mouth daily as needed for anxiety.  Dispense: 30 tablet; Refill: 0 - Ambulatory referral to Psychiatry  3. Traumatic ecchymosis of right hand, initial encounter  - Ambulatory referral to Orthopedic Surgery  Take him back to [facility] if no improvement or worsening of symptoms.

## 2018-02-07 DIAGNOSIS — R600 Localized edema: Secondary | ICD-10-CM | POA: Diagnosis not present

## 2018-02-07 DIAGNOSIS — M79641 Pain in right hand: Secondary | ICD-10-CM | POA: Diagnosis not present

## 2018-02-07 DIAGNOSIS — M79642 Pain in left hand: Secondary | ICD-10-CM | POA: Diagnosis not present

## 2018-02-08 DIAGNOSIS — R600 Localized edema: Secondary | ICD-10-CM | POA: Diagnosis not present

## 2018-02-09 NOTE — Progress Notes (Signed)
Letter has been printed and mailed to patient's home address.

## 2018-02-12 DIAGNOSIS — M79641 Pain in right hand: Secondary | ICD-10-CM | POA: Diagnosis not present

## 2018-02-12 DIAGNOSIS — M79642 Pain in left hand: Secondary | ICD-10-CM | POA: Diagnosis not present

## 2018-02-12 DIAGNOSIS — R600 Localized edema: Secondary | ICD-10-CM | POA: Diagnosis not present

## 2018-02-28 ENCOUNTER — Other Ambulatory Visit: Payer: Self-pay | Admitting: Family Medicine

## 2018-02-28 ENCOUNTER — Telehealth: Payer: Self-pay

## 2018-02-28 DIAGNOSIS — R451 Restlessness and agitation: Secondary | ICD-10-CM

## 2018-02-28 DIAGNOSIS — F840 Autistic disorder: Secondary | ICD-10-CM

## 2018-02-28 NOTE — Telephone Encounter (Signed)
Copied from CRM #[id number]. Topic: Referral - Request >> Feb 28, 2018  2:48 PM Harrald, Kathy J wrote: Reason for CRM: Erik Henderson calling to request the dr refer the pt to a neurologist as soon as possible. She states they did receive his new medicaid card if you need to see it. She states something is still not right with the pt. Does not get any rest, up and down all night, drinking lots of water, acting like he doesn't hear people when they speak to him (pays no attention) She states this has been going on a few months. Pt has a few days normal, them goes back to the other way. Requesting neuro referral asap.  Erik seems to think this started after he started taking that generic med that was switched to the name brand. (She thinks maybe side effects ot that med)  

## 2018-02-28 NOTE — Telephone Encounter (Signed)
Copied from CRM [id number]. Topic: Quick Communication - See Telephone Encounter >> Feb 28, 2018  4:19 PM Trula Slade wrote: CRM for notification. See Telephone encounter for: 02/28/18. Patient needs a refill on his LORazepam (ATIVAN) 0.5 MG tablet medication and sent to his preferred pharmacy [facility] in [place], [place].  Patient is walking back and forth like a zombie his sister Daiel Strohecker says.

## 2018-03-01 NOTE — Telephone Encounter (Signed)
Refill request for general medication: Ativan 0.5 mg  Last office visit: 02/06/2018  Last physical exam: None indicated  Follow-ups on file. 03/09/2018

## 2018-03-01 NOTE — Telephone Encounter (Signed)
Although there is no referral on file for a neurologist, patient's sister has been informed by several staff members that we cannot proceed with any referrals without having a copy of his insurance card on file.   They have be asked and strongly encouraged to bring a copy of his card by the office as soon as possible but we have yet to received the card.

## 2018-03-02 ENCOUNTER — Telehealth: Payer: Self-pay

## 2018-03-02 NOTE — Telephone Encounter (Signed)
Copied from CRM [id number]. Topic: Referral - Request >> Feb 28, 2018  2:48 PM Crist Infante wrote: Reason for CRM: Erik Henderson calling to request the dr refer the pt to a neurologist as soon as possible. She states they did receive his new medicaid card if you need to see it. She states something is still not right with the pt. Does not get any rest, up and down all night, drinking lots of water, acting like he doesn't hear people when they speak to him (pays no attention) She states this has been going on a few months. Pt has a few days normal, them goes back to the other way. Requesting neuro referral asap.  Erik Henderson seems to think this started after he started taking that generic med that was switched to the name brand. (She thinks maybe side effects ot that med)

## 2018-03-02 NOTE — Telephone Encounter (Signed)
sisiter called back to ask if any sooner appts for the pt.  Advised sister there are not any, and advised her again to bring a copy of pt's new card. No referral can be placed for the pt until then.

## 2018-03-09 ENCOUNTER — Ambulatory Visit (INDEPENDENT_AMBULATORY_CARE_PROVIDER_SITE_OTHER): Payer: Medicare Other | Admitting: Family Medicine

## 2018-03-09 ENCOUNTER — Encounter: Payer: Self-pay | Admitting: Family Medicine

## 2018-03-09 ENCOUNTER — Ambulatory Visit (INDEPENDENT_AMBULATORY_CARE_PROVIDER_SITE_OTHER): Payer: Medicare Other

## 2018-03-09 VITALS — BP 118/82 | HR 78 | Temp 98.3°F | Resp 16 | Ht 69.0 in | Wt 176.9 lb

## 2018-03-09 DIAGNOSIS — D649 Anemia, unspecified: Secondary | ICD-10-CM

## 2018-03-09 DIAGNOSIS — R569 Unspecified convulsions: Secondary | ICD-10-CM | POA: Diagnosis not present

## 2018-03-09 DIAGNOSIS — F840 Autistic disorder: Secondary | ICD-10-CM | POA: Diagnosis not present

## 2018-03-09 DIAGNOSIS — R451 Restlessness and agitation: Secondary | ICD-10-CM

## 2018-03-09 DIAGNOSIS — Z0000 Encounter for general adult medical examination without abnormal findings: Secondary | ICD-10-CM | POA: Diagnosis not present

## 2018-03-09 DIAGNOSIS — H539 Unspecified visual disturbance: Secondary | ICD-10-CM | POA: Diagnosis not present

## 2018-03-09 DIAGNOSIS — E663 Overweight: Secondary | ICD-10-CM | POA: Diagnosis not present

## 2018-03-09 DIAGNOSIS — R4189 Other symptoms and signs involving cognitive functions and awareness: Secondary | ICD-10-CM

## 2018-03-09 DIAGNOSIS — Z1211 Encounter for screening for malignant neoplasm of colon: Secondary | ICD-10-CM

## 2018-03-09 DIAGNOSIS — R350 Frequency of micturition: Secondary | ICD-10-CM | POA: Diagnosis not present

## 2018-03-09 DIAGNOSIS — R739 Hyperglycemia, unspecified: Secondary | ICD-10-CM | POA: Diagnosis not present

## 2018-03-09 DIAGNOSIS — F419 Anxiety disorder, unspecified: Secondary | ICD-10-CM

## 2018-03-09 DIAGNOSIS — Z599 Problem related to housing and economic circumstances, unspecified: Secondary | ICD-10-CM

## 2018-03-09 DIAGNOSIS — Z598 Other problems related to housing and economic circumstances: Secondary | ICD-10-CM

## 2018-03-09 MED ORDER — LORAZEPAM 0.5 MG PO TABS: 0.5000 mg | ORAL_TABLET | Freq: Every day | ORAL | 0 refills | Status: DC | PRN

## 2018-03-09 MED ORDER — RISPERIDONE 2 MG PO TABS: 2.0000 mg | ORAL_TABLET | Freq: Two times a day (BID) | ORAL | 0 refills | Status: DC

## 2018-03-09 NOTE — Progress Notes (Signed)
Name: Erik Henderson   MRN: [id number]    DOB: [birth year]   Date:03/09/2018       Progress Note  Subjective  Chief Complaint  Chief Complaint  Patient presents with  . Follow-up  . 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 Family states he has been more agitated over the past 5weeks. Marland Kitchen He was seen by Sharyon Cable for increase in agitation on 01/29/2018, he was out of Depakote at the time and family had stopped giving him alprazolam XR because it made him act " like a zombie". The  agitation got progressively worse and he tried to leave the house and kept hitting the door with both hands, he even fracture the right hand during an episode of agitation. He goes days without sleeping and at times but no change in appetite. He has been pacing the floor more often. He went to [facility] 02/05/2018. They advised follow up with psychiatrist, and we placed an appointment but caregiver finally got active Medicaid card, but still pending the Medicare card.Explained that I can increase Risperdal by one extra tablet, but he really needs to see psychiatrist, can also take him to [facility] for admission if they think he is dangerous to self of others.   Hand hematoma: from recent fracture, seen at [facility]. He was given a brace, but not wearing today, advised follow up as recommended by ortho  Seizure disorder: caregivers states he has been seizure free for many years, but they are afraid of stopping medication. Dose has been adjusted and last valproic acid level wasat goal.No side effects of medication. Reviewed recent labs  Insulin Resistance: mother has diabetes, he is on diabetic diet, his weight has been stable, no change in appetite, no polyuria, but likes to drink a lot of water. They make sure he does not over drink. . Last insulin level was elevated. Last urine culture negative. .last hgbA1C  5.5%  Dyslipidemia: on life style modification, last level was at goal   Patient Active Problem List   Diagnosis Date Noted  . Hyperglycemia 08/17/2015  . Anxiety 04/14/2015  . Cognitive impairment 04/14/2015  . Autism 02/23/2015  . Hay fever 02/21/2008    Past Surgical History:  Procedure Laterality Date  . NO PAST SURGERIES      Family History  Problem Relation Age of Onset  . Hypertension Mother   . Hyperlipidemia Mother   . Hypertension Father   . Coronary artery disease Father   . Hypertension Brother   . Autism Brother   . Mental retardation Brother     Social History   Socioeconomic History  . Marital status: Single    Spouse name: Not on file  . Number of children: 0  . Years of education: Not on file  . Highest education level: Never attended school  Occupational History  . Occupation: disabled  Social Needs  . Financial resource strain: Somewhat hard  . Food insecurity:    Worry: Never true    Inability: Never true  . Transportation needs:    Medical: No    Non-medical: No  Tobacco Use  . Smoking status: Never Smoker  . Smokeless tobacco: Never Used  Substance and Sexual Activity  . Alcohol use: No    Alcohol/week: 0.0 oz  . Drug use: No  . Sexual activity: Never  Lifestyle  .  Physical activity:    Days per week: 0 days    Minutes per session: 0 min  . Stress: Not on file  Relationships  . Social connections:    Talks on phone: Not on file    Gets together: Not on file    Attends religious service: Not on file    Active member of club or organization: Not on file    Attends meetings of clubs or organizations: Not on file    Relationship status: Not on file  . Intimate partner violence:    Fear of current or ex partner: Not on file    Emotionally abused: Not on file    Physically abused: Not on file    Forced sexual activity: Not on file  Other Topics Concern  . Not on file  Social History Narrative   Lives with mother, brothers  and sisters.    On disability since birth            Current Outpatient Medications:  .  divalproex (DEPAKOTE) 250 MG DR tablet, Take 1 tablet (250 mg total) by mouth See admin instructions. TAKE 1 TABLET TWICE DAILY AND 2 TABLETS IN THE EVENING, Disp: 120 tablet, Rfl: 1 .  LORazepam (ATIVAN) 0.5 MG tablet, Take 1 tablet (0.5 mg total) by mouth daily as needed for anxiety., Disp: 30 tablet, Rfl: 0 .  risperiDONE (RISPERDAL) 2 MG tablet, Take 1 tablet (2 mg total) by mouth 2 (two) times daily., Disp: 180 tablet, Rfl: 0  No Known Allergies   ROS  Unable to ask patient, has swelling on dorsal aspect of right hand, good eye contact, non-verbal.   Objective  Vitals:   03/09/18 1312  BP: 118/82  Pulse: 78  Resp: 16  Temp: 98.3 F (36.8 C)  TempSrc: Oral  SpO2: 98%  Weight: 176 lb 14.4 oz (80.2 kg)  Height:  (1.753 m)    Body mass index is 26.12 kg/m.  Physical Exam  Constitutional: Patient appears well-developed and well-nourished.No distress.  HEENT: head atraumatic, normocephalic, pupils equal and reactive to light,  neck supple, throat within normal limits Cardiovascular: Normal rate, regular rhythm and normal heart sounds.  No murmur heard. No BLE edema. Pulmonary/Chest: Effort normal and breath sounds normal. No respiratory distress. Abdominal: Soft.  There is no tenderness. Psychiatric: non verbal, calm, good eye contact, not humming today   Recent Results (from the past 2160 hour(s))  Valproic Acid level     Status: None   Collection Time: 12/15/17 11:22 AM  Result Value Ref Range   Valproic Acid Lvl 69.3 50.0 - 100.0 mg/L      PHQ2/9: Depression screen [facility] 2/9 06/09/2017 06/06/2016 08/17/2015 04/14/2015  Decreased Interest 0 0 0 0  Down, Depressed, Hopeless 0 0 0 0  PHQ - 2 Score 0 0 0 0     Fall Risk: Fall Risk  02/06/2018 12/07/2017 09/08/2017 06/09/2017 11/15/2016  Falls in the past year? No No No No No     Assessment & Plan  1. Seizure  (HCC)  Symptoms free for years   2. Colon cancer screening  - Cologuard  3. Cognitive impairment   4. Autism  - LORazepam (ATIVAN) 0.5 MG tablet; Take 1 tablet (0.5 mg total) by mouth daily as needed for anxiety.  Dispense: 30 tablet; Refill: 0 - risperiDONE (RISPERDAL) 2 MG tablet; Take 1-2 tablets (2-4 mg total) by mouth 2 (two) times daily. One in am and 2 in pm  Dispense: 270 tablet;  Refill: 0  5. Hyperglycemia   6. Overweight (BMI 25.0-29.9)  Lost weight since last visit Discussed with the patient the risk posed by an increased BMI. Discussed importance of portion control, calorie counting and at least 150 minutes of physical activity weekly. Avoid sweet beverages and drink more water. Eat at least 6 servings of fruit and vegetables daily   7. Anxiety  - LORazepam (ATIVAN) 0.5 MG tablet; Take 1 tablet (0.5 mg total) by mouth daily as needed for anxiety.  Dispense: 30 tablet; Refill: 0  8. Agitation  - LORazepam (ATIVAN) 0.5 MG tablet; Take 1 tablet (0.5 mg total) by mouth daily as needed for anxiety.  Dispense: 30 tablet; Refill: 0 - risperiDONE (RISPERDAL) 2 MG tablet; Take 1-2 tablets (2-4 mg total) by mouth 2 (two) times daily. One in am and 2 in pm  Dispense: 270 tablet; Refill: 0 - TSH - Urine Culture   9. Anemia, unspecified type  - CBC with Differential/Platelet - Iron, TIBC and Ferritin Panel; Future  10. Urinary frequency  - Urine Culture

## 2018-03-09 NOTE — Progress Notes (Addendum)
Subjective:   Erik Henderson is a 57 y.o. male who presents for Medicare Annual/Subsequent preventive examination.  Review of Systems:  N/A Cardiac Risk Factors include: advanced age (>35men, >52 women);dyslipidemia;male gender;sedentary lifestyle     Objective:    Vitals: BP 118/82 (BP Location: Right Arm, Patient Position: Sitting, Cuff Size: Normal)   Pulse 78   Temp 98.3 F (36.8 C) (Oral)   Resp 16   Ht  (1.753 m)   Wt 176 lb 14.4 oz (80.2 kg)   SpO2 98%   BMI 26.12 kg/m   Body mass index is 26.12 kg/m.  Advanced Directives 03/09/2018 08/07/2017 11/15/2016 09/06/2016 06/06/2016 08/17/2015 04/14/2015  Does Patient Have a Medical Advance Directive? Yes No Yes Yes Yes No Yes  Type of Diplomatic Services operational officer - Healthcare Power of Attorney Living will;Healthcare Power of [employer] Power of Attorney - Healthcare Power of Attorney  Does patient want to make changes to medical advance directive? - - - No - Patient declined No - Patient declined - -  Copy of Healthcare Power of Attorney in Chart? No - copy requested - - No - copy requested Yes - No - copy requested    Tobacco Social History   Tobacco Use  Smoking Status Never Smoker  Smokeless Tobacco Never Used  Tobacco Comment   smoking cessation materials not required     Counseling given: No Comment: smoking cessation materials not required  Clinical Intake:  Pre-visit preparation completed: Yes  Pain : No/denies pain   BMI - recorded: 26.12 Nutritional Status: BMI 25 -29 Overweight Nutritional Risks: None Diabetes: No  How often do you need to have someone help you when you read instructions, pamphlets, or other written materials from your doctor or pharmacy?: 5 - Always  Interpreter Needed?: No  Information entered by :: AEversole, LPN  Hospitalizations/ED visits and surgeries occurring within the previous 12 months:  Within the previous 12 months, pt has not underwent  any surgical procedures and has not been hospitalized for any conditions. However, within the previous 12 months, pt was seen on 02/05/18 @ [facility] for behavioral concern and swelling in hand. In addition pt was seen in follow up by Dr. Carlynn Purl on 02/06/18. Planning included:  Assessment & Plan  1. Agitation  - LORazepam (ATIVAN) 0.5 MG tablet; Take 1 tablet (0.5 mg total) by mouth daily as needed for anxiety.  Dispense: 30 tablet; Refill: 0 - Ambulatory referral to Psychiatry  2. Autism  - LORazepam (ATIVAN) 0.5 MG tablet; Take 1 tablet (0.5 mg total) by mouth daily as needed for anxiety.  Dispense: 30 tablet; Refill: 0 - Ambulatory referral to Psychiatry  3. Traumatic ecchymosis of right hand, initial encounter  - Ambulatory referral to Orthopedic Surgery  Take him back to [facility] if no improvement or worsening of symptoms.  Past Medical History:  Diagnosis Date  . Allergy   . Anxiety   . Autism   . Autistic disorder   . Mental disorder   . Mental retardation    Past Surgical History:  Procedure Laterality Date  . NO PAST SURGERIES     Family History  Problem Relation Age of Onset  . Hypertension Mother   . Hyperlipidemia Mother   . Hypertension Father   . Coronary artery disease Father   . Hypertension Brother   . Autism Brother   . Mental retardation Brother    Social History   Socioeconomic History  . Marital status: Single  Spouse name: Not on file  . Number of children: 0  . Years of education: Not on file  . Highest education level: Never attended school  Occupational History  . Occupation: disabled  Social Needs  . Financial resource strain: Somewhat hard  . Food insecurity:    Worry: Sometimes true    Inability: Sometimes true  . Transportation needs:    Medical: No    Non-medical: No  Tobacco Use  . Smoking status: Never Smoker  . Smokeless tobacco: Never Used  . Tobacco comment: smoking cessation materials not required  Substance and Sexual  Activity  . Alcohol use: No    Alcohol/week: 0.0 oz  . Drug use: No  . Sexual activity: Never  Lifestyle  . Physical activity:    Days per week: 0 days    Minutes per session: 0 min  . Stress: Not at all  Relationships  . Social connections:    Talks on phone: Patient refused    Gets together: Patient refused    Attends religious service: Patient refused    Active member of club or organization: Patient refused    Attends meetings of clubs or organizations: Patient refused    Relationship status: Patient refused  Other Topics Concern  . Not on file  Social History Narrative   Lives with mother, brothers and sisters.    On disability since birth           Outpatient Encounter Medications as of 03/09/2018  Medication Sig  . divalproex (DEPAKOTE) 250 MG DR tablet Take 1 tablet (250 mg total) by mouth See admin instructions. TAKE 1 TABLET TWICE DAILY AND 2 TABLETS IN THE EVENING  . LORazepam (ATIVAN) 0.5 MG tablet Take 1 tablet (0.5 mg total) by mouth daily as needed for anxiety.  . risperiDONE (RISPERDAL) 2 MG tablet Take 1-2 tablets (2-4 mg total) by mouth 2 (two) times daily. One in am and 2 in pm   No facility-administered encounter medications on file as of 03/09/2018.     Activities of Daily Living In your present state of health, do you have any difficulty performing the following activities: 03/09/2018 02/06/2018  Hearing? N N  Comment family denies hearing aids -  Vision? Y N  Comment family denies eyeglasses -  Difficulty concentrating or making decisions? (No Data) Y  Comment Autism -  Walking or climbing stairs? N N  Dressing or bathing? Malvin Johns  Comment family assists with care -  Doing errands, shopping? Malvin Johns  Comment family purchases groceries -  Quarry manager and eating ? Y -  Comment family denies dentures; family prepares meals -  Using the Toilet? N -  In the past six months, have you accidently leaked urine? N -  Do you have problems with loss of bowel  control? N -  Managing your Medications? Y -  Comment family manages medications -  Managing your Finances? Y -  Comment mother manages finances -  Housekeeping or managing your Housekeeping? Y -  Comment family manages home -  Some recent data might be hidden    Patient Care Team: Alba Cory, MD as PCP - General (Family Medicine)   Assessment:   This is a routine wellness examination for Adonus.  Exercise Activities and Dietary recommendations Current Exercise Habits: The patient does not participate in regular exercise at present, Exercise limited by: Other - see comments(autism)  Goals    . DIET - INCREASE WATER INTAKE     Recommend  to drink at least 6-8 8oz glasses of water per day.       Fall Risk Fall Risk  03/09/2018 02/06/2018 12/07/2017 09/08/2017 06/09/2017  Falls in the past year? No No No No No  Risk for fall due to : Other (Comment) - - - -  Risk for fall due to: Comment autism; shuffling gait - - - -   FALL RISK PREVENTION PERTAINING TO HOME: Is your home free of loose throw rugs in walkways, pet beds, electrical cords, etc? Yes Is there adequate lighting in your home to reduce risk of falls?  Yes Are there stairs in or around your home WITH handrails? Yes  ASSISTIVE DEVICES UTILIZED TO PREVENT FALLS: Use of a cane, walker or w/c? No Grab bars in the bathroom? Yes  Shower chair or a place to sit while bathing? No An elevated toilet seat or a handicapped toilet? No  Timed Get Up and Go Performed: Yes. Pt ambulated 10 feet within 15 sec. Gait slow, steady and without the use of an assistive device. Does ambulate with a shuffling gait. Requires verbal cues/guidance/direction from family or staff. No intervention required at this time. Fall risk prevention has been discussed.  Community Resource Referral:  Pt family declined my offer to send [employer] Referral to Care Guide for a shower chair or an elevated toilet seat.  Depression Screen PHQ 2/9  Scores 03/09/2018 09/08/2017 06/09/2017 06/06/2016  PHQ - 2 Score - - 0 0  Exception Documentation Other- indicate reason in comment box Other- indicate reason in comment box Other- indicate reason in comment box -  Not completed Autism Mental Handicap Caregivers state he has not been depressed or down -    Cognitive Function: Unable to perform d/t autism        Immunization History  Administered Date(s) Administered  . Influenza, Seasonal, Injecte, Preservative Fre 10/10/2011, 10/11/2012  . Influenza,inj,Quad PF,6+ Mos 11/04/2013, 08/05/2014, 08/17/2015, 09/06/2016, 08/07/2017  . Pneumococcal Polysaccharide-23 05/10/2012  . Tdap 05/10/2011    Qualifies for Shingles Vaccine? No  Screening Tests Health Maintenance  Topic Date Due  . COLONOSCOPY  05/23/2011  . HIV Screening  08/10/2029 (Originally 05/22/1976)  . INFLUENZA VACCINE  05/31/2018  . TETANUS/TDAP  05/09/2021  . Hepatitis C Screening  Completed   Cancer Screenings: Lung: Low Dose CT Chest recommended if Age 40-80 years, 30 pack-year currently smoking OR have quit w/in 15years. Patient does not qualify. Colorectal: Cologuard ordered today. Message sent to referral coordinator for completion of appropriate paperwork.  Additional Screenings: Hepatitis C Screening: Completed 06/06/16     Plan:  I have personally reviewed and addressed the Medicare Annual Wellness questionnaire and have noted the following in the patient's chart:  A. Medical and social history B. Use of alcohol, tobacco or illicit drugs  C. Current medications and supplements D. Functional ability and status E.  Nutritional status F.  Physical activity G. Advance directives H. List of other physicians I.  Hospitalizations, surgeries, and ER visits in previous 12 months J.  Vitals K. Screenings such as hearing and vision if needed, cognitive and depression L. Referrals and appointments  In addition, I have reviewed and discussed with patient certain  preventive protocols, quality metrics, and best practice recommendations. A written personalized care plan for preventive services as well as general preventive health recommendations were provided to patient.  See attached scanned questionnaire for additional information.   Signed,  Deon Pilling, LPN Nurse Health Advisor  I have reviewed this encounter including  the documentation in this note and/or discussed this patient with the provider, Deon Pilling, LPN. I am certifying that I agree with the content of this note as supervising physician.  Alba Cory, MD [facility] 03/09/2018, 5:16 PM

## 2018-03-09 NOTE — Addendum Note (Signed)
Addended by: Cynda Familia on: 03/09/2018 01:52 PM   Modules accepted: Orders

## 2018-03-09 NOTE — Patient Instructions (Addendum)
[name] , Thank you for taking time to come for your Medicare Wellness Visit. I appreciate your ongoing commitment to your health goals. Please review the following plan we discussed and let me know if I can assist you in the future.   Screening recommendations/referrals: Colorectal Screening: Cologuard ordered today Lung Cancer Screening: You do not qualify for this screening Hepatitis C Screening: Completed 06/06/16  Vision and Dental Exams: Recommended annual ophthalmology exams for early detection of glaucoma and other disorders of the eye Recommended annual dental exams for proper oral hygiene  Vaccinations: Influenza vaccine: Up to date Pneumococcal vaccine: Not yet required Tdap vaccine: Up to date Shingles vaccine: Not yet required    Advanced directives: Please bring a copy of your POA (Power of Robinson) and/or Living Will to your next appointment.  Conditions/risks identified: Recommend to drink at least 6-8 8oz glasses of water per day.  Next appointment: Please schedule your Annual Wellness Visit with your Nurse Health Advisor in one year.  Preventive Care 40-64 Years, Male Preventive care refers to lifestyle choices and visits with your health care provider that can promote health and wellness. What does preventive care include?  A yearly physical exam. This is also called an annual well check.  Dental exams once or twice a year.  Routine eye exams. Ask your health care provider how often you should have your eyes checked.  Personal lifestyle choices, including:  Daily care of your teeth and gums.  Regular physical activity.  Eating a healthy diet.  Avoiding tobacco and drug use.  Limiting alcohol use.  Practicing safe sex.  Taking low-dose aspirin every day starting at age 75. What happens during an annual well check? The services and screenings done by your health care provider during your annual well check will depend on your age, overall health,  lifestyle risk factors, and family history of disease. Counseling  Your health care provider may ask you questions about your:  Alcohol use.  Tobacco use.  Drug use.  Emotional well-being.  Home and relationship well-being.  Sexual activity.  Eating habits.  Work and work Astronomer. Screening  You may have the following tests or measurements:  Height, weight, and BMI.  Blood pressure.  Lipid and cholesterol levels. These may be checked every 5 years, or more frequently if you are over 86 years old.  Skin check.  Lung cancer screening. You may have this screening every year starting at age 72 if you have a 30-pack-year history of smoking and currently smoke or have quit within the past 15 years.  Fecal occult blood test (FOBT) of the stool. You may have this test every year starting at age 72.  Flexible sigmoidoscopy or colonoscopy. You may have a sigmoidoscopy every 5 years or a colonoscopy every 10 years starting at age 25.  Prostate cancer screening. Recommendations will vary depending on your family history and other risks.  Hepatitis C blood test.  Hepatitis B blood test.  Sexually transmitted disease (STD) testing.  Diabetes screening. This is done by checking your blood sugar (glucose) after you have not eaten for a while (fasting). You may have this done every 1-3 years. Discuss your test results, treatment options, and if necessary, the need for more tests with your health care provider. Vaccines  Your health care provider may recommend certain vaccines, such as:  Influenza vaccine. This is recommended every year.  Tetanus, diphtheria, and acellular pertussis (Tdap, Td) vaccine. You may need a Td booster every 10 years.  Zoster vaccine. You may need this after age 63.  Pneumococcal 13-valent conjugate (PCV13) vaccine. You may need this if you have certain conditions and have not been vaccinated.  Pneumococcal polysaccharide (PPSV23) vaccine. You may  need one or two doses if you smoke cigarettes or if you have certain conditions. Talk to your health care provider about which screenings and vaccines you need and how often you need them. This information is not intended to replace advice given to you by your health care provider. Make sure you discuss any questions you have with your health care provider. Document Released: 11/13/2015 Document Revised: 07/06/2016 Document Reviewed: 08/18/2015 Elsevier Interactive Patient Education  2017 [place] Prevention in the Home Falls can cause injuries. They can happen to people of all ages. There are many things you can do to make your home safe and to help prevent falls. What can I do on the outside of my home?  Regularly fix the edges of walkways and driveways and fix any cracks.  Remove anything that might make you trip as you walk through a door, such as a raised step or threshold.  Trim any bushes or trees on the path to your home.  Use bright outdoor lighting.  Clear any walking paths of anything that might make someone trip, such as rocks or tools.  Regularly check to see if handrails are loose or broken. Make sure that both sides of any steps have handrails.  Any raised decks and porches should have guardrails on the edges.  Have any leaves, snow, or ice cleared regularly.  Use sand or salt on walking paths during winter.  Clean up any spills in your garage right away. This includes oil or grease spills. What can I do in the bathroom?  Use night lights.  Install grab bars by the toilet and in the tub and shower. Do not use towel bars as grab bars.  Use non-skid mats or decals in the tub or shower.  If you need to sit down in the shower, use a plastic, non-slip stool.  Keep the floor dry. Clean up any water that spills on the floor as soon as it happens.  Remove soap buildup in the tub or shower regularly.  Attach bath mats securely with double-sided non-slip  rug tape.  Do not have throw rugs and other things on the floor that can make you trip. What can I do in the bedroom?  Use night lights.  Make sure that you have a light by your bed that is easy to reach.  Do not use any sheets or blankets that are too big for your bed. They should not hang down onto the floor.  Have a firm chair that has side arms. You can use this for support while you get dressed.  Do not have throw rugs and other things on the floor that can make you trip. What can I do in the kitchen?  Clean up any spills right away.  Avoid walking on wet floors.  Keep items that you use a lot in easy-to-reach places.  If you need to reach something above you, use a strong step stool that has a grab bar.  Keep electrical cords out of the way.  Do not use floor polish or wax that makes floors slippery. If you must use wax, use non-skid floor wax.  Do not have throw rugs and other things on the floor that can make you trip. What can I do with  my stairs?  Do not leave any items on the stairs.  Make sure that there are handrails on both sides of the stairs and use them. Fix handrails that are broken or loose. Make sure that handrails are as long as the stairways.  Check any carpeting to make sure that it is firmly attached to the stairs. Fix any carpet that is loose or worn.  Avoid having throw rugs at the top or bottom of the stairs. If you do have throw rugs, attach them to the floor with carpet tape.  Make sure that you have a light switch at the top of the stairs and the bottom of the stairs. If you do not have them, ask someone to add them for you. What else can I do to help prevent falls?  Wear shoes that:  Do not have high heels.  Have rubber bottoms.  Are comfortable and fit you well.  Are closed at the toe. Do not wear sandals.  If you use a stepladder:  Make sure that it is fully opened. Do not climb a closed stepladder.  Make sure that both sides of  the stepladder are locked into place.  Ask someone to hold it for you, if possible.  Clearly mark and make sure that you can see:  Any grab bars or handrails.  First and last steps.  Where the edge of each step is.  Use tools that help you move around (mobility aids) if they are needed. These include:  Canes.  Walkers.  Scooters.  Crutches.  Turn on the lights when you go into a dark area. Replace any light bulbs as soon as they burn out.  Set up your furniture so you have a clear path. Avoid moving your furniture around.  If any of your floors are uneven, fix them.  If there are any pets around you, be aware of where they are.  Review your medicines with your doctor. Some medicines can make you feel dizzy. This can increase your chance of falling. Ask your doctor what other things that you can do to help prevent falls. This information is not intended to replace advice given to you by your health care provider. Make sure you discuss any questions you have with your health care provider. Document Released: 08/13/2009 Document Revised: 03/24/2016 Document Reviewed: 11/21/2014 Elsevier Interactive Patient Education  2017 [employer].

## 2018-03-11 ENCOUNTER — Other Ambulatory Visit: Payer: Self-pay | Admitting: Family Medicine

## 2018-03-11 LAB — CBC WITH DIFFERENTIAL/PLATELET
Basophils Absolute: 38 cells/uL (ref 0–200)
Basophils Relative: 0.7 %
EOS ABS: 130 {cells}/uL (ref 15–500)
Eosinophils Relative: 2.4 %
HEMATOCRIT: 34 % — AB (ref 38.5–50.0)
HEMOGLOBIN: 11.6 g/dL — AB (ref 13.2–17.1)
LYMPHS ABS: 1993 {cells}/uL (ref 850–3900)
MCH: 29.2 pg (ref 27.0–33.0)
MCHC: 34.1 g/dL (ref 32.0–36.0)
MCV: 85.6 fL (ref 80.0–100.0)
MPV: 10.7 fL (ref 7.5–12.5)
Monocytes Relative: 13.6 %
NEUTROS ABS: 2506 {cells}/uL (ref 1500–7800)
Neutrophils Relative %: 46.4 %
Platelets: 213 10*3/uL (ref 140–400)
RBC: 3.97 10*6/uL — ABNORMAL LOW (ref 4.20–5.80)
RDW: 12.5 % (ref 11.0–15.0)
Total Lymphocyte: 36.9 %
WBC mixed population: 734 cells/uL (ref 200–950)
WBC: 5.4 10*3/uL (ref 3.8–10.8)

## 2018-03-11 LAB — IRON,TIBC AND FERRITIN PANEL
%SAT: 17 % (ref 15–60)
FERRITIN: 196 ng/mL (ref 20–380)
Iron: 46 ug/dL — ABNORMAL LOW (ref 50–180)
TIBC: 270 mcg/dL (calc) (ref 250–425)

## 2018-03-11 LAB — TSH: TSH: 3.56 mIU/L (ref 0.40–4.50)

## 2018-03-11 LAB — URINE CULTURE
MICRO NUMBER: 90575256
SPECIMEN QUALITY: ADEQUATE

## 2018-03-11 LAB — COMPLETE METABOLIC PANEL WITH GFR
AG RATIO: 1.3 (calc) (ref 1.0–2.5)
ALT: 10 U/L (ref 9–46)
AST: 21 U/L (ref 10–35)
Albumin: 3.9 g/dL (ref 3.6–5.1)
Alkaline phosphatase (APISO): 51 U/L (ref 40–115)
BILIRUBIN TOTAL: 0.3 mg/dL (ref 0.2–1.2)
BUN: 10 mg/dL (ref 7–25)
CO2: 30 mmol/L (ref 20–32)
Calcium: 9.5 mg/dL (ref 8.6–10.3)
Chloride: 101 mmol/L (ref 98–110)
Creat: 1.17 mg/dL (ref 0.70–1.33)
GFR, EST AFRICAN AMERICAN: 80 mL/min/{1.73_m2} (ref >=60)
GFR, EST NON AFRICAN AMERICAN: 69 mL/min/{1.73_m2} (ref >=60)
GLOBULIN: 3.1 g/dL (ref 1.9–3.7)
Glucose, Bld: 87 mg/dL (ref 65–139)
POTASSIUM: 3.8 mmol/L (ref 3.5–5.3)
SODIUM: 138 mmol/L (ref 135–146)
Total Protein: 7 g/dL (ref 6.1–8.1)

## 2018-03-11 LAB — VALPROIC ACID LEVEL: Valproic Acid Lvl: 53.6 mg/L (ref 50.0–100.0)

## 2018-03-11 MED ORDER — SULFAMETHOXAZOLE-TRIMETHOPRIM 800-160 MG PO TABS: 1.0000 | ORAL_TABLET | Freq: Two times a day (BID) | ORAL | 0 refills | Status: DC

## 2018-04-12 ENCOUNTER — Other Ambulatory Visit: Payer: Self-pay | Admitting: Family Medicine

## 2018-04-12 DIAGNOSIS — F419 Anxiety disorder, unspecified: Secondary | ICD-10-CM

## 2018-04-12 DIAGNOSIS — F840 Autistic disorder: Secondary | ICD-10-CM

## 2018-04-12 DIAGNOSIS — R451 Restlessness and agitation: Secondary | ICD-10-CM

## 2018-05-07 DIAGNOSIS — F919 Conduct disorder, unspecified: Secondary | ICD-10-CM | POA: Diagnosis not present

## 2018-05-07 DIAGNOSIS — F988 Other specified behavioral and emotional disorders with onset usually occurring in childhood and adolescence: Secondary | ICD-10-CM | POA: Diagnosis not present

## 2018-05-07 DIAGNOSIS — R000 Tachycardia, unspecified: Secondary | ICD-10-CM | POA: Diagnosis not present

## 2018-05-07 DIAGNOSIS — F69 Unspecified disorder of adult personality and behavior: Secondary | ICD-10-CM | POA: Diagnosis not present

## 2018-05-31 ENCOUNTER — Telehealth: Payer: Self-pay | Admitting: Family Medicine

## 2018-05-31 DIAGNOSIS — R569 Unspecified convulsions: Secondary | ICD-10-CM

## 2018-05-31 DIAGNOSIS — F419 Anxiety disorder, unspecified: Secondary | ICD-10-CM

## 2018-05-31 DIAGNOSIS — F840 Autistic disorder: Secondary | ICD-10-CM

## 2018-05-31 NOTE — Telephone Encounter (Signed)
[facility] needs clarification if the pt is suppose to be d/c'd from the Depakote? Please advise.

## 2018-06-01 MED ORDER — DIVALPROEX SODIUM 250 MG PO DR TAB: 250.0000 mg | DELAYED_RELEASE_TABLET | ORAL | 1 refills | Status: DC

## 2018-06-01 NOTE — Telephone Encounter (Signed)
He is supposed to see psychiatrist, but he is supposed to be on medication until that time

## 2018-06-01 NOTE — Telephone Encounter (Signed)
Followed up with caregiver and he has an appt on August 14 and called Pharmacy regarding medication dosage.

## 2018-06-01 NOTE — Addendum Note (Signed)
Addended by: Alba CorySOWLES, Liron Eissler F on: 06/01/2018 10:58 AM   Modules accepted: Orders

## 2018-06-11 ENCOUNTER — Ambulatory Visit: Payer: Self-pay | Admitting: Family Medicine

## 2018-06-13 DIAGNOSIS — F840 Autistic disorder: Secondary | ICD-10-CM | POA: Diagnosis not present

## 2018-06-13 DIAGNOSIS — F69 Unspecified disorder of adult personality and behavior: Secondary | ICD-10-CM | POA: Diagnosis not present

## 2018-06-15 DIAGNOSIS — R4182 Altered mental status, unspecified: Secondary | ICD-10-CM | POA: Diagnosis not present

## 2018-06-15 DIAGNOSIS — G479 Sleep disorder, unspecified: Secondary | ICD-10-CM | POA: Diagnosis not present

## 2018-06-15 DIAGNOSIS — R410 Disorientation, unspecified: Secondary | ICD-10-CM | POA: Diagnosis not present

## 2018-06-15 DIAGNOSIS — R404 Transient alteration of awareness: Secondary | ICD-10-CM | POA: Diagnosis not present

## 2018-06-15 DIAGNOSIS — F840 Autistic disorder: Secondary | ICD-10-CM | POA: Diagnosis not present

## 2018-06-15 DIAGNOSIS — R631 Polydipsia: Secondary | ICD-10-CM | POA: Diagnosis not present

## 2018-06-15 DIAGNOSIS — R451 Restlessness and agitation: Secondary | ICD-10-CM | POA: Diagnosis not present

## 2018-06-16 ENCOUNTER — Other Ambulatory Visit: Payer: Self-pay | Admitting: Family Medicine

## 2018-06-16 DIAGNOSIS — F840 Autistic disorder: Secondary | ICD-10-CM

## 2018-06-16 DIAGNOSIS — R451 Restlessness and agitation: Secondary | ICD-10-CM

## 2018-06-20 ENCOUNTER — Ambulatory Visit (INDEPENDENT_AMBULATORY_CARE_PROVIDER_SITE_OTHER): Payer: Medicare Other | Admitting: Family Medicine

## 2018-06-20 ENCOUNTER — Encounter: Payer: Self-pay | Admitting: Family Medicine

## 2018-06-20 VITALS — BP 122/70 | HR 70 | Temp 98.1°F | Resp 16 | Ht 69.0 in | Wt 173.2 lb

## 2018-06-20 DIAGNOSIS — R350 Frequency of micturition: Secondary | ICD-10-CM

## 2018-06-20 DIAGNOSIS — Z23 Encounter for immunization: Secondary | ICD-10-CM | POA: Diagnosis not present

## 2018-06-20 DIAGNOSIS — F840 Autistic disorder: Secondary | ICD-10-CM

## 2018-06-20 DIAGNOSIS — D649 Anemia, unspecified: Secondary | ICD-10-CM | POA: Diagnosis not present

## 2018-06-20 DIAGNOSIS — Z79899 Other long term (current) drug therapy: Secondary | ICD-10-CM

## 2018-06-20 NOTE — Progress Notes (Signed)
Name: Erik Henderson   MRN: [id number]    DOB: December 06, 1960   Date:06/20/2018       Progress Note  Subjective  Chief Complaint  Chief Complaint  Patient presents with  . weak bladder    patient has been drinking a lot of water and has been urinating a lot. sometimes he can barely make it to the bathroom  . Medication Management    patient sister would like for him to be put on Ativan    HPI   Urinary frequency: he drinks a lot of water, and has urinary frequency , but no urine odor, fever or chills. He used to take Detrol, but explained anticholinergics can cause dry mouth and mental fogginess, because of behavior changes she would like to hold off going back on medication  Autism: going through phases of confusion, shakes, disruptive behavior, lack of appetite, wondering .He goes through episodes once a week and lasting about 36 hours and resolves by itself , he  had to [facility] and is now seeing psychiatrist at [facility]. Dr. Langston Masker, and has follow up next week. He recently had labs at [facility], only abnormal slightly elevated TSH. We will recheck in 6 weeks. He also has anemia and we will recheck labs and add iron and CBC again He has a tooth that has to be extracted, pain and infection can be the cause of his symptoms.    Patient Active Problem List   Diagnosis Date Noted  . Hyperglycemia 08/17/2015  . Anxiety 04/14/2015  . Cognitive impairment 04/14/2015  . Autism 02/23/2015  . Hay fever 02/21/2008    Past Surgical History:  Procedure Laterality Date  . NO PAST SURGERIES      Family History  Problem Relation Age of Onset  . Hypertension Mother   . Hyperlipidemia Mother   . Diabetes Mother   . Cancer Mother        stomach  . Coronary artery disease Father   . Gout Father   . Heart failure Father   . Cancer Father        prostate  . Hypertension Brother   . Autism Brother   . Mental retardation Brother     Social History   Socioeconomic History  . Marital status: Single   Spouse name: Not on file  . Number of children: 0  . Years of education: Not on file  . Highest education level: Never attended school  Occupational History  . Occupation: disabled  Social Needs  . Financial resource strain: Somewhat hard  . Food insecurity:    Worry: Sometimes true    Inability: Sometimes true  . Transportation needs:    Medical: No    Non-medical: No  Tobacco Use  . Smoking status: Never Smoker  . Smokeless tobacco: Never Used  . Tobacco comment: smoking cessation materials not required  Substance and Sexual Activity  . Alcohol use: No    Alcohol/week: 0.0 standard drinks  . Drug use: No  . Sexual activity: Never  Lifestyle  . Physical activity:    Days per week: 0 days    Minutes per session: 0 min  . Stress: Not at all  Relationships  . Social connections:    Talks on phone: Patient refused    Gets together: Patient refused    Attends religious service: Patient refused    Active member of club or organization: Patient refused    Attends meetings of clubs or organizations: Patient refused    Relationship status:  Patient refused  . Intimate partner violence:    Fear of current or ex partner: No    Emotionally abused: No    Physically abused: No    Forced sexual activity: No  Other Topics Concern  . Not on file  Social History Narrative   Lives with mother, brothers and sisters.    On disability since birth            Current Outpatient Medications:  .  divalproex (DEPAKOTE) 250 MG DR tablet, Take 1 tablet (250 mg total) by mouth See admin instructions. TAKE 1 TABLET TWICE DAILY AND 2 TABLETS IN THE EVENING, Disp: 120 tablet, Rfl: 1 .  LORazepam (ATIVAN) 0.5 MG tablet, Take 1 tablet (0.5 mg total) by mouth daily as needed for anxiety., Disp: 30 tablet, Rfl: 0 .  risperiDONE (RISPERDAL) 2 MG tablet, TAKE 1 TABLET BY MOUTH IN THE MORNING., Disp: 90 tablet, Rfl: 0  No Known Allergies   ROS  Patient is not able to give me review of systems,  but sister states besides the behavior changes he is stable.   Objective  Vitals:   06/20/18 1524  BP: 122/70  Pulse: 70  Resp: 16  Temp: 98.1 F (36.7 C)  TempSrc: Oral  SpO2: 98%  Weight: 173 lb 3.2 oz (78.6 kg)  Height: 5\' 9"  (1.753 m)    Body mass index is 25.58 kg/m.  Physical Exam  Constitutional: Patient appears well-developed and well-nourished. Humming, rocking back and forth but calm. No distress.  HEENT: head atraumatic, normocephalic, pupils equal and reactive to light,  neck supple, throat within normal limits Cardiovascular: Normal rate, regular rhythm and normal heart sounds.  No murmur heard. No BLE edema. Pulmonary/Chest: Effort normal and breath sounds normal. No respiratory distress. Abdominal: Soft.  There is no tenderness. Psychiatric: Patient has a normal mood and affect. Does not talk.   PHQ2/9: Depression screen [facility] 2/9 06/20/2018 06/09/2017 06/06/2016 08/17/2015 04/14/2015  Decreased Interest 0 0 0 0 0  Down, Depressed, Hopeless 0 0 0 0 0  PHQ - 2 Score 0 0 0 0 0  Altered sleeping 3 - - - -  Tired, decreased energy 0 - - - -  Change in appetite 3 - - - -  Feeling bad or failure about yourself  0 - - - -  Trouble concentrating 3 - - - -  Moving slowly or fidgety/restless 3 - - - -  Suicidal thoughts 0 - - - -  PHQ-9 Score 12 - - - -   Information given by caregiver and under the care of psychiatrist now   Fall Risk: Fall Risk  03/09/2018 02/06/2018 12/07/2017 09/08/2017 06/09/2017  Falls in the past year? No No No No No  Risk for fall due to : Other (Comment) - - - -  Risk for fall due to: Comment autism; shuffling gait - - - -     Functional Status Survey: Is the patient deaf or have difficulty hearing?: No Does the patient have difficulty seeing, even when wearing glasses/contacts?: No Does the patient have difficulty concentrating, remembering, or making decisions?: Yes Does the patient have difficulty walking or climbing stairs?: No Does the  patient have difficulty dressing or bathing?: Yes Does the patient have difficulty doing errands alone such as visiting a doctor's office or shopping?: Yes   Assessment & Plan  1. Urinary frequency  Had clean cath done recently that was negative, but we will recheck it today  - Urine Culture -  Comp. Metabolic Panel (12)  2. Active autistic disorder  [facility], explained that psychiatrist is managing his medication and we cannot refill his medications from now on. He has follow up with them next week.   3. Anemia, unspecified type  - Iron, TIBC and Ferritin Panel; Future - CBC  He is unable to have colonoscopy at this time  4. Long-term use of high-risk medication  - Comp. Metabolic Panel (12)

## 2018-06-21 LAB — URINE CULTURE
MICRO NUMBER: 90997707
SPECIMEN QUALITY:: ADEQUATE

## 2018-06-21 LAB — CBC
HEMATOCRIT: 35.1 % — AB (ref 38.5–50.0)
HEMOGLOBIN: 11.6 g/dL — AB (ref 13.2–17.1)
MCH: 29.1 pg (ref 27.0–33.0)
MCHC: 33 g/dL (ref 32.0–36.0)
MCV: 88 fL (ref 80.0–100.0)
MPV: 10.8 fL (ref 7.5–12.5)
Platelets: 215 10*3/uL (ref 140–400)
RBC: 3.99 10*6/uL — AB (ref 4.20–5.80)
RDW: 12.4 % (ref 11.0–15.0)
WBC: 5 10*3/uL (ref 3.8–10.8)

## 2018-06-26 ENCOUNTER — Other Ambulatory Visit: Payer: Self-pay

## 2018-06-27 DIAGNOSIS — F79 Unspecified intellectual disabilities: Secondary | ICD-10-CM | POA: Diagnosis not present

## 2018-06-27 DIAGNOSIS — F840 Autistic disorder: Secondary | ICD-10-CM | POA: Diagnosis not present

## 2018-06-27 DIAGNOSIS — R4689 Other symptoms and signs involving appearance and behavior: Secondary | ICD-10-CM | POA: Insufficient documentation

## 2018-07-05 ENCOUNTER — Ambulatory Visit: Payer: Self-pay | Admitting: *Deleted

## 2018-07-05 ENCOUNTER — Ambulatory Visit (INDEPENDENT_AMBULATORY_CARE_PROVIDER_SITE_OTHER): Payer: Medicare Other | Admitting: Family Medicine

## 2018-07-05 ENCOUNTER — Encounter: Payer: Self-pay | Admitting: Family Medicine

## 2018-07-05 VITALS — BP 124/72 | HR 90 | Temp 98.0°F | Resp 16 | Ht 69.0 in | Wt 164.9 lb

## 2018-07-05 DIAGNOSIS — F419 Anxiety disorder, unspecified: Secondary | ICD-10-CM | POA: Diagnosis not present

## 2018-07-05 DIAGNOSIS — R569 Unspecified convulsions: Secondary | ICD-10-CM

## 2018-07-05 DIAGNOSIS — R739 Hyperglycemia, unspecified: Secondary | ICD-10-CM

## 2018-07-05 DIAGNOSIS — R4189 Other symptoms and signs involving cognitive functions and awareness: Secondary | ICD-10-CM

## 2018-07-05 DIAGNOSIS — F840 Autistic disorder: Secondary | ICD-10-CM

## 2018-07-05 DIAGNOSIS — E782 Mixed hyperlipidemia: Secondary | ICD-10-CM | POA: Diagnosis not present

## 2018-07-05 DIAGNOSIS — D649 Anemia, unspecified: Secondary | ICD-10-CM | POA: Diagnosis not present

## 2018-07-05 NOTE — Addendum Note (Signed)
Addended by: Alba Cory F on: 07/05/2018 12:26 PM   Modules accepted: Orders

## 2018-07-05 NOTE — Progress Notes (Signed)
  Chronic Care Management   Note  07/05/2018 Name: Marquee Wavra MRN: [id number] DOB: Mar 12, 1961  New referral received from Mr. Robert's PCP, Dr. Alba Cory.   Plan: The CCM team will outreach to Mr. Su Hilt and his caregiver to introduce the CCM Program.    Marja Kays [facility] Care Management  [phone]

## 2018-07-05 NOTE — Progress Notes (Addendum)
Name: Erik Henderson   MRN: [id number]    DOB: 01/12/1961   Date:07/05/2018       Progress Note  Subjective  Chief Complaint  Chief Complaint  Patient presents with  . Fussy  . Seizures  . Insulin Resistance  . Dyslipidemia  . Urinary Frequency    patient's sister stated that he has been going to the bathroom a lot; sometimes he barely makes it.    HPI  Seizure disorder: caregivers states he has been seizure free for many years, but they are afraid of stopping medication. Dose has been adjusted and last valproic acid level wasat goal.No side effects of medication.  Insulin Resistance: mother has diabetes, he is on diabetic diet, his weight has been stable, no change in appetite,no polyuria, but likes to drink a lot of water. They make sure he does not over drink. .Last insulin level was elevated..last hgbA1C 5.5% we will check it yearly    Dyslipidemia: on life style modification, last level was at goal . We will recheck labs yearly  Autism spectrum disorder: recent change in behavior intermittently, he is now under the care of psychiatrist, back on lorazepam 0.5 mg BID and on higher dose of Risperdal at 2 mg in am and 4 mg at night. Sister states not going through zoning out episodes as often and does not last as long. The starring , looking into space and agitation has improved.   Anemia: he is not a good candidate for colon cancer screen, family states it will be difficulty to have colonoscopy, but states they can try hemoccult cards yearly    Patient Active Problem List   Diagnosis Date Noted  . Behavioral change 06/27/2018  . Intellectual disability 06/27/2018  . Hyperglycemia 08/17/2015  . Anxiety 04/14/2015  . Cognitive impairment 04/14/2015  . Autism spectrum disorder 02/23/2015  . Hay fever 02/21/2008    Past Surgical History:  Procedure Laterality Date  . NO PAST SURGERIES      Family History  Problem Relation Age of Onset  . Hypertension Mother   .  Hyperlipidemia Mother   . Diabetes Mother   . Cancer Mother        stomach  . Coronary artery disease Father   . Gout Father   . Heart failure Father   . Cancer Father        prostate  . Hypertension Brother   . Autism Brother   . Mental retardation Brother     Social History   Socioeconomic History  . Marital status: Single    Spouse name: Not on file  . Number of children: 0  . Years of education: Not on file  . Highest education level: Never attended school  Occupational History  . Occupation: disabled  Social Needs  . Financial resource strain: Somewhat hard  . Food insecurity:    Worry: Sometimes true    Inability: Sometimes true  . Transportation needs:    Medical: No    Non-medical: No  Tobacco Use  . Smoking status: Never Smoker  . Smokeless tobacco: Never Used  . Tobacco comment: smoking cessation materials not required  Substance and Sexual Activity  . Alcohol use: No    Alcohol/week: 0.0 standard drinks  . Drug use: No  . Sexual activity: Never  Lifestyle  . Physical activity:    Days per week: 0 days    Minutes per session: 0 min  . Stress: Not at all  Relationships  . Social connections:  Talks on phone: Never    Gets together: Never    Attends religious service: Never    Active member of club or organization: No    Attends meetings of clubs or organizations: Never    Relationship status: Never married  . Intimate partner violence:    Fear of current or ex partner: No    Emotionally abused: No    Physically abused: No    Forced sexual activity: No  Other Topics Concern  . Not on file  Social History Narrative   Lives with mother, brothers and sisters.    On disability since birth                  Current Outpatient Medications:  .  divalproex (DEPAKOTE) 250 MG DR tablet, Take 1 tablet (250 mg total) by mouth See admin instructions. TAKE 1 TABLET TWICE DAILY AND 2 TABLETS IN THE EVENING, Disp: 120 tablet, Rfl: 1 .  LORazepam  (ATIVAN) 0.5 MG tablet, Take 1 tablet (0.5 mg total) by mouth daily as needed for anxiety., Disp: 30 tablet, Rfl: 0 .  risperiDONE (RISPERDAL) 2 MG tablet, TAKE 1 TABLET BY MOUTH IN THE MORNING., Disp: 90 tablet, Rfl: 0  No Known Allergies   ROS  Sister states no problems unless zoning out.   Objective  Vitals:   07/05/18 1132  BP: 124/72  Pulse: 90  Resp: 16  Temp: 98 F (36.7 C)  TempSrc: Oral  SpO2: 99%  Weight: 164 lb 14.4 oz (74.8 kg)  Height: 5\' 9"  (1.753 m)    Body mass index is 24.35 kg/m.  Physical Exam  Constitutional: Patient appears well-developed and well-nourished.No distress.  HEENT: head atraumatic, normocephalic, pupils equal and reactive to light, neck supple, throat within normal limits Cardiovascular: Normal rate, regular rhythm and normal heart sounds.  No murmur heard. No BLE edema. Pulmonary/Chest: Effort normal and breath sounds normal. No respiratory distress. Abdominal: Soft.  There is no tenderness. Psychiatric:rocking , half smile, humming, seems happy and is cooperative today  Recent Results (from the past 2160 hour(s))  CBC     Status: Abnormal   Collection Time: 06/20/18  3:54 PM  Result Value Ref Range   WBC 5.0 3.8 - 10.8 Thousand/uL   RBC 3.99 (L) 4.20 - 5.80 Million/uL   Hemoglobin 11.6 (L) 13.2 - 17.1 g/dL   HCT 10.2 (L) 58.5 - 27.7 %   MCV 88.0 80.0 - 100.0 fL   MCH 29.1 27.0 - 33.0 pg   MCHC 33.0 32.0 - 36.0 g/dL   RDW 82.4 23.5 - 36.1 %   Platelets 215 140 - 400 Thousand/uL   MPV 10.8 7.5 - 12.5 fL  Urine Culture     Status: None   Collection Time: 06/20/18  3:54 PM  Result Value Ref Range   MICRO NUMBER: [id number]    SPECIMEN QUALITY: ADEQUATE    Sample Source URINE    STATUS: FINAL    Result:      Three or more organisms present, each greater than 10,000 cu/mL. May represent normal flora contamination from external genitalia. No further testing is required.     PHQ2/9: Depression screen [facility] 2/9 07/05/2018 06/20/2018  06/09/2017 06/06/2016 08/17/2015  Decreased Interest 0 0 0 0 0  Down, Depressed, Hopeless 0 0 0 0 0  PHQ - 2 Score 0 0 0 0 0  Altered sleeping 3 3 - - -  Tired, decreased energy 0 0 - - -  Change in appetite 3  3 - - -  Feeling bad or failure about yourself  0 0 - - -  Trouble concentrating 3 3 - - -  Moving slowly or fidgety/restless 3 3 - - -  Suicidal thoughts 0 0 - - -  PHQ-9 Score 12 12 - - -   Given by caregiver, not reliable   Fall Risk: Fall Risk  07/05/2018 03/09/2018 02/06/2018 12/07/2017 09/08/2017  Falls in the past year? No No No No No  Risk for fall due to : - Other (Comment) - - -  Risk for fall due to: Comment - autism; shuffling gait - - -    Functional Status Survey: Is the patient deaf or have difficulty hearing?: No Does the patient have difficulty seeing, even when wearing glasses/contacts?: No Does the patient have difficulty concentrating, remembering, or making decisions?: Yes Does the patient have difficulty walking or climbing stairs?: No Does the patient have difficulty dressing or bathing?: Yes Does the patient have difficulty doing errands alone such as visiting a doctor's office or shopping?: Yes    Assessment & Plan  1. Active autistic disorder  Under the care of psychiatrist now Referral chronic care management   2. Anemia, unspecified type  With mild drop in iron, advised hemoccult cards   3. Anxiety  Under the care of psychiatrist   4. Seizure [facility])  He is on depakote   5. Cognitive impairment  stable  6. Hyperglycemia  Has chronic polydipsia but hgbA1C is normal   7. Mixed hyperlipidemia  On life style modification

## 2018-07-10 ENCOUNTER — Ambulatory Visit: Payer: Self-pay | Admitting: *Deleted

## 2018-07-10 ENCOUNTER — Telehealth: Payer: Self-pay

## 2018-07-10 DIAGNOSIS — E782 Mixed hyperlipidemia: Secondary | ICD-10-CM

## 2018-07-10 DIAGNOSIS — F840 Autistic disorder: Secondary | ICD-10-CM

## 2018-07-10 DIAGNOSIS — D649 Anemia, unspecified: Secondary | ICD-10-CM

## 2018-07-10 DIAGNOSIS — R569 Unspecified convulsions: Secondary | ICD-10-CM

## 2018-07-10 NOTE — Patient Instructions (Signed)
Someone from our CCM (Chronic Care Management) Team will call you.   Alisa Gilboy MHA,BSN,RN,CCM Nurse Care Coordinator  [phone]   Julie Hedrick PharmD  Clinical Pharmacist  [phone]  

## 2018-07-10 NOTE — Chronic Care Management (AMB) (Signed)
  Chronic Care Management   Consult Note  07/10/2018 Name: Erik Henderson MRN: [id number] DOB: 1961/06/16   I had a face to face conversation today with [name]'s sister and brother who are his primary caregivers. [name] is nonverbal and has cognitive deficits.   [name] brother and sister were given information about Chronic Care Management services today including:  1. CCM service includes personalized support from designated clinical staff supervised by his physician, including individualized plan of care and coordination with other care providers 2. 24/7 contact phone numbers for assistance for urgent and routine care needs. 3. Service will only be billed when office clinical staff spend 20 minutes or more in a month to coordinate care. 4. Only one practitioner may furnish and bill the service in a calendar month. 5. The patient may stop CCM services at any time (effective at the end of the month) by phone call to the office staff. 6. The patient will be responsible for cost sharing (co-pay) of up to 20% of the service fee (after annual deductible is met).  Patient's caregivers agreed to services and verbal consent obtained.  Plan: I provided [name]' brother and sister with the CCM program contact information and available dates for a face to face office visit with the CCM team. [name]' siblings are to return a call to [place] to schedule a visit.   [facility] / [facility]  [phone]

## 2018-07-23 ENCOUNTER — Other Ambulatory Visit: Payer: Self-pay | Admitting: Family Medicine

## 2018-07-23 DIAGNOSIS — F840 Autistic disorder: Secondary | ICD-10-CM

## 2018-07-23 DIAGNOSIS — F419 Anxiety disorder, unspecified: Secondary | ICD-10-CM

## 2018-07-23 NOTE — Telephone Encounter (Signed)
[facility]   Last office visit 07/05/2018   Follow up on 01/02/2019

## 2018-07-24 ENCOUNTER — Ambulatory Visit: Payer: Self-pay | Admitting: *Deleted

## 2018-07-24 DIAGNOSIS — F419 Anxiety disorder, unspecified: Secondary | ICD-10-CM

## 2018-07-24 DIAGNOSIS — F840 Autistic disorder: Secondary | ICD-10-CM

## 2018-07-25 ENCOUNTER — Encounter: Payer: Self-pay | Admitting: *Deleted

## 2018-07-25 NOTE — Patient Instructions (Signed)
Call Case Manager at [facility] with questions/needs:   Marja Kays MHA,BSN,RN,CCM Nurse Care Coordinator [facility] Care Management  [phone]

## 2018-07-25 NOTE — Chronic Care Management (AMB) (Signed)
   Care Management   Visit Note  07/24/2018 Name: Erik Henderson MRN: [id number] DOB: Jan 11, 1961  Referred by: Alba Cory, MD Reason for referral : Care Coordination (new referral)   Subjective: "We need all the help we can get with Erik Henderson"  Assessment: [name] is a 57 year old gentleman who is a primary care patient of Dr. Alba Cory. [name] has a medical history which includes Autism Spectrum Disorder with Behavioral Disturbance and cognitive impairment. Of late, [name] has exhibited compulsions and anxiety. He was referred to psychiatry and neurology by Dr. Carlynn Purl.   I spoke briefly with [name] sister/caregiver when she was last in the office with her brothers and explained case management services to her, providing my contact information. I reached out to [name] today by phone and we discussed [name] current status and needs as outlined below.   Goals    . "We need all the help we can get with him. He has behavior problems right now." (pt-stated)     Family caregiver reports worsening obsession with water consumption, decreased food intake and related weight loss and anemia. Caregiver reports that she has spoken with psychiatrist who advises that patient's Lorazepam "might be" increased when he is seen in the office on 10/16. She is to call the neurology office tomorrow to reschedule a missed new patient appointment.   Clinical Goals: Over the next 7 days, patient's sister/caregiver will report receipt of neuropsychology appointment.  Interventions: Provided direction re: reaching out to neurology provider to reschedule appointment; offered support if difficulty encountered with scheduling appointment with specialty provider office; updated primary care provider     . "We need to sort out guardianship and power of attorney" (pt-stated)     Patient's family/caregiver indicates that mother holds guardianship/POA currently but wishes to transfer  guardianship/POA to daughter who is primary caregiver.   Clinical Goals: Over the next 30 days, patient's family/caregivers will complete required documentation for changes in guardianship/POA.   Interventions: Provided sister with information r/t changes in guardianship/POA. Offered to assist with obtaining needed documentation.      Marland Kitchen DIET - INCREASE WATER INTAKE     Recommend to drink at least 6-8 8oz glasses of water per day.      Plan: I will follow up with Ms. Ruperto by phone in one week.

## 2018-08-15 DIAGNOSIS — F79 Unspecified intellectual disabilities: Secondary | ICD-10-CM | POA: Diagnosis not present

## 2018-08-15 DIAGNOSIS — F840 Autistic disorder: Secondary | ICD-10-CM | POA: Diagnosis not present

## 2018-09-11 ENCOUNTER — Other Ambulatory Visit: Payer: Self-pay | Admitting: Family Medicine

## 2018-09-11 DIAGNOSIS — Z1211 Encounter for screening for malignant neoplasm of colon: Secondary | ICD-10-CM

## 2018-09-14 DIAGNOSIS — R4689 Other symptoms and signs involving appearance and behavior: Secondary | ICD-10-CM | POA: Diagnosis not present

## 2018-09-14 DIAGNOSIS — F849 Pervasive developmental disorder, unspecified: Secondary | ICD-10-CM | POA: Diagnosis not present

## 2018-09-14 DIAGNOSIS — F840 Autistic disorder: Secondary | ICD-10-CM | POA: Diagnosis not present

## 2018-09-14 DIAGNOSIS — R634 Abnormal weight loss: Secondary | ICD-10-CM | POA: Diagnosis not present

## 2018-09-14 DIAGNOSIS — G40409 Other generalized epilepsy and epileptic syndromes, not intractable, without status epilepticus: Secondary | ICD-10-CM | POA: Diagnosis not present

## 2018-09-14 DIAGNOSIS — F79 Unspecified intellectual disabilities: Secondary | ICD-10-CM | POA: Diagnosis not present

## 2018-09-14 DIAGNOSIS — R69 Illness, unspecified: Secondary | ICD-10-CM | POA: Diagnosis not present

## 2018-09-22 DIAGNOSIS — F69 Unspecified disorder of adult personality and behavior: Secondary | ICD-10-CM | POA: Insufficient documentation

## 2018-12-06 ENCOUNTER — Telehealth: Payer: Self-pay | Admitting: Family Medicine

## 2018-12-06 NOTE — Telephone Encounter (Signed)
Copied from CRM [id number]. Topic: Quick Communication - Rx Refill/Question >> Dec 06, 2018 11:53 AM Maia Petties wrote: Medication: DEPAKOTE 250 MG DR tablet - pt is out of medication - pt has refills available but the pharmacy stated insurance is denying the name brand - pt was on generic before and it didn't work effectively for him - she is asking that the doctor get the brand name approved Medicare/Medicaid  Has the patient contacted their pharmacy? yes Preferred Pharmacy (with phone number or street name): [facility] [place], [place]. - [place], [place] - [address] [phone] (Phone) [phone] (Fax)

## 2018-12-07 ENCOUNTER — Telehealth: Payer: Self-pay | Admitting: Family Medicine

## 2018-12-07 NOTE — Telephone Encounter (Signed)
Copied from CRM [id number]. Topic: Quick Communication - Rx Refill/Question >> Dec 07, 2018 10:02 AM Wyonia Hough E wrote: Medication: DEPAKOTE 250 MG DR tablet - Needs a PA  -Pt only has enough medication to last him for today given to him by the pharmacy yesterday   Has the patient contacted their pharmacy?  Yes - insurance will not cover, needs a PA    Preferred Pharmacy (with phone number or street name): [facility], [place]. - [place], [place] - [address] [phone] (Phone) [phone] (Fax)    Agent: Please be advised that RX refills may take up to 3 business days. We ask that you follow-up with your pharmacy.

## 2018-12-07 NOTE — Telephone Encounter (Signed)
Sister- odinn bevier called again. I advised the PA was being worked on and please call her when it is done.

## 2018-12-07 NOTE — Telephone Encounter (Signed)
Spoke with Mr. Olkowski sister and notified her that his Depakote PA went thru successfully. Pharmacy was also notified.  [name] Key: Glory Rosebush - PA Case ID: [id number]

## 2018-12-07 NOTE — Telephone Encounter (Signed)
Patient sister called back to check on status of the Prior authorization asking for a call back as soon as there is an answer if approved or denied by insurance. Please advise

## 2018-12-07 NOTE — Telephone Encounter (Signed)
PA has been initiated through member [employer] BIN: [id number] RxGroup: [id number] and PCN: [id number]. Awaiting a answer back

## 2018-12-10 NOTE — Telephone Encounter (Signed)
Spoke with [employer] and it went thru fine on their end. Patient caregivers picked it up on Friday. PA was done Friday 12/07/18 and patient family was notified that day.

## 2019-01-02 ENCOUNTER — Ambulatory Visit: Payer: Self-pay | Admitting: Family Medicine

## 2019-01-09 DIAGNOSIS — Z79899 Other long term (current) drug therapy: Secondary | ICD-10-CM | POA: Diagnosis not present

## 2019-01-09 DIAGNOSIS — F840 Autistic disorder: Secondary | ICD-10-CM | POA: Diagnosis not present

## 2019-01-09 DIAGNOSIS — F79 Unspecified intellectual disabilities: Secondary | ICD-10-CM | POA: Diagnosis not present

## 2019-01-09 DIAGNOSIS — F69 Unspecified disorder of adult personality and behavior: Secondary | ICD-10-CM | POA: Diagnosis not present

## 2019-02-21 ENCOUNTER — Encounter: Payer: Self-pay | Admitting: Family Medicine

## 2019-03-14 ENCOUNTER — Ambulatory Visit (INDEPENDENT_AMBULATORY_CARE_PROVIDER_SITE_OTHER): Payer: Medicare Other

## 2019-03-14 ENCOUNTER — Other Ambulatory Visit: Payer: Self-pay

## 2019-03-14 VITALS — BP 132/88 | HR 95 | Resp 16 | Ht 69.0 in | Wt 166.5 lb

## 2019-03-14 DIAGNOSIS — Z1211 Encounter for screening for malignant neoplasm of colon: Secondary | ICD-10-CM | POA: Diagnosis not present

## 2019-03-14 DIAGNOSIS — Z0000 Encounter for general adult medical examination without abnormal findings: Secondary | ICD-10-CM | POA: Diagnosis not present

## 2019-03-14 NOTE — Progress Notes (Addendum)
Subjective:   Erik Henderson is a 58 y.o. male who presents for Medicare Annual/Subsequent preventive examination.  Review of Systems:   Cardiac Risk Factors include: advanced age (>27mn, >>32women);male gender     Objective:    Vitals: BP 132/88 (BP Location: Right Arm, Patient Position: Sitting, Cuff Size: Normal)   Pulse 95   Resp 16   Ht '5\' 9"'  (1.753 m)   Wt 166 lb 8 oz (75.5 kg)   SpO2 98%   BMI 24.59 kg/m   Body mass index is 24.59 kg/m.  Advanced Directives 03/14/2019 03/09/2018 08/07/2017 11/15/2016 09/06/2016 06/06/2016 08/17/2015  Does Patient Have a Medical Advance Directive? Yes Yes No Yes Yes Yes No  Type of Advance Directive Healthcare Power of A[place]-  Does patient want to make changes to medical advance directive? - - - - No - Patient declined No - Patient declined -  Copy of [place] Chart? No - copy requested No - copy requested - - No - copy requested Yes -    Tobacco Social History   Tobacco Use  Smoking Status Never Smoker  Smokeless Tobacco Never Used  Tobacco Comment   smoking cessation materials not required     Counseling given: Not Answered Comment: smoking cessation materials not required   Clinical Intake:  Pre-visit preparation completed: Yes  Pain : No/denies pain     BMI - recorded: 24.59 Nutritional Status: BMI of 19-24  Normal Nutritional Risks: None Diabetes: No  How often do you need to have someone help you when you read instructions, pamphlets, or other written materials from your doctor or pharmacy?: 5 - Always  Interpreter Needed?: No  Information entered by :: KClemetine MarkerLPN  Past Medical History:  Diagnosis Date  . Allergy   . Anxiety   . Autism   . Autistic disorder   . Mental disorder   . Mental retardation    Past Surgical History:  Procedure Laterality  Date  . NO PAST SURGERIES     Family History  Problem Relation Age of Onset  . Hypertension Mother   . Hyperlipidemia Mother   . Diabetes Mother   . Cancer Mother        stomach  . Coronary artery disease Father   . Gout Father   . Heart failure Father   . Cancer Father        prostate  . Hypertension Brother   . Autism Brother   . Mental retardation Brother    Social History   Socioeconomic History  . Marital status: Single    Spouse name: Not on file  . Number of children: 0  . Years of education: Not on file  . Highest education level: Never attended school  Occupational History  . Occupation: disabled  Social Needs  . Financial resource strain: Not very hard  . Food insecurity:    Worry: Never true    Inability: Never true  . Transportation needs:    Medical: No    Non-medical: No  Tobacco Use  . Smoking status: Never Smoker  . Smokeless tobacco: Never Used  . Tobacco comment: smoking cessation materials not required  Substance and Sexual Activity  . Alcohol use: No    Alcohol/week: 0.0 standard drinks  . Drug use: No  . Sexual activity: Never  Lifestyle  . Physical activity:  Days per week: 0 days    Minutes per session: 0 min  . Stress: Not at all  Relationships  . Social connections:    Talks on phone: Never    Gets together: Never    Attends religious service: Never    Active member of club or organization: No    Attends meetings of clubs or organizations: Never    Relationship status: Never married  Other Topics Concern  . Not on file  Social History Narrative   Lives with mother, brothers and sisters; sisters are caregivers; mother is guardian/POA -- family discussing transfer of guardianship/POA to sister as parents in poor health   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                    Outpatient Encounter Medications as of 03/14/2019  Medication Sig  . DEPAKOTE 250 MG DR tablet TAKE ONE TABLET BY  MOUTH DAILY AT BREAKFAST THEN TAKE ONE TABLET AT LUNCH AND TAKE TWO TABLETS BY MOUTH AT BEDTIME  . LORazepam (ATIVAN) 1 MG tablet Take one tablet by mouth twice daily  . Melatonin 300 MCG TABS Take by mouth. Take 1-2 tablets by mouth at bedtime  . meloxicam (MOBIC) 15 MG tablet meloxicam 15 mg tablet  TAKE (1) TABLET BY MOUTH DAILY WITH FOOD  . risperiDONE (RISPERDAL) 2 MG tablet TAKE 1 TABLET BY MOUTH IN THE MORNING. (Patient taking differently: Take 2 mg by mouth 2 (two) times daily. 2 mg in the morning and 4 mg in the pm Prescribed by Trudi Ida)  . [DISCONTINUED] LORazepam (ATIVAN) 0.5 MG tablet Take 1 tablet (0.5 mg total) by mouth daily as needed for anxiety.   No facility-administered encounter medications on file as of 03/14/2019.     Activities of Daily Living In your present state of health, do you have any difficulty performing the following activities: 03/14/2019 07/05/2018  Hearing? N N  Comment declines hearing aids -  Vision? N N  Difficulty concentrating or making decisions? Y Y  Comment autistic -  Walking or climbing stairs? N N  Dressing or bathing? Y Y  Doing errands, shopping? Tempie Donning  Preparing Food and eating ? Y -  Using the Toilet? N -  In the past six months, have you accidently leaked urine? N -  Do you have problems with loss of bowel control? N -  Managing your Medications? Y -  Managing your Finances? Y -  Housekeeping or managing your Housekeeping? Y -  Some recent data might be hidden    Patient Care Team: Steele Sizer, MD as PCP - General (Family Medicine) Rae Lips, MD as Consulting Physician (Psychiatry) Clerance Lav, RN as Case Manager Clerance Lav, RN as Case Manager Cathi Roan, [facility] as Pharmacist (Pharmacist)   Assessment:   This is a routine wellness examination for Erik Henderson.  Exercise Activities and Dietary recommendations Current Exercise Habits: The patient does not participate in regular exercise at present,  Exercise limited by: psychological condition(s)  Goals    . "We need all the help we can get with him. He has behavior problems right now." (pt-stated)     Family caregiver reports worsening obsession with water consumption, decreased food intake and related weight loss and anemia. Caregiver reports that she has spoken with psychiatrist who advises that patient's Lorazepam "might be" increased when he is seen in the office on 10/16. She is to call the neurology  office tomorrow to reschedule a missed new patient appointment.   Clinical Goals: Over the next 7 days, patient's sister/caregiver will report receipt of neuropsychology appointment.  Interventions: Provided direction re: reaching out to neurology provider to reschedule appointment; offered support if difficulty encountered with scheduling appointment with specialty provider office; updated primary care provider     . "We need to sort out guardianship and power of attorney" (pt-stated)     Patient's family/caregiver indicates that mother holds guardianship/POA currently but wishes to transfer guardianship/POA to daughter who is primary caregiver.   Clinical Goals: Over the next 30 days, patient's family/caregivers will complete required documentation for changes in guardianship/POA.   Interventions: Provided sister with information r/t changes in guardianship/POA. Offered to assist with obtaining needed documentation.      Marland Kitchen DIET - INCREASE WATER INTAKE     Recommend to drink at least 6-8 8oz glasses of water per day.       Fall Risk Fall Risk  03/14/2019 07/05/2018 03/09/2018 02/06/2018 12/07/2017  Falls in the past year? 0 No No No No  Number falls in past yr: 0 - - - -  Injury with Fall? 0 - - - -  Risk for fall due to : - - Other (Comment) - -  Risk for fall due to: Comment - - autism; shuffling gait - -  Follow up Falls prevention discussed - - - -   FALL RISK PREVENTION PERTAINING TO THE HOME:  Any stairs in or around the  home? Yes  If so, do they handrails? Yes   Home free of loose throw rugs in walkways, pet beds, electrical cords, etc? Yes  Adequate lighting in your home to reduce risk of falls? Yes   ASSISTIVE DEVICES UTILIZED TO PREVENT FALLS:  Life alert? No  Use of a cane, walker or w/c? No  Grab bars in the bathroom? Yes  Shower chair or bench in shower? No  Elevated toilet seat or a handicapped toilet? No   DME ORDERS:  DME order needed?  No   TIMED UP AND GO:  Was the test performed? Yes .  Length of time to ambulate 10 feet: 5 sec.   GAIT:  Appearance of gait: Gait stead-fast and without the use of an assistive device.   Education: Fall risk prevention has been discussed.  Intervention(s) required? Yes   Depression Screen not assessed due to autism 03/14/19   PHQ 2/9 Scores 07/24/2018 07/05/2018 06/20/2018 03/09/2018  PHQ - 2 Score 0 0 0 -  PHQ- 9 Score '12 12 12 ' -  Exception Documentation Other- indicate reason in comment box - - Other- indicate reason in comment box  Not completed - - - Autism    Cognitive Function - unable to assess        Immunization History  Administered Date(s) Administered  . Influenza, Seasonal, Injecte, Preservative Fre 10/10/2011, 10/11/2012  . Influenza,inj,Quad PF,6+ Mos 11/04/2013, 08/05/2014, 08/17/2015, 09/06/2016, 08/07/2017, 06/20/2018  . Pneumococcal Polysaccharide-23 05/10/2012  . Tdap 05/10/2011    Qualifies for Shingles Vaccine? Yes  . Due for Shingrix. Education has been provided regarding the importance of this vaccine. Pt has been advised to call insurance company to determine out of pocket expense. Advised may also receive vaccine at local pharmacy or Health Dept. Verbalized acceptance and understanding.  Tdap: Up to date  Flu Vaccine: Up to date  Pneumococcal Vaccine: Up to date   Screening Tests Health Maintenance  Topic Date Due  . COLONOSCOPY  05/23/2011  .  HIV Screening  08/10/2029 (Originally 05/22/1976)  . INFLUENZA  VACCINE  06/01/2019  . TETANUS/TDAP  05/09/2021  . Hepatitis C Screening  Completed   Cancer Screenings:  Colorectal Screening: Due Cologuard ordered 09/11/18, pt's sister states they did not receive a kit. Reordered today.   Lung Cancer Screening: (Low Dose CT Chest recommended if Age 54-80 years, 30 pack-year currently smoking OR have quit w/in 15years.) does not qualify.   Additional Screening:  Hepatitis C Screening: does qualify; Completed 06/06/16  Vision Screening: Recommended annual ophthalmology exams for early detection of glaucoma and other disorders of the eye. Is the patient up to date with their annual eye exam?  No  Who is the provider or what is the name of the office in which the pt attends annual eye exams? Not establshed If pt is not established with a provider, would they like to be referred to a provider to establish care? No .   Dental Screening: Recommended annual dental exams for proper oral hygiene  Community Resource Referral:  CRR required this visit?  No       Plan:  I have personally reviewed and addressed the Medicare Annual Wellness questionnaire and have noted the following in the patient's chart:  A. Medical and social history B. Use of alcohol, tobacco or illicit drugs  C. Current medications and supplements D. Functional ability and status E.  Nutritional status F.  Physical activity G. Advance directives H. List of other physicians I.  Hospitalizations, surgeries, and ER visits in previous 12 months J.  Numa such as hearing and vision if needed, cognitive and depression L. Referrals and appointments   In addition, I have reviewed and discussed with patient certain preventive protocols, quality metrics, and best practice recommendations. A written personalized care plan for preventive services as well as general preventive health recommendations were provided to patient.   Signed,  Clemetine Marker, LPN Nurse Health Advisor    Nurse Notes:pt accompanied to visit today by his brother Rolan Bucco and sister Quita Skye who states pt may have an issue with his a tooth on the upper right side of his mouth and they plan to get him in to see a dentist now that dental offices are open again. They have tried to take him in the past but had difficulty due to sorting out power of attorney issues. She also stated that he has been more restless lately and due for appt with psych at [facility] but are behind on appts due to Covid-19. Pt was slightly agitated during appt today and his sister attributed his blood pressure to being more restless than usual.

## 2019-03-14 NOTE — Patient Instructions (Signed)
Erik Henderson , Thank you for taking time to come for your Medicare Wellness Visit. I appreciate your ongoing commitment to your health goals. Please review the following plan we discussed and let me know if I can assist you in the future.   Screening recommendations/referrals: Colonoscopy: due - interested in Cologuard  Recommended yearly ophthalmology/optometry visit for glaucoma screening and checkup Recommended yearly dental visit for hygiene and checkup  Vaccinations: Influenza vaccine: done 06/20/18 Pneumococcal vaccine: done 05/10/12 Tdap vaccine: done 05/10/11 Shingles vaccine: Shingrix discussed. Please contact your pharmacy for coverage information.   Advanced directives: Please bring a copy of your health care power of attorney and living will to the office at your convenience.  Conditions/risks identified: Recommend completing colon cancer screening.   Next appointment: Please follow up in one year for your Medicare Annual Wellness visit.    Preventive Care 40-64 Years, Male Preventive care refers to lifestyle choices and visits with your health care provider that can promote health and wellness. What does preventive care include?  A yearly physical exam. This is also called an annual well check.  Dental exams once or twice a year.  Routine eye exams. Ask your health care provider how often you should have your eyes checked.  Personal lifestyle choices, including:  Daily care of your teeth and gums.  Regular physical activity.  Eating a healthy diet.  Avoiding tobacco and drug use.  Limiting alcohol use.  Practicing safe sex.  Taking low-dose aspirin every day starting at age 20. What happens during an annual well check? The services and screenings done by your health care provider during your annual well check will depend on your age, overall health, lifestyle risk factors, and family history of disease. Counseling  Your health care provider may ask you  questions about your:  Alcohol use.  Tobacco use.  Drug use.  Emotional well-being.  Home and relationship well-being.  Sexual activity.  Eating habits.  Work and work Astronomer. Screening  You may have the following tests or measurements:  Height, weight, and BMI.  Blood pressure.  Lipid and cholesterol levels. These may be checked every 5 years, or more frequently if you are over 56 years old.  Skin check.  Lung cancer screening. You may have this screening every year starting at age 41 if you have a 30-pack-year history of smoking and currently smoke or have quit within the past 15 years.  Fecal occult blood test (FOBT) of the stool. You may have this test every year starting at age 42.  Flexible sigmoidoscopy or colonoscopy. You may have a sigmoidoscopy every 5 years or a colonoscopy every 10 years starting at age 38.  Prostate cancer screening. Recommendations will vary depending on your family history and other risks.  Hepatitis C blood test.  Hepatitis B blood test.  Sexually transmitted disease (STD) testing.  Diabetes screening. This is done by checking your blood sugar (glucose) after you have not eaten for a while (fasting). You may have this done every 1-3 years. Discuss your test results, treatment options, and if necessary, the need for more tests with your health care provider. Vaccines  Your health care provider may recommend certain vaccines, such as:  Influenza vaccine. This is recommended every year.  Tetanus, diphtheria, and acellular pertussis (Tdap, Td) vaccine. You may need a Td booster every 10 years.  Zoster vaccine. You may need this after age 69.  Pneumococcal 13-valent conjugate (PCV13) vaccine. You may need this if you have certain conditions  and have not been vaccinated.  Pneumococcal polysaccharide (PPSV23) vaccine. You may need one or two doses if you smoke cigarettes or if you have certain conditions. Talk to your health care  provider about which screenings and vaccines you need and how often you need them. This information is not intended to replace advice given to you by your health care provider. Make sure you discuss any questions you have with your health care provider. Document Released: 11/13/2015 Document Revised: 07/06/2016 Document Reviewed: 08/18/2015 Elsevier Interactive Patient Education  2017 [employer].  Fall Prevention in the Home Falls can cause injuries. They can happen to people of all ages. There are many things you can do to make your home safe and to help prevent falls. What can I do on the outside of my home?  Regularly fix the edges of walkways and driveways and fix any cracks.  Remove anything that might make you trip as you walk through a door, such as a raised step or threshold.  Trim any bushes or trees on the path to your home.  Use bright outdoor lighting.  Clear any walking paths of anything that might make someone trip, such as rocks or tools.  Regularly check to see if handrails are loose or broken. Make sure that both sides of any steps have handrails.  Any raised decks and porches should have guardrails on the edges.  Have any leaves, snow, or ice cleared regularly.  Use sand or salt on walking paths during winter.  Clean up any spills in your garage right away. This includes oil or grease spills. What can I do in the bathroom?  Use night lights.  Install grab bars by the toilet and in the tub and shower. Do not use towel bars as grab bars.  Use non-skid mats or decals in the tub or shower.  If you need to sit down in the shower, use a plastic, non-slip stool.  Keep the floor dry. Clean up any water that spills on the floor as soon as it happens.  Remove soap buildup in the tub or shower regularly.  Attach bath mats securely with double-sided non-slip rug tape.  Do not have throw rugs and other things on the floor that can make you trip. What can I do in  the bedroom?  Use night lights.  Make sure that you have a light by your bed that is easy to reach.  Do not use any sheets or blankets that are too big for your bed. They should not hang down onto the floor.  Have a firm chair that has side arms. You can use this for support while you get dressed.  Do not have throw rugs and other things on the floor that can make you trip. What can I do in the kitchen?  Clean up any spills right away.  Avoid walking on wet floors.  Keep items that you use a lot in easy-to-reach places.  If you need to reach something above you, use a strong step stool that has a grab bar.  Keep electrical cords out of the way.  Do not use floor polish or wax that makes floors slippery. If you must use wax, use non-skid floor wax.  Do not have throw rugs and other things on the floor that can make you trip. What can I do with my stairs?  Do not leave any items on the stairs.  Make sure that there are handrails on both sides of the stairs  and use them. Fix handrails that are broken or loose. Make sure that handrails are as long as the stairways.  Check any carpeting to make sure that it is firmly attached to the stairs. Fix any carpet that is loose or worn.  Avoid having throw rugs at the top or bottom of the stairs. If you do have throw rugs, attach them to the floor with carpet tape.  Make sure that you have a light switch at the top of the stairs and the bottom of the stairs. If you do not have them, ask someone to add them for you. What else can I do to help prevent falls?  Wear shoes that:  Do not have high heels.  Have rubber bottoms.  Are comfortable and fit you well.  Are closed at the toe. Do not wear sandals.  If you use a stepladder:  Make sure that it is fully opened. Do not climb a closed stepladder.  Make sure that both sides of the stepladder are locked into place.  Ask someone to hold it for you, if possible.  Clearly mark and  make sure that you can see:  Any grab bars or handrails.  First and last steps.  Where the edge of each step is.  Use tools that help you move around (mobility aids) if they are needed. These include:  Canes.  Walkers.  Scooters.  Crutches.  Turn on the lights when you go into a dark area. Replace any light bulbs as soon as they burn out.  Set up your furniture so you have a clear path. Avoid moving your furniture around.  If any of your floors are uneven, fix them.  If there are any pets around you, be aware of where they are.  Review your medicines with your doctor. Some medicines can make you feel dizzy. This can increase your chance of falling. Ask your doctor what other things that you can do to help prevent falls. This information is not intended to replace advice given to you by your health care provider. Make sure you discuss any questions you have with your health care provider. Document Released: 08/13/2009 Document Revised: 03/24/2016 Document Reviewed: 11/21/2014 Elsevier Interactive Patient Education  2017 [employer]

## 2019-04-04 ENCOUNTER — Ambulatory Visit: Payer: Medicare Other | Admitting: Family Medicine

## 2019-04-12 ENCOUNTER — Other Ambulatory Visit: Payer: Self-pay | Admitting: Family Medicine

## 2019-04-12 DIAGNOSIS — F419 Anxiety disorder, unspecified: Secondary | ICD-10-CM

## 2019-04-12 DIAGNOSIS — F840 Autistic disorder: Secondary | ICD-10-CM

## 2019-04-12 DIAGNOSIS — R451 Restlessness and agitation: Secondary | ICD-10-CM

## 2019-04-12 NOTE — Telephone Encounter (Signed)
Copied from [id number] [id number]. Topic: General - Other >> Apr 12, 2019 12:19 PM Lennox Solders wrote: Reason for CRM: pt sister dale is calling and the pt needs a refill on depakote 250 mg. Pt is out. Pt psychiatrist is close. [place]

## 2019-04-12 NOTE — Telephone Encounter (Unsigned)
Copied from [place] [id number]. Topic: Quick Communication - Rx Refill/Question >> Apr 12, 2019 12:38 PM Mcneil, Ja-Kwan wrote: Medication: risperiDONE (RISPERDAL) 2 MG tablet  Has the patient contacted their pharmacy? yes   Preferred Pharmacy (with phone number or street name): [facility], [place] [phone] (Phone) [phone] (Fax)  Agent: Please be advised that RX refills may take up to 3 business days. We ask that you follow-up with your pharmacy.

## 2019-04-13 MED ORDER — DEPAKOTE 250 MG PO TBEC: DELAYED_RELEASE_TABLET | ORAL | 0 refills | Status: DC

## 2019-04-16 ENCOUNTER — Encounter: Payer: Self-pay | Admitting: Family Medicine

## 2019-04-16 ENCOUNTER — Ambulatory Visit (INDEPENDENT_AMBULATORY_CARE_PROVIDER_SITE_OTHER): Payer: Medicare Other | Admitting: Family Medicine

## 2019-04-16 ENCOUNTER — Other Ambulatory Visit: Payer: Self-pay

## 2019-04-16 VITALS — BP 114/60 | HR 71 | Temp 98.2°F | Resp 16 | Ht 69.0 in | Wt 166.7 lb

## 2019-04-16 DIAGNOSIS — R4189 Other symptoms and signs involving cognitive functions and awareness: Secondary | ICD-10-CM

## 2019-04-16 DIAGNOSIS — R739 Hyperglycemia, unspecified: Secondary | ICD-10-CM

## 2019-04-16 DIAGNOSIS — Z79899 Other long term (current) drug therapy: Secondary | ICD-10-CM

## 2019-04-16 DIAGNOSIS — F840 Autistic disorder: Secondary | ICD-10-CM | POA: Diagnosis not present

## 2019-04-16 DIAGNOSIS — R569 Unspecified convulsions: Secondary | ICD-10-CM | POA: Diagnosis not present

## 2019-04-16 DIAGNOSIS — D649 Anemia, unspecified: Secondary | ICD-10-CM | POA: Diagnosis not present

## 2019-04-16 NOTE — Progress Notes (Signed)
Name: Erik Henderson   MRN: [id number]    DOB: 07/03/1961   Date:04/16/2019       Progress Note  Subjective  Chief Complaint  Chief Complaint  Patient presents with  . Medication Refill  . Agitation  . Active autistic disorder  . Dyslipidemia  . Anemia  . Seizures   Caregivers: sister [name]( [place] ) and brother Lisbeth Ply( Julius)   HPI  Seizure disorder: caregivers states he has been seizure free for many years, but they are afraid of stopping medication. He [facility], we will recheck levels.   Insulin Resistance:  he is on diabetic diet, his weight has been stable, no change in appetite,no polyuria, but likes to drink a lot of water. Last insulin level was elevated, however A1C has been at goal and we will check today since it has been over one year    Dyslipidemia:on life style modification, last level was at goal. We will recheck labs today, he is on high risk medications.   Autism spectrum disorder: recent change in behavior intermittently, he is now under the care of psychiatrist, on Lorazepam 1 mg BID and on higher dose of Risperdal at 2 mg in am and 4 mg at night, also taking melatonin at night . Sister states not going through zoning out episodes as often and does not last as long. He seems to be doing better, agitation has improved.   Anemia: he is not a good candidate for colon cancer screen, family states it will be difficulty to have colonoscopy, but states they can try hemoccult cards yearly    Patient Active Problem List   Diagnosis Date Noted  . Seizure (HCC) 04/16/2019  . Behavior concern in adult 09/22/2018  . Behavioral change 06/27/2018  . Intellectual disability 06/27/2018  . Hyperglycemia 08/17/2015  . Anxiety 04/14/2015  . Cognitive impairment 04/14/2015  . Autism spectrum disorder 02/23/2015  . Hay fever 02/21/2008    Past Surgical History:  Procedure Laterality Date  . NO PAST SURGERIES       Family History  Problem Relation Age of Onset  . Hypertension Mother   . Hyperlipidemia Mother   . Diabetes Mother   . Cancer Mother        stomach  . Coronary artery disease Father   . Gout Father   . Heart failure Father   . Cancer Father        prostate  . Hypertension Brother   . Autism Brother   . Mental retardation Brother     Social History   Socioeconomic History  . Marital status: Single    Spouse name: Not on file  . Number of children: 0  . Years of education: Not on file  . Highest education level: Never attended school  Occupational History  . Occupation: disabled  Social Needs  . Financial resource strain: Not very hard  . Food insecurity    Worry: Never true    Inability: Never true  . Transportation needs    Medical: No    Non-medical: No  Tobacco Use  . Smoking status: Never Smoker  . Smokeless tobacco: Never Used  . Tobacco comment: smoking cessation materials not required  Substance and Sexual Activity  . Alcohol use: No    Alcohol/week: 0.0 standard drinks  . Drug use: No  . Sexual activity: Never  Lifestyle  . Physical activity    Days per week: 0 days  Minutes per session: 0 min  . Stress: Not at all  Relationships  . Social Musicianconnections    Talks on phone: Never    Gets together: Never    Attends religious service: Never    Active member of club or organization: No    Attends meetings of clubs or organizations: Never    Relationship status: Never married  . Intimate partner violence    Fear of current or ex partner: No    Emotionally abused: No    Physically abused: No    Forced sexual activity: No  Other Topics Concern  . Not on file  Social History Narrative   Lives with mother, brothers and sisters; sisters are caregivers; mother is guardian/POA -- family discussing transfer of guardianship/POA to sister as parents in poor health   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                      Current Outpatient Medications:  .  DEPAKOTE 250 MG DR tablet, TAKE ONE TABLET BY MOUTH DAILY AT BREAKFAST THEN TAKE ONE TABLET AT LUNCH AND TAKE TWO TABLETS BY MOUTH AT BEDTIME, Disp: 120 tablet, Rfl: 0 .  GNP MELATONIN 3 MG TABS, Take 1 tablet by mouth every evening., Disp: , Rfl:  .  LORazepam (ATIVAN) 1 MG tablet, Take one tablet by mouth twice daily, Disp: , Rfl:  .  Melatonin 300 MCG TABS, Take by mouth. Take 1-2 tablets by mouth at bedtime, Disp: , Rfl:  .  risperiDONE (RISPERDAL) 2 MG tablet, TAKE 1 TABLET BY MOUTH IN THE MORNING. (Patient taking differently: Take 2 mg by mouth 2 (two) times daily. 2 mg in the morning and 4 mg in the pm Prescribed by Willis ModenaLarry Barnhill), Disp: 90 tablet, Rfl: 0  Allergies  Allergen Reactions  . Pollen Extract     Other reaction(s): Other (See Comments) Patent allergic to dust, that causes pt to sneeze.    I personally reviewed active problem list, medication list, allergies, family history, social history with the patient/caregiver today.   ROS  Ten systems reviewed and is negative except as mentioned in HPI   Objective  Vitals:   04/16/19 1121  BP: 114/60  Pulse: 71  Resp: 16  Temp: 98.2 F (36.8 C)  TempSrc: Oral  SpO2: 99%  Weight: 166 lb 11.2 oz (75.6 kg)  Height: 5\' 9"  (1.753 m)    Body mass index is 24.62 kg/m.  Physical Exam  Constitutional: Patient appears well-developed and well-nourished. No distress.  HEENT: head atraumatic, normocephalic, pupils equal and reactive to light, neck supple, oral exam not done  Cardiovascular: Normal rate, regular rhythm and normal heart sounds.  No murmur heard. No BLE edema. Pulmonary/Chest: Effort normal and breath sounds normal. No respiratory distress. Abdominal: Soft.  There is no tenderness. Psychiatric: Patient has a flat affect, humming   PHQ2/9: Depression screen [facility] 2/9 07/24/2018 07/05/2018 06/20/2018 06/09/2017 06/06/2016  Decreased Interest 0 0 0 0 0  Down, Depressed,  Hopeless 0 0 0 0 0  PHQ - 2 Score 0 0 0 0 0  Altered sleeping 3 3 3  - -  Tired, decreased energy 0 0 0 - -  Change in appetite 3 3 3  - -  Feeling bad or failure about yourself  0 0 0 - -  Trouble concentrating 3 3 3  - -  Moving slowly or fidgety/restless 3 3 3  - -  Suicidal thoughts 0 0  0 - -  PHQ-9 Score 12 12 12  - -    phq 9 is negative   Fall Risk: Fall Risk  04/16/2019 03/14/2019 07/05/2018 03/09/2018 02/06/2018  Falls in the past year? 0 0 No No No  Number falls in past yr: 0 0 - - -  Injury with Fall? 0 0 - - -  Risk for fall due to : - - - Other (Comment) -  Risk for fall due to: Comment - - - autism; shuffling gait -  Follow up - Falls prevention discussed - - -    Functional Status Survey: Is the patient deaf or have difficulty hearing?: No Does the patient have difficulty seeing, even when wearing glasses/contacts?: No Does the patient have difficulty concentrating, remembering, or making decisions?: Yes Does the patient have difficulty walking or climbing stairs?: No Does the patient have difficulty dressing or bathing?: Yes Does the patient have difficulty doing errands alone such as visiting a doctor's office or shopping?: Yes    Assessment & Plan  1. Seizure ([place])  - Valproic Acid level  2. Active autistic disorder  Under the care of psychiatrist, stable   3. Cognitive impairment  stable  4. Hyperglycemia  Recheck level   5. Anemia, unspecified type  - CBC with Differential/Platelet - Iron, TIBC and Ferritin Panel - Methylmalonic Acid  6. Long-term use of high-risk medication  - Lipid panel - COMPLETE METABOLIC PANEL WITH GFR - Hemoglobin A1c - Methylmalonic Acid

## 2019-04-19 LAB — CBC WITH DIFFERENTIAL/PLATELET
Absolute Monocytes: 515 cells/uL (ref 200–950)
Basophils Absolute: 30 cells/uL (ref 0–200)
Basophils Relative: 0.6 %
Eosinophils Absolute: 130 cells/uL (ref 15–500)
Eosinophils Relative: 2.6 %
HCT: 39.5 % (ref 38.5–50.0)
Hemoglobin: 13.1 g/dL — ABNORMAL LOW (ref 13.2–17.1)
Lymphs Abs: 2025 cells/uL (ref 850–3900)
MCH: 29.6 pg (ref 27.0–33.0)
MCHC: 33.2 g/dL (ref 32.0–36.0)
MCV: 89.2 fL (ref 80.0–100.0)
MPV: 10.1 fL (ref 7.5–12.5)
Monocytes Relative: 10.3 %
Neutro Abs: 2300 cells/uL (ref 1500–7800)
Neutrophils Relative %: 46 %
Platelets: 226 10*3/uL (ref 140–400)
RBC: 4.43 10*6/uL (ref 4.20–5.80)
RDW: 12.1 % (ref 11.0–15.0)
Total Lymphocyte: 40.5 %
WBC: 5 10*3/uL (ref 3.8–10.8)

## 2019-04-19 LAB — COMPLETE METABOLIC PANEL WITH GFR
AG Ratio: 1.2 (calc) (ref 1.0–2.5)
ALT: 9 U/L (ref 9–46)
AST: 16 U/L (ref 10–35)
Albumin: 4.2 g/dL (ref 3.6–5.1)
Alkaline phosphatase (APISO): 45 U/L (ref 35–144)
BUN: 11 mg/dL (ref 7–25)
CO2: 27 mmol/L (ref 20–32)
Calcium: 9.9 mg/dL (ref 8.6–10.3)
Chloride: 99 mmol/L (ref 98–110)
Creat: 1.06 mg/dL (ref 0.70–1.33)
GFR, Est African American: 90 mL/min/{1.73_m2} (ref >=60)
GFR, Est Non African American: 78 mL/min/{1.73_m2} (ref >=60)
Globulin: 3.6 g/dL (calc) (ref 1.9–3.7)
Glucose, Bld: 87 mg/dL (ref 65–99)
Potassium: 4.5 mmol/L (ref 3.5–5.3)
Sodium: 137 mmol/L (ref 135–146)
Total Bilirubin: 0.4 mg/dL (ref 0.2–1.2)
Total Protein: 7.8 g/dL (ref 6.1–8.1)

## 2019-04-19 LAB — IRON,TIBC AND FERRITIN PANEL
%SAT: 18 % (calc) — ABNORMAL LOW (ref 20–48)
Ferritin: 211 ng/mL (ref 38–380)
Iron: 56 ug/dL (ref 50–180)
TIBC: 307 mcg/dL (calc) (ref 250–425)

## 2019-04-19 LAB — HEMOGLOBIN A1C
Hgb A1c MFr Bld: 5.2 % of total Hgb (ref <5.7)
Mean Plasma Glucose: 103 (calc)
eAG (mmol/L): 5.7 (calc)

## 2019-04-19 LAB — LIPID PANEL
Cholesterol: 183 mg/dL (ref <200)
HDL: 67 mg/dL (ref >=40)
LDL Cholesterol (Calc): 101 mg/dL (calc) — ABNORMAL HIGH
Non-HDL Cholesterol (Calc): 116 mg/dL (calc) (ref <130)
Total CHOL/HDL Ratio: 2.7 (calc) (ref <5.0)
Triglycerides: 68 mg/dL (ref <150)

## 2019-04-19 LAB — VALPROIC ACID LEVEL: Valproic Acid Lvl: 82.2 mg/L (ref 50.0–100.0)

## 2019-04-19 LAB — METHYLMALONIC ACID, SERUM: Methylmalonic Acid, Quant: 78 nmol/L — ABNORMAL LOW (ref 87–318)

## 2019-05-29 DIAGNOSIS — R4701 Aphasia: Secondary | ICD-10-CM | POA: Insufficient documentation

## 2019-05-29 DIAGNOSIS — F99 Mental disorder, not otherwise specified: Secondary | ICD-10-CM | POA: Insufficient documentation

## 2019-05-29 DIAGNOSIS — F79 Unspecified intellectual disabilities: Secondary | ICD-10-CM | POA: Diagnosis not present

## 2019-05-29 DIAGNOSIS — G4700 Insomnia, unspecified: Secondary | ICD-10-CM | POA: Diagnosis not present

## 2019-05-29 DIAGNOSIS — Z79899 Other long term (current) drug therapy: Secondary | ICD-10-CM | POA: Diagnosis not present

## 2019-05-29 DIAGNOSIS — F5105 Insomnia due to other mental disorder: Secondary | ICD-10-CM | POA: Insufficient documentation

## 2019-05-29 DIAGNOSIS — F445 Conversion disorder with seizures or convulsions: Secondary | ICD-10-CM | POA: Diagnosis not present

## 2019-05-29 DIAGNOSIS — K219 Gastro-esophageal reflux disease without esophagitis: Secondary | ICD-10-CM | POA: Insufficient documentation

## 2019-05-29 DIAGNOSIS — F840 Autistic disorder: Secondary | ICD-10-CM | POA: Diagnosis not present

## 2019-08-16 ENCOUNTER — Telehealth: Payer: Self-pay | Admitting: Family Medicine

## 2019-08-16 DIAGNOSIS — R451 Restlessness and agitation: Secondary | ICD-10-CM

## 2019-08-16 DIAGNOSIS — R1110 Vomiting, unspecified: Secondary | ICD-10-CM | POA: Diagnosis not present

## 2019-08-16 DIAGNOSIS — F840 Autistic disorder: Secondary | ICD-10-CM

## 2019-08-16 DIAGNOSIS — K5900 Constipation, unspecified: Secondary | ICD-10-CM | POA: Diagnosis not present

## 2019-08-16 DIAGNOSIS — J069 Acute upper respiratory infection, unspecified: Secondary | ICD-10-CM | POA: Diagnosis not present

## 2019-08-16 NOTE — Telephone Encounter (Signed)
Medication Refill - Medication: risperiDONE (RISPERDAL) 2 MG tablet    Has the patient contacted their pharmacy? Yes.   Pt is completely out of the medication. Please advise.  (Agent: If no, request that the patient contact the pharmacy for the refill.) (Agent: If yes, when and what did the pharmacy advise?)  Preferred Pharmacy (with phone number or street name):  [place], [place]  [address] [place] [address]  Phone: [phone] Fax: [phone]  Not a 24 hour pharmacy; exact hours not known.     Agent: Please be advised that RX refills may take up to 3 business days. We ask that you follow-up with your pharmacy.

## 2019-08-17 DIAGNOSIS — R1110 Vomiting, unspecified: Secondary | ICD-10-CM | POA: Diagnosis not present

## 2019-08-19 NOTE — Telephone Encounter (Signed)
Patient sister Tukker Byrns called stating  Patient does not have any more pills, is requesting why PCP denied medications. Call back [phone]

## 2019-08-20 ENCOUNTER — Ambulatory Visit: Payer: Medicare Other

## 2019-08-20 NOTE — Telephone Encounter (Signed)
Patient's sister stated that she has been calling to get this medication through Dr.Barnhill and she has not been able to get them to send it. She is going to go to the office to see what is going on.

## 2019-08-21 NOTE — Telephone Encounter (Signed)
Called [facility] in regards to contacting patient about medication refills. They said that they would reach out to Erik Henderson and place a refill request.

## 2019-08-27 ENCOUNTER — Ambulatory Visit: Payer: Medicare Other

## 2019-09-20 ENCOUNTER — Ambulatory Visit (INDEPENDENT_AMBULATORY_CARE_PROVIDER_SITE_OTHER): Payer: Medicare Other

## 2019-09-20 ENCOUNTER — Other Ambulatory Visit: Payer: Self-pay

## 2019-09-20 DIAGNOSIS — Z23 Encounter for immunization: Secondary | ICD-10-CM | POA: Diagnosis not present

## 2019-10-16 ENCOUNTER — Ambulatory Visit: Payer: Medicare Other | Admitting: Family Medicine

## 2019-11-20 DIAGNOSIS — F99 Mental disorder, not otherwise specified: Secondary | ICD-10-CM | POA: Diagnosis not present

## 2019-11-20 DIAGNOSIS — F69 Unspecified disorder of adult personality and behavior: Secondary | ICD-10-CM | POA: Diagnosis not present

## 2019-11-20 DIAGNOSIS — Z79899 Other long term (current) drug therapy: Secondary | ICD-10-CM | POA: Diagnosis not present

## 2019-11-20 DIAGNOSIS — F79 Unspecified intellectual disabilities: Secondary | ICD-10-CM | POA: Diagnosis not present

## 2019-11-20 DIAGNOSIS — R4701 Aphasia: Secondary | ICD-10-CM | POA: Diagnosis not present

## 2019-11-20 DIAGNOSIS — F5105 Insomnia due to other mental disorder: Secondary | ICD-10-CM | POA: Diagnosis not present

## 2019-12-11 ENCOUNTER — Telehealth: Payer: Self-pay | Admitting: Family Medicine

## 2019-12-11 NOTE — Telephone Encounter (Signed)
Pt caregiver called and stated that he needs a PA for brand name DEPAKOTE 250 MG DR tablet [[id number]]   Pt is out of medication

## 2019-12-16 ENCOUNTER — Telehealth: Payer: Self-pay | Admitting: Family Medicine

## 2019-12-16 NOTE — Telephone Encounter (Signed)
Erik Henderson has came in office saying that Erik Henderson medication for Depakote. He can not take the generic form and they had to pay 400.00 for the brand name. [place] Spaniel that someone here had helped them last to be able to afford the brand name. Needs someone to call them about this. Phone [phone] or [phone]

## 2019-12-17 NOTE — Telephone Encounter (Signed)
No . Erik Henderson tried all last year to get one so she could do a referral but never brought one and we asked several times for it.

## 2019-12-18 NOTE — Telephone Encounter (Signed)
Found out patient Insurance through calling the pharmacy. He has [place], ID # [id number] Bin [id number] Group [id number] and PCN # [id number]. I initiated a PA on covermymeds for the patient Depakote and notified his caregivers.

## 2019-12-20 NOTE — Telephone Encounter (Signed)
Pts sister called back upset because his mom had to pay out of pocket for his meds  and I explained to her that we have been trying to get new insurance cards for months. I explained that he has [employer] and we need the card to do what needs to be done. She states she will bring them in on Monday.

## 2019-12-23 NOTE — Telephone Encounter (Signed)
Erik Henderson (sister) came by the office and was informed. She verbalized understanding and will call the insurance company. She will also bring back a copy of the [employer] card

## 2019-12-23 NOTE — Telephone Encounter (Signed)
Spoke with pharmacist at [facility] and notified them we got an approved for Erik Henderson Depakote. However, the pharmacist states it was never the drug needing a PA, it is that [facility] paid all but $10 a month for it last year. Now this year they are not paying for any of the drug. So it was $430 a month for it. Pharmacist explained they will need to call Insurance and find out what has changed and see what is covered. Then go over what is covered with their doctor on their next visit.

## 2019-12-23 NOTE — Telephone Encounter (Signed)
Erik Henderson is calling back to report the [facility] insurance card  Member [phone]

## 2019-12-24 NOTE — Telephone Encounter (Signed)
Spoke with [name] (sister) and she has ordered the card and wil bring it by once it comes in the mail. She forgot to ask the insurance company about thee medication and will call them back.

## 2020-01-10 NOTE — Progress Notes (Signed)
This encounter was created in error - please disregard.

## 2020-01-13 ENCOUNTER — Telehealth: Payer: Self-pay | Admitting: Family Medicine

## 2020-01-13 NOTE — Telephone Encounter (Signed)
Dale pts care giver would like a call back about pts Depacote. States insurance doesn't cover this medication. Pts sister is asking for a generic. [phone]

## 2020-01-14 NOTE — Telephone Encounter (Signed)
Erik Henderson called again today and said that they have tried once before to switch him to depacote and it did not work good for him. She is asking for a different brand. Mom had to pay $400 the last time and is not able to keep paying that. And the [employer] [facility] [employer] [facility] insurance will only cover 10% of the medication. He is completely out. Took last pill yesterday

## 2020-01-14 NOTE — Telephone Encounter (Signed)
Copied from CRM [id number]. Topic: General - Inquiry >> Jan 14, 2020  2:48 PM Lynne Logan D wrote: Reason for CRM: Dr. Claudean Kinds from [facility] is requesting labs for pt to check Depakote/Valproic Acid levels. Pt would like to have this done at office and is requesting order from Dr. Carlynn Purl. Reach out to pt so he knows when he can visit lab. Also requesting fax with results to be sent to Dr. Claudean Kinds [phone]

## 2020-01-15 ENCOUNTER — Other Ambulatory Visit: Payer: Self-pay | Admitting: Family Medicine

## 2020-01-15 DIAGNOSIS — R569 Unspecified convulsions: Secondary | ICD-10-CM

## 2020-01-15 DIAGNOSIS — Z79899 Other long term (current) drug therapy: Secondary | ICD-10-CM

## 2020-01-15 DIAGNOSIS — E538 Deficiency of other specified B group vitamins: Secondary | ICD-10-CM

## 2020-01-15 NOTE — Telephone Encounter (Signed)
Called patient's caretaker. She is aware.

## 2020-02-12 ENCOUNTER — Other Ambulatory Visit: Payer: Self-pay | Admitting: Family Medicine

## 2020-02-12 DIAGNOSIS — F419 Anxiety disorder, unspecified: Secondary | ICD-10-CM

## 2020-02-12 DIAGNOSIS — F840 Autistic disorder: Secondary | ICD-10-CM

## 2020-02-12 NOTE — Telephone Encounter (Signed)
Requested medication (s) are due for refill today: yes  Requested medication (s) are on the active medication list: yes  Last refill: 04/13/2019  Future visit scheduled: no  Notes to clinic:  not delegated    Requested Prescriptions  Pending Prescriptions Disp Refills   DEPAKOTE 250 MG DR tablet 120 tablet 0    Sig: TAKE ONE TABLET BY MOUTH DAILY AT BREAKFAST THEN TAKE ONE TABLET AT LUNCH AND TAKE TWO TABLETS BY MOUTH AT BEDTIME      Not Delegated - Neurology:  Anticonvulsants - Valproates Failed - 02/12/2020  3:21 PM      Failed - This refill cannot be delegated      Failed - HGB in normal range and within 360 days    Hemoglobin  Date Value Ref Range Status  04/16/2019 13.1 (L) 13.2 - 17.1 g/dL Final          Passed - AST in normal range and within 360 days    AST  Date Value Ref Range Status  04/16/2019 16 10 - 35 U/L Final          Passed - ALT in normal range and within 360 days    ALT  Date Value Ref Range Status  04/16/2019 9 9 - 46 U/L Final          Passed - PLT in normal range and within 360 days    Platelets  Date Value Ref Range Status  04/16/2019 226 140 - 400 Thousand/uL Final          Passed - WBC in normal range and within 360 days    WBC  Date Value Ref Range Status  04/16/2019 5.0 3.8 - 10.8 Thousand/uL Final          Passed - HCT in normal range and within 360 days    HCT  Date Value Ref Range Status  04/16/2019 39.5 38.5 - 50.0 % Final          Passed - Valproic Acid (serum) in normal range and within 360 days    Valproic Acid Lvl  Date Value Ref Range Status  04/16/2019 82.2 50.0 - 100.0 mg/L Final          Passed - Valid encounter within last 12 months    Recent Outpatient Visits           10 months ago Long-term use of high-risk medication   [facility] Alba Cory, MD   1 year ago Active autistic disorder   [facility] Alba Cory, MD   1 year ago Urinary frequency   [facility] Alba Cory, MD   1 year ago Seizure [facility])   [facility] Alba Cory, MD   2 years ago Agitation   [facility] Alba Cory, MD

## 2020-02-12 NOTE — Telephone Encounter (Signed)
Medication Refill - Medication: DEPAKOTE 250 MG DR tablet    Preferred Pharmacy (with phone number or street name):  [facility] [place] - [place], [place] - [address] Phone:  [phone]  Fax:  [phone]       Agent: Please be advised that RX refills may take up to 3 business days. We ask that you follow-up with your pharmacy.

## 2020-02-13 NOTE — Telephone Encounter (Signed)
Spoke with pt mom and appt has been scheduled for 4.22.2021

## 2020-02-20 ENCOUNTER — Other Ambulatory Visit: Payer: Self-pay

## 2020-02-20 ENCOUNTER — Encounter: Payer: Self-pay | Admitting: Family Medicine

## 2020-02-20 ENCOUNTER — Ambulatory Visit (INDEPENDENT_AMBULATORY_CARE_PROVIDER_SITE_OTHER): Payer: Medicare Other | Admitting: Family Medicine

## 2020-02-20 VITALS — BP 110/80 | HR 75 | Temp 97.3°F | Resp 16 | Ht 69.0 in | Wt 152.2 lb

## 2020-02-20 DIAGNOSIS — R569 Unspecified convulsions: Secondary | ICD-10-CM

## 2020-02-20 DIAGNOSIS — F840 Autistic disorder: Secondary | ICD-10-CM

## 2020-02-20 DIAGNOSIS — R739 Hyperglycemia, unspecified: Secondary | ICD-10-CM | POA: Diagnosis not present

## 2020-02-20 DIAGNOSIS — R451 Restlessness and agitation: Secondary | ICD-10-CM | POA: Diagnosis not present

## 2020-02-20 DIAGNOSIS — E538 Deficiency of other specified B group vitamins: Secondary | ICD-10-CM

## 2020-02-20 DIAGNOSIS — Z79899 Other long term (current) drug therapy: Secondary | ICD-10-CM | POA: Diagnosis not present

## 2020-02-20 NOTE — Progress Notes (Signed)
Name: Erik Henderson   MRN: [id number]    DOB: May 30, 1961   Date:02/20/2020       Progress Note  Subjective  Chief Complaint  Chief Complaint  Patient presents with  . Medication Refill    HPI  Seizure disorder: caregivers states he has been seizure free for many years, but they are afraid of stopping medication. He is getting valproic acid from psychiatrist Dr. Marvis Moeller at [facility], we will recheck levels today. .   Insulin Resistance:  he is on diabetic diet, he has lost weight since off Risperdal, no change in appetite,no polyuria, but likes to drink a lot of water. We will recheck A1C today    Dyslipidemia:on life style modification, last level was okay, no needs for statin therapy   Autism spectrum disorder: recent change in behavior intermittently, he is now under the care of psychiatrist, on Lorazepam 1 mg prn agitation, plus clonazepam 0.5 TID and depakote given by psychiatrist   Anemia: he is not a good candidate for colon cancer screen, family states it will be difficulty to have colonoscopy, but states they can try hemoccult cards yearly, but they have not brought it back yet    Patient Active Problem List   Diagnosis Date Noted  . 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 GERD (gastroesophageal reflux disease) 05/29/2019  . 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 Seizure (HCC) 04/16/2019  . Behavior concern in adult 09/22/2018  . Behavioral change 06/27/2018  . Intellectual disability 06/27/2018  . Hyperglycemia 08/17/2015  . Anxiety 04/14/2015  . Cognitive impairment 04/14/2015  . Autism spectrum disorder 02/23/2015  . Hay fever 02/21/2008    Past Surgical History:  Procedure Laterality Date  . NO PAST SURGERIES      Family History  Problem Relation Age of Onset  . Hypertension Mother   . Hyperlipidemia Mother   . Diabetes Mother   . Cancer Mother        stomach  . Coronary artery disease Father   . Gout Father   . Heart failure Father   . Cancer  Father        prostate  . Hypertension Brother   . Autism Brother   . Mental retardation Brother     Social History   Tobacco Use  . Smoking status: Never Smoker  . Smokeless tobacco: Never Used  . Tobacco comment: smoking cessation materials not required  Substance Use Topics  . Alcohol use: No    Alcohol/week: 0.0 standard drinks     Current Outpatient Medications:  .  clonazePAM (KLONOPIN) 0.5 MG tablet, , Disp: , Rfl:  .  DEPAKOTE 250 MG DR tablet, TAKE ONE TABLET BY MOUTH DAILY AT BREAKFAST THEN TAKE ONE TABLET AT LUNCH AND TAKE TWO TABLETS BY MOUTH AT BEDTIME, Disp: 120 tablet, Rfl: 0 .  GNP MELATONIN 3 MG TABS, Take 1 tablet by mouth every evening., Disp: , Rfl:  .  LORazepam (ATIVAN) 1 MG tablet, Take one tablet by mouth twice daily, Disp: , Rfl:  .  Melatonin 300 MCG TABS, Take by mouth. Take 1-2 tablets by mouth at bedtime, Disp: , Rfl:   Allergies  Allergen Reactions  . Pollen Extract     Other reaction(s): Other (See Comments) Patent allergic to dust, that causes pt to sneeze.    I personally reviewed active problem list, medication list, allergies, family history, social history, health maintenance with the patient/caregiver today.   ROS  Constitutional: Negative for fever , positive for  weight change.  Respiratory: Negative for cough and shortness of breath.   Cardiovascular: Negative for chest pain or palpitations.  Gastrointestinal: Negative for abdominal pain, no bowel changes.  Musculoskeletal: Negative for gait problem or joint swelling.  Skin: Negative for rash.  Neurological: Negative for dizziness or headache.  No other specific complaints in a complete review of systems (except as listed in HPI above).  Objective  Vitals:   02/20/20 1021  BP: 110/80  Pulse: 75  Resp: 16  Temp: (!) 97.3 F (36.3 C)  TempSrc: Temporal  SpO2: 98%  Weight: 152 lb 3.2 oz (69 kg)  Height: 5\' 9"  (1.753 m)    Body mass index is 22.48 kg/m.  Physical  Exam  Constitutional: Patient appears well-developed and well-nourished. No distress.  HEENT: head atraumatic, normocephalic, pupils equal and reactive to light Cardiovascular: Normal rate, regular rhythm and normal heart sounds.  No murmur heard. No BLE edema. Pulmonary/Chest: Effort normal and breath sounds normal. No respiratory distress. Abdominal: Soft.  There is no tenderness. Psychiatric: sitting and rocking back and forth, calm     Fall Risk: Fall Risk  02/20/2020 04/16/2019 03/14/2019 07/05/2018 03/09/2018  Falls in the past year? 0 0 0 No No  Number falls in past yr: 0 0 0 - -  Injury with Fall? 0 0 0 - -  Risk for fall due to : - - - - Other (Comment)  Risk for fall due to: Comment - - - - autism; shuffling gait  Follow up - - Falls prevention discussed - -     Assessment & Plan  1. Active autistic disorder  Seeing psychiatrist at [facility]  2. Seizure ([place])  No episodes in years   3. B12 deficiency  Needs labs today   4. Agitation  stable  5. Hyperglycemia  Recheck labs today

## 2020-02-21 LAB — CBC WITH DIFFERENTIAL/PLATELET
Absolute Monocytes: 495 cells/uL (ref 200–950)
Basophils Absolute: 30 cells/uL (ref 0–200)
Basophils Relative: 0.7 %
Eosinophils Absolute: 99 cells/uL (ref 15–500)
Eosinophils Relative: 2.3 %
HCT: 43.9 % (ref 38.5–50.0)
Hemoglobin: 14.6 g/dL (ref 13.2–17.1)
Lymphs Abs: 1797 cells/uL (ref 850–3900)
MCH: 29.5 pg (ref 27.0–33.0)
MCHC: 33.3 g/dL (ref 32.0–36.0)
MCV: 88.7 fL (ref 80.0–100.0)
MPV: 10.1 fL (ref 7.5–12.5)
Monocytes Relative: 11.5 %
Neutro Abs: 1879 cells/uL (ref 1500–7800)
Neutrophils Relative %: 43.7 %
Platelets: 224 10*3/uL (ref 140–400)
RBC: 4.95 10*6/uL (ref 4.20–5.80)
RDW: 12.1 % (ref 11.0–15.0)
Total Lymphocyte: 41.8 %
WBC: 4.3 10*3/uL (ref 3.8–10.8)

## 2020-02-21 LAB — COMPLETE METABOLIC PANEL WITH GFR
AG Ratio: 1.2 (calc) (ref 1.0–2.5)
ALT: 12 U/L (ref 9–46)
AST: 21 U/L (ref 10–35)
Albumin: 4.3 g/dL (ref 3.6–5.1)
Alkaline phosphatase (APISO): 53 U/L (ref 35–144)
BUN: 9 mg/dL (ref 7–25)
CO2: 31 mmol/L (ref 20–32)
Calcium: 9.8 mg/dL (ref 8.6–10.3)
Chloride: 102 mmol/L (ref 98–110)
Creat: 0.99 mg/dL (ref 0.70–1.33)
GFR, Est African American: 97 mL/min/{1.73_m2} (ref >=60)
GFR, Est Non African American: 84 mL/min/{1.73_m2} (ref >=60)
Globulin: 3.6 g/dL (calc) (ref 1.9–3.7)
Glucose, Bld: 76 mg/dL (ref 65–99)
Potassium: 4.2 mmol/L (ref 3.5–5.3)
Sodium: 139 mmol/L (ref 135–146)
Total Bilirubin: 0.6 mg/dL (ref 0.2–1.2)
Total Protein: 7.9 g/dL (ref 6.1–8.1)

## 2020-02-21 LAB — B12 AND FOLATE PANEL
Folate: >24 ng/mL
Vitamin B-12: >2000 pg/mL — ABNORMAL HIGH (ref 200–1100)

## 2020-02-21 LAB — HEMOGLOBIN A1C
Hgb A1c MFr Bld: 5.2 % of total Hgb (ref <5.7)
Mean Plasma Glucose: 103 (calc)
eAG (mmol/L): 5.7 (calc)

## 2020-02-21 LAB — VALPROIC ACID LEVEL: Valproic Acid Lvl: 49.9 mg/L — ABNORMAL LOW (ref 50.0–100.0)

## 2020-02-25 ENCOUNTER — Ambulatory Visit: Payer: 59 | Admitting: Family Medicine

## 2020-03-18 DIAGNOSIS — F5105 Insomnia due to other mental disorder: Secondary | ICD-10-CM | POA: Diagnosis not present

## 2020-03-18 DIAGNOSIS — R4701 Aphasia: Secondary | ICD-10-CM | POA: Diagnosis not present

## 2020-03-18 DIAGNOSIS — F99 Mental disorder, not otherwise specified: Secondary | ICD-10-CM | POA: Diagnosis not present

## 2020-03-18 DIAGNOSIS — F79 Unspecified intellectual disabilities: Secondary | ICD-10-CM | POA: Diagnosis not present

## 2020-03-19 ENCOUNTER — Ambulatory Visit: Payer: Medicare Other

## 2020-03-23 ENCOUNTER — Other Ambulatory Visit: Payer: Self-pay

## 2020-03-23 ENCOUNTER — Ambulatory Visit (INDEPENDENT_AMBULATORY_CARE_PROVIDER_SITE_OTHER): Payer: Medicare Other | Admitting: Family Medicine

## 2020-03-23 ENCOUNTER — Encounter: Payer: Self-pay | Admitting: Family Medicine

## 2020-03-23 VITALS — BP 118/72 | HR 74 | Temp 97.5°F | Resp 18 | Ht 69.0 in | Wt 153.4 lb

## 2020-03-23 DIAGNOSIS — K0889 Other specified disorders of teeth and supporting structures: Secondary | ICD-10-CM

## 2020-03-23 DIAGNOSIS — K089 Disorder of teeth and supporting structures, unspecified: Secondary | ICD-10-CM | POA: Diagnosis not present

## 2020-03-23 DIAGNOSIS — R633 Feeding difficulties: Secondary | ICD-10-CM

## 2020-03-23 NOTE — Progress Notes (Signed)
Name: Erik Henderson   MRN: [id number]    DOB: 1960-12-26   Date:03/23/2020       Progress Note  Subjective  Chief Complaint  Chief Complaint  Patient presents with  . Follow-up    1 month recheck    HPI  Chewing problems: caregivers have stated that over the past two months he has been eating faster than usual, and not chewing his food. Sister [name] ) states their mother would like to have all his teeth pulled. He does not seem to be in pain. They have been blending his meals to avoid chocking. He has not chocked yet.  Malnutrition: he has lost 13 lbs in the past year, per brother now stable, the weight loss happened last year, he lost it quickly in about 6 months. His weight is stable now, not sure of any changes. He needs to have either hemoccult cards or cologuard, per brother not a good candidate for colonoscopy because of the prep. Labs done in April were reviewed today, anemia resolved.   Patient Active Problem List   Diagnosis Date Noted  . 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 GERD (gastroesophageal reflux disease) 05/29/2019  . 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 Seizure (HCC) 04/16/2019  . Behavior concern in adult 09/22/2018  . Behavioral change 06/27/2018  . Intellectual disability 06/27/2018  . Hyperglycemia 08/17/2015  . Anxiety 04/14/2015  . Cognitive impairment 04/14/2015  . Autism spectrum disorder 02/23/2015  . Hay fever 02/21/2008    Past Surgical History:  Procedure Laterality Date  . NO PAST SURGERIES      Family History  Problem Relation Age of Onset  . Hypertension Mother   . Hyperlipidemia Mother   . Diabetes Mother   . Cancer Mother        stomach  . Coronary artery disease Father   . Gout Father   . Heart failure Father   . Cancer Father        prostate  . Hypertension Brother   . Autism Brother   . Mental retardation Brother     Social History   Tobacco Use  . Smoking status: Never Smoker  . Smokeless tobacco: Never Used  .  Tobacco comment: smoking cessation materials not required  Substance Use Topics  . Alcohol use: No    Alcohol/week: 0.0 standard drinks     Current Outpatient Medications:  .  clonazePAM (KLONOPIN) 0.5 MG tablet, , Disp: , Rfl:  .  DEPAKOTE 250 MG DR tablet, TAKE ONE TABLET BY MOUTH DAILY AT BREAKFAST THEN TAKE ONE TABLET AT LUNCH AND TAKE TWO TABLETS BY MOUTH AT BEDTIME, Disp: 120 tablet, Rfl: 0 .  GNP MELATONIN 3 MG TABS, Take 1 tablet by mouth every evening., Disp: , Rfl:  .  LORazepam (ATIVAN) 1 MG tablet, Take one tablet by mouth twice daily, Disp: , Rfl:  .  Melatonin 300 MCG TABS, Take by mouth. Take 1-2 tablets by mouth at bedtime, Disp: , Rfl:   Allergies  Allergen Reactions  . Pollen Extract     Other reaction(s): Other (See Comments) Patent allergic to dust, that causes pt to sneeze.    I personally reviewed active problem list, medication list, allergies, family history, social history, health maintenance with the patient/caregiver today.   ROS  Ten systems reviewed and is negative except as mentioned in HPI   Objective  Vitals:   03/23/20 1116  BP: 118/72  Pulse: 74  Resp: 18  Temp: (!) 97.5 F (  36.4 C)  TempSrc: Temporal  SpO2: 97%  Weight: 153 lb 6.4 oz (69.6 kg)  Height: 5\' 9"  (1.753 m)    Body mass index is 22.65 kg/m.  Physical Exam  Constitutional: Patient appears well-developed and well-nourished.  No distress.  HEENT: head atraumatic, normocephalic, pupils equal and reactive to light, neck supple, oral exam showed multiple missing teeth, gum looks okay, we will [facility] Cardiovascular: Normal rate, regular rhythm and normal heart sounds.  No murmur heard. No BLE edema. Pulmonary/Chest: Effort normal and breath sounds normal. No respiratory distress. Abdominal: Soft.  There is no tenderness. Psychiatric: Patient has a normal mood and affect. behavior is normal. Judgment and thought content normal.  Recent Results  (from the past 2160 hour(s))  Valproic Acid level     Status: Abnormal   Collection Time: 02/20/20 10:53 AM  Result Value Ref Range   Valproic Acid Lvl 49.9 (L) 50.0 - 100.0 mg/L  B12 and Folate Panel     Status: Abnormal   Collection Time: 02/20/20 10:53 AM  Result Value Ref Range   Vitamin B-12 >2,000 (H) 200 - 1,100 pg/mL   Folate >24.0 ng/mL    Comment:                            Reference Range                            Low:           <3.4                            Borderline:    3.4-5.4                            Normal:        >5.4 .   COMPLETE METABOLIC PANEL WITH GFR     Status: None   Collection Time: 02/20/20 10:53 AM  Result Value Ref Range   Glucose, Bld 76 65 - 99 mg/dL    Comment: .            Fasting reference interval .    BUN 9 7 - 25 mg/dL   Creat 0.99 0.70 - 1.33 mg/dL    Comment: For patients >2 years of age, the reference limit for Creatinine is approximately 13% higher for people identified as African-American. .    GFR, Est Non African American 84 > OR = 60 mL/min/1.15m2   GFR, Est African American 97 > OR = 60 mL/min/1.52m2   BUN/Creatinine Ratio NOT APPLICABLE 6 - 22 (calc)   Sodium 139 135 - 146 mmol/L   Potassium 4.2 3.5 - 5.3 mmol/L   Chloride 102 98 - 110 mmol/L   CO2 31 20 - 32 mmol/L   Calcium 9.8 8.6 - 10.3 mg/dL   Total Protein 7.9 6.1 - 8.1 g/dL   Albumin 4.3 3.6 - 5.1 g/dL   Globulin 3.6 1.9 - 3.7 g/dL (calc)   AG Ratio 1.2 1.0 - 2.5 (calc)   Total Bilirubin 0.6 0.2 - 1.2 mg/dL   Alkaline phosphatase (APISO) 53 35 - 144 U/L   AST 21 10 - 35 U/L   ALT 12 9 - 46 U/L  CBC with Differential/Platelet     Status: None   Collection  Time: 02/20/20 10:53 AM  Result Value Ref Range   WBC 4.3 3.8 - 10.8 Thousand/uL   RBC 4.95 4.20 - 5.80 Million/uL   Hemoglobin 14.6 13.2 - 17.1 g/dL   HCT 47.4 25.9 - 56.3 %   MCV 88.7 80.0 - 100.0 fL   MCH 29.5 27.0 - 33.0 pg   MCHC 33.3 32.0 - 36.0 g/dL   RDW 87.5 64.3 - 32.9 %   Platelets 224 140  - 400 Thousand/uL   MPV 10.1 7.5 - 12.5 fL   Neutro Abs 1,879 1,500 - 7,800 cells/uL   Lymphs Abs 1,797 850 - 3,900 cells/uL   Absolute Monocytes 495 200 - 950 cells/uL   Eosinophils Absolute 99 15 - 500 cells/uL   Basophils Absolute 30 0 - 200 cells/uL   Neutrophils Relative % 43.7 %   Total Lymphocyte 41.8 %   Monocytes Relative 11.5 %   Eosinophils Relative 2.3 %   Basophils Relative 0.7 %  Hemoglobin A1c     Status: None   Collection Time: 02/20/20 10:53 AM  Result Value Ref Range   Hgb A1c MFr Bld 5.2 <5.7 % of total Hgb    Comment: For the purpose of screening for the presence of diabetes: . <5.7%       Consistent with the absence of diabetes 5.7-6.4%    Consistent with increased risk for diabetes             (prediabetes) > or =6.5%  Consistent with diabetes . This assay result is consistent with a decreased risk of diabetes. . Currently, no consensus exists regarding use of hemoglobin A1c for diagnosis of diabetes in children. . According to American Diabetes Association (ADA) guidelines, hemoglobin A1c <7.0% represents optimal control in non-pregnant diabetic patients. Different metrics may apply to specific patient populations.  Standards of Medical Care in Diabetes(ADA). .    Mean Plasma Glucose 103 (calc)   eAG (mmol/L) 5.7 (calc)      PHQ2/9: Depression screen [facility] 2/9 03/23/2020 07/24/2018 07/05/2018 06/20/2018 06/09/2017  Decreased Interest 0 0 0 0 0  Down, Depressed, Hopeless 0 0 0 0 0  PHQ - 2 Score 0 0 0 0 0  Altered sleeping 0 3 3 3  -  Tired, decreased energy 0 0 0 0 -  Change in appetite 0 3 3 3  -  Feeling bad or failure about yourself  0 0 0 0 -  Trouble concentrating 0 3 3 3  -  Moving slowly or fidgety/restless 0 3 3 3  -  Suicidal thoughts 0 0 0 0 -  PHQ-9 Score 0 12 12 12  -  Difficult doing work/chores Not difficult at all - - - -    phq 9 is he does not talk, just hums, not sure how accurate results it, given by his brother    Fall  Risk: Fall Risk  03/23/2020 02/20/2020 04/16/2019 03/14/2019 07/05/2018  Falls in the past year? 0 0 0 0 No  Number falls in past yr: 0 0 0 0 -  Injury with Fall? 0 0 0 0 -  Risk for fall due to : - - - - -  Risk for fall due to: Comment - - - - -  Follow up Falls evaluation completed - - Falls prevention discussed -     Assessment & Plan  1. Chewing difficulty  - Ambulatory referral to Dentistry  2. Poor dentition  - Ambulatory referral to Dentistry

## 2020-03-24 ENCOUNTER — Ambulatory Visit: Payer: Medicare Other | Attending: Internal Medicine

## 2020-03-24 DIAGNOSIS — Z23 Encounter for immunization: Secondary | ICD-10-CM

## 2020-03-24 NOTE — Progress Notes (Signed)
   Covid-19 Vaccination Clinic  Name:  Kamaron Deskins    MRN: [id number] DOB: 1961/07/31  03/24/2020  Mr. Pollio was observed post Covid-19 immunization for 15 minutes without incident. He was provided with Vaccine Information Sheet and instruction to access the V-Safe system.   Mr. Mould was instructed to call 911 with any severe reactions post vaccine: Marland Kitchen Difficulty breathing  . Swelling of face and throat  . A fast heartbeat  . A bad rash all over body  . Dizziness and weakness   Immunizations Administered    Name Date Dose VIS Date Route   Pfizer COVID-19 Vaccine 03/24/2020  1:01 PM 0.3 mL 12/25/2018 Intramuscular   Manufacturer: [employer], [employer]   Lot: [id number]   NDC: [address]-9

## 2020-04-14 ENCOUNTER — Ambulatory Visit: Payer: Medicare Other

## 2020-04-15 DIAGNOSIS — F99 Mental disorder, not otherwise specified: Secondary | ICD-10-CM | POA: Diagnosis not present

## 2020-04-15 DIAGNOSIS — F79 Unspecified intellectual disabilities: Secondary | ICD-10-CM | POA: Diagnosis not present

## 2020-04-15 DIAGNOSIS — R4701 Aphasia: Secondary | ICD-10-CM | POA: Diagnosis not present

## 2020-04-15 DIAGNOSIS — F5105 Insomnia due to other mental disorder: Secondary | ICD-10-CM | POA: Diagnosis not present

## 2020-04-15 DIAGNOSIS — Z79899 Other long term (current) drug therapy: Secondary | ICD-10-CM | POA: Diagnosis not present

## 2020-04-17 ENCOUNTER — Ambulatory Visit: Payer: Medicare Other | Attending: Internal Medicine

## 2020-04-17 DIAGNOSIS — Z23 Encounter for immunization: Secondary | ICD-10-CM

## 2020-04-17 NOTE — Progress Notes (Signed)
   Covid-19 Vaccination Clinic  Name:  Nyzir Dubois    MRN: [id number] DOB: 18-Apr-1961  04/17/2020  Mr. Litaker was observed post Covid-19 immunization for 15 minutes without incident. He was provided with Vaccine Information Sheet and instruction to access the V-Safe system.   Mr. Colver was instructed to call 911 with any severe reactions post vaccine: Marland Kitchen Difficulty breathing  . Swelling of face and throat  . A fast heartbeat  . A bad rash all over body  . Dizziness and weakness   Immunizations Administered    Name Date Dose VIS Date Route   Pfizer COVID-19 Vaccine 04/17/2020 10:10 AM 0.3 mL 12/25/2018 Intramuscular   Manufacturer: [employer], [employer]   Lot: [id number]   NDC: [address]-9

## 2020-05-05 ENCOUNTER — Ambulatory Visit (INDEPENDENT_AMBULATORY_CARE_PROVIDER_SITE_OTHER): Payer: Medicare Other | Admitting: Family Medicine

## 2020-05-05 ENCOUNTER — Other Ambulatory Visit: Payer: Self-pay

## 2020-05-05 ENCOUNTER — Ambulatory Visit: Payer: Medicare Other | Admitting: Family Medicine

## 2020-05-05 ENCOUNTER — Encounter: Payer: Self-pay | Admitting: Family Medicine

## 2020-05-05 VITALS — BP 128/84 | HR 66 | Temp 98.5°F | Resp 18 | Ht 69.0 in | Wt 151.5 lb

## 2020-05-05 DIAGNOSIS — R351 Nocturia: Secondary | ICD-10-CM

## 2020-05-05 DIAGNOSIS — R631 Polydipsia: Secondary | ICD-10-CM | POA: Diagnosis not present

## 2020-05-05 DIAGNOSIS — R350 Frequency of micturition: Secondary | ICD-10-CM

## 2020-05-05 LAB — PSA: PSA: 2 ng/mL (ref <=4.0)

## 2020-05-05 NOTE — Progress Notes (Signed)
Name: Erik Henderson   MRN: [id number]    DOB: Mar 21, 1961   Date:05/05/2020       Progress Note  Subjective  Chief Complaint  Chief Complaint  Patient presents with  . Referral    MRI Pituitary Gland  . Prostate    HPI  Sister [name] brought him in today. She is worried about his excessive thirst and also going to the bathroom frequently. It has been going on for a while. We have checked labs and normal electrolytes. He also had Urinalysis at [facility] and normal osmolality. We will check for prostrate enlargement but explained that MRI pituitary is not necessary .    Patient Active Problem List   Diagnosis Date Noted  . 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 GERD (gastroesophageal reflux disease) 05/29/2019  . 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 Seizure (HCC) 04/16/2019  . Behavior concern in adult 09/22/2018  . Behavioral change 06/27/2018  . Intellectual disability 06/27/2018  . Hyperglycemia 08/17/2015  . Anxiety 04/14/2015  . Cognitive impairment 04/14/2015  . Autism spectrum disorder 02/23/2015  . Hay fever 02/21/2008    Past Surgical History:  Procedure Laterality Date  . NO PAST SURGERIES      Family History  Problem Relation Age of Onset  . Hypertension Mother   . Hyperlipidemia Mother   . Diabetes Mother   . Cancer Mother        stomach  . Coronary artery disease Father   . Gout Father   . Heart failure Father   . Cancer Father        prostate  . Hypertension Brother   . Autism Brother   . Mental retardation Brother     Social History   Tobacco Use  . Smoking status: Never Smoker  . Smokeless tobacco: Never Used  . Tobacco comment: smoking cessation materials not required  Substance Use Topics  . Alcohol use: No    Alcohol/week: 0.0 standard drinks     Current Outpatient Medications:  .  clonazePAM (KLONOPIN) 0.5 MG tablet, Take 0.5 mg (one tablet) three times a day., Disp: , Rfl:  .  DEPAKOTE 250 MG DR tablet, TAKE ONE TABLET BY MOUTH DAILY AT  BREAKFAST THEN TAKE ONE TABLET AT LUNCH AND TAKE TWO TABLETS BY MOUTH AT BEDTIME, Disp: 120 tablet, Rfl: 0 .  GNP MELATONIN 3 MG TABS, Take 1 tablet by mouth every evening., Disp: , Rfl:  .  LORazepam (ATIVAN) 1 MG tablet, Take one tablet by mouth twice daily, Disp: , Rfl:  .  clonazePAM (KLONOPIN) 0.5 MG tablet, , Disp: , Rfl:   Allergies  Allergen Reactions  . Pollen Extract     Other reaction(s): Other (See Comments) Patent allergic to dust, that causes pt to sneeze.    I personally reviewed active problem list, medication list, allergies, family history, social history, health maintenance with the patient/caregiver today.   ROS  Ten systems reviewed and is negative except as mentioned in HPI Per sister still has periods of agitation, gets up at night to void, drinking too much water and when given restrictions he tries to drink other liquids l  Objective  There were no vitals filed for this visit.  There is no height or weight on file to calculate BMI.  Physical Exam  Constitutional: Patient appears well-developed No distress. He has mutism, he rocks on his chair, looks sleepy today  HEENT: head atraumatic, normocephalic, pupils equal and reactive to light Cardiovascular: Normal rate, regular rhythm and  normal heart sounds.  No murmur heard. No BLE edema. Pulmonary/Chest: Effort normal and breath sounds normal. No respiratory distress. Abdominal: Soft.  There is no tenderness. Rectal: normal exam, no masses, prostate normal in size Psychiatric: seems more sleepy than usual, sister states he stays up at night and sleeps during the day, but we moved to an earlier appointment , he came in during his nap time   Recent Results (from the past 2160 hour(s))  Valproic Acid level     Status: Abnormal   Collection Time: 02/20/20 10:53 AM  Result Value Ref Range   Valproic Acid Lvl 49.9 (L) 50.0 - 100.0 mg/L  B12 and Folate Panel     Status: Abnormal   Collection Time: 02/20/20  10:53 AM  Result Value Ref Range   Vitamin B-12 >2,000 (H) 200 - 1,100 pg/mL   Folate >24.0 ng/mL    Comment:                            Reference Range                            Low:           <3.4                            Borderline:    3.4-5.4                            Normal:        >5.4 .   COMPLETE METABOLIC PANEL WITH GFR     Status: None   Collection Time: 02/20/20 10:53 AM  Result Value Ref Range   Glucose, Bld 76 65 - 99 mg/dL    Comment: .            Fasting reference interval .    BUN 9 7 - 25 mg/dL   Creat 9.67 8.93 - 8.10 mg/dL    Comment: For patients >26 years of age, the reference limit for Creatinine is approximately 13% higher for people identified as African-American. .    GFR, Est Non African American 84 > OR = 60 mL/min/1.105m2   GFR, Est African American 97 > OR = 60 mL/min/1.66m2   BUN/Creatinine Ratio NOT APPLICABLE 6 - 22 (calc)   Sodium 139 135 - 146 mmol/L   Potassium 4.2 3.5 - 5.3 mmol/L   Chloride 102 98 - 110 mmol/L   CO2 31 20 - 32 mmol/L   Calcium 9.8 8.6 - 10.3 mg/dL   Total Protein 7.9 6.1 - 8.1 g/dL   Albumin 4.3 3.6 - 5.1 g/dL   Globulin 3.6 1.9 - 3.7 g/dL (calc)   AG Ratio 1.2 1.0 - 2.5 (calc)   Total Bilirubin 0.6 0.2 - 1.2 mg/dL   Alkaline phosphatase (APISO) 53 35 - 144 U/L   AST 21 10 - 35 U/L   ALT 12 9 - 46 U/L  CBC with Differential/Platelet     Status: None   Collection Time: 02/20/20 10:53 AM  Result Value Ref Range   WBC 4.3 3.8 - 10.8 Thousand/uL   RBC 4.95 4.20 - 5.80 Million/uL   Hemoglobin 14.6 13.2 - 17.1 g/dL   HCT 17.5 38 - 50 %   MCV 88.7 80.0 - 100.0 fL   MCH 29.5 27.0 -  33.0 pg   MCHC 33.3 32.0 - 36.0 g/dL   RDW 37.8 58.8 - 50.2 %   Platelets 224 140 - 400 Thousand/uL   MPV 10.1 7.5 - 12.5 fL   Neutro Abs 1,879 1,500 - 7,800 cells/uL   Lymphs Abs 1,797 850 - 3,900 cells/uL   Absolute Monocytes 495 200 - 950 cells/uL   Eosinophils Absolute 99 15 - 500 cells/uL   Basophils Absolute 30 0 - 200 cells/uL    Neutrophils Relative % 43.7 %   Total Lymphocyte 41.8 %   Monocytes Relative 11.5 %   Eosinophils Relative 2.3 %   Basophils Relative 0.7 %  Hemoglobin A1c     Status: None   Collection Time: 02/20/20 10:53 AM  Result Value Ref Range   Hgb A1c MFr Bld 5.2 <5.7 % of total Hgb    Comment: For the purpose of screening for the presence of diabetes: . <5.7%       Consistent with the absence of diabetes 5.7-6.4%    Consistent with increased risk for diabetes             (prediabetes) > or =6.5%  Consistent with diabetes . This assay result is consistent with a decreased risk of diabetes. . Currently, no consensus exists regarding use of hemoglobin A1c for diagnosis of diabetes in children. . According to American Diabetes Association (ADA) guidelines, hemoglobin A1c <7.0% represents optimal control in non-pregnant diabetic patients. Different metrics may apply to specific patient populations.  Standards of Medical Care in Diabetes(ADA). .    Mean Plasma Glucose 103 (calc)   eAG (mmol/L) 5.7 (calc)      PHQ2/9: Depression screen [facility] 2/9 03/23/2020 07/24/2018 07/05/2018 06/20/2018 06/09/2017  Decreased Interest 0 0 0 0 0  Down, Depressed, Hopeless 0 0 0 0 0  PHQ - 2 Score 0 0 0 0 0  Altered sleeping 0 3 3 3  -  Tired, decreased energy 0 0 0 0 -  Change in appetite 0 3 3 3  -  Feeling bad or failure about yourself  0 0 0 0 -  Trouble concentrating 0 3 3 3  -  Moving slowly or fidgety/restless 0 3 3 3  -  Suicidal thoughts 0 0 0 0 -  PHQ-9 Score 0 12 12 12  -  Difficult doing work/chores Not difficult at all - - - -    phq 9 is negative   Fall Risk: Fall Risk  03/23/2020 02/20/2020 04/16/2019 03/14/2019 07/05/2018  Falls in the past year? 0 0 0 0 No  Number falls in past yr: 0 0 0 0 -  Injury with Fall? 0 0 0 0 -  Risk for fall due to : - - - - -  Risk for fall due to: Comment - - - - -  Follow up Falls evaluation completed - - Falls prevention discussed -      Assessment &  Plan  1. Nocturia  - PSA - Urine Culture - Urinalysis, microscopic only  2. Urinary frequency  - PSA - Urine Culture - Urinalysis, microscopic only  3. Polydipsia  Labs reviewed, it may be psychogenic

## 2020-05-06 LAB — URINE CULTURE
MICRO NUMBER:: 10671527
SPECIMEN QUALITY:: ADEQUATE

## 2020-07-08 DIAGNOSIS — Z79899 Other long term (current) drug therapy: Secondary | ICD-10-CM | POA: Diagnosis not present

## 2020-07-08 DIAGNOSIS — F840 Autistic disorder: Secondary | ICD-10-CM | POA: Diagnosis not present

## 2020-07-08 DIAGNOSIS — F79 Unspecified intellectual disabilities: Secondary | ICD-10-CM | POA: Diagnosis not present

## 2020-07-08 DIAGNOSIS — F5105 Insomnia due to other mental disorder: Secondary | ICD-10-CM | POA: Diagnosis not present

## 2020-07-08 DIAGNOSIS — F99 Mental disorder, not otherwise specified: Secondary | ICD-10-CM | POA: Diagnosis not present

## 2020-09-07 ENCOUNTER — Ambulatory Visit: Payer: Medicare Other

## 2020-09-14 ENCOUNTER — Other Ambulatory Visit: Payer: Self-pay

## 2020-09-14 ENCOUNTER — Ambulatory Visit (INDEPENDENT_AMBULATORY_CARE_PROVIDER_SITE_OTHER): Payer: Medicare Other

## 2020-09-14 DIAGNOSIS — Z23 Encounter for immunization: Secondary | ICD-10-CM | POA: Diagnosis not present

## 2020-09-23 ENCOUNTER — Ambulatory Visit: Payer: Medicare Other | Admitting: Family Medicine

## 2020-09-30 DIAGNOSIS — F79 Unspecified intellectual disabilities: Secondary | ICD-10-CM | POA: Diagnosis not present

## 2020-09-30 DIAGNOSIS — F99 Mental disorder, not otherwise specified: Secondary | ICD-10-CM | POA: Diagnosis not present

## 2020-09-30 DIAGNOSIS — F69 Unspecified disorder of adult personality and behavior: Secondary | ICD-10-CM | POA: Diagnosis not present

## 2020-09-30 DIAGNOSIS — Z79899 Other long term (current) drug therapy: Secondary | ICD-10-CM | POA: Diagnosis not present

## 2020-09-30 DIAGNOSIS — F840 Autistic disorder: Secondary | ICD-10-CM | POA: Diagnosis not present

## 2020-09-30 DIAGNOSIS — F5105 Insomnia due to other mental disorder: Secondary | ICD-10-CM | POA: Diagnosis not present

## 2020-10-21 ENCOUNTER — Ambulatory Visit: Payer: Medicare Other | Admitting: Family Medicine

## 2020-10-29 ENCOUNTER — Ambulatory Visit (INDEPENDENT_AMBULATORY_CARE_PROVIDER_SITE_OTHER): Payer: Medicare Other

## 2020-10-29 DIAGNOSIS — Z0000 Encounter for general adult medical examination without abnormal findings: Secondary | ICD-10-CM

## 2020-10-29 NOTE — Progress Notes (Addendum)
Subjective:   Erik Henderson is a 59 y.o. male who presents for Medicare Annual/Subsequent preventive examination.  Virtual Visit via Telephone Note  I connected with  Erik Henderson on 10/29/20 at  8:40 AM EST by telephone and verified that I am speaking with the correct person using two identifiers.  Location: Patient: home Provider: [facility] Persons participating in the virtual visit: patient & sister Erik Henderson/ Nurse Health Advisor   I discussed the limitations, risks, security and privacy concerns of performing an evaluation and management service by telephone and the availability of in person appointments. The patient expressed understanding and agreed to proceed.  Interactive audio and video telecommunications were attempted between this nurse and patient, however failed, due to patient having technical difficulties OR patient did not have access to video capability.  We continued and completed visit with audio only.  Some vital signs may be absent or patient reported.   Reather Littler, LPN    Review of Systems     Cardiac Risk Factors include: advanced age (>79men, >1 women);male gender     Objective:    There were no vitals filed for this visit. There is no height or weight on file to calculate BMI.  Advanced Directives 10/29/2020 03/14/2019 03/09/2018 08/07/2017 11/15/2016 09/06/2016 06/06/2016  Does Patient Have a Medical Advance Directive? Yes Yes Yes No Yes Yes Yes  Type of Social research officer, government Power of Attorney - Healthcare Power of Attorney Living will;Healthcare Power of [employer] Power of Attorney  Does patient want to make changes to medical advance directive? - - - - - No - Patient declined No - Patient declined  Copy of Healthcare Power of Attorney in Chart? No - copy requested No - copy requested No - copy requested - - No - copy requested Yes    Current Medications (verified) Outpatient Encounter  Medications as of 10/29/2020  Medication Sig   clonazePAM (KLONOPIN) 0.5 MG tablet Take 0.5 mg (one tablet) three times a day.   DEPAKOTE 250 MG DR tablet TAKE ONE TABLET BY MOUTH DAILY AT BREAKFAST THEN TAKE ONE TABLET AT LUNCH AND TAKE TWO TABLETS BY MOUTH AT BEDTIME   escitalopram (LEXAPRO) 10 MG tablet Take 1 tablet by mouth daily.   GNP MELATONIN 3 MG TABS Take 1 tablet by mouth every evening.   LORazepam (ATIVAN) 1 MG tablet Take one tablet by mouth twice daily   No facility-administered encounter medications on file as of 10/29/2020.    Allergies (verified) Pollen extract   History: Past Medical History:  Diagnosis Date   Allergy    Anxiety    Autism    Autistic disorder    Mental disorder    Mental retardation    Past Surgical History:  Procedure Laterality Date   NO PAST SURGERIES     Family History  Problem Relation Age of Onset   Hypertension Mother    Hyperlipidemia Mother    Diabetes Mother    Cancer Mother        stomach   Coronary artery disease Father    Gout Father    Heart failure Father    Cancer Father        prostate   Hypertension Brother    Autism Brother    Mental retardation Brother    Social History   Socioeconomic History   Marital status: Single    Spouse name: Not on file   Number of children: 0  Years of education: Not on file   Highest education level: Never attended school  Occupational History   Occupation: disabled  Tobacco Use   Smoking status: Never Smoker   Smokeless tobacco: Never Used   Tobacco comment: smoking cessation materials not required  Vaping Use   Vaping Use: Never used  Substance and Sexual Activity   Alcohol use: No    Alcohol/week: 0.0 standard drinks   Drug use: No   Sexual activity: Never  Other Topics Concern   Not on file  Social History Narrative   Lives with mother, brothers and sisters; sisters are caregivers; mother is guardian/POA -- family discussing  transfer of guardianship/POA to sister as parents in poor health   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                   Social Determinants of Health   Financial Resource Strain: Low Risk    Difficulty of Paying Living Expenses: Not very hard  Food Insecurity: No Food Insecurity   Worried About Programme researcher, broadcasting/film/video in the Last Year: Never true   Ran Out of Food in the Last Year: Never true  Transportation Needs: No Transportation Needs   Lack of Transportation (Medical): No   Lack of Transportation (Non-Medical): No  Physical Activity: Inactive   Days of Exercise per Week: 0 days   Minutes of Exercise per Session: 0 min  Stress: Stress Concern Present   Feeling of Stress : Rather much  Social Connections: Socially Isolated   Frequency of Communication with Friends and Family: Never   Frequency of Social Gatherings with Friends and Family: Never   Attends Religious Services: Never   Diplomatic Services operational officer: No   Attends Engineer, structural: Never   Marital Status: Never married    Tobacco Counseling Counseling given: Not Answered Comment: smoking cessation materials not required   Clinical Intake:  Pre-visit preparation completed: Yes  Pain : No/denies pain     Nutritional Risks: None Diabetes: No  How often do you need to have someone help you when you read instructions, pamphlets, or other written materials from your doctor or pharmacy?: 5 - Always    Interpreter Needed?: No  Information entered by :: Reather Littler LPN   Activities of Daily Living In your present state of health, do you have any difficulty performing the following activities: 10/29/2020  Hearing? N  Comment declines hearing aids  Vision? Y  Difficulty concentrating or making decisions? Y  Walking or climbing stairs? N  Dressing or bathing? Y  Doing errands, shopping? Y  Preparing Food and eating ? Y  Using the  Toilet? N  In the past six months, have you accidently leaked urine? N  Do you have problems with loss of bowel control? N  Managing your Medications? Y  Managing your Finances? Y  Housekeeping or managing your Housekeeping? Y  Some recent data might be hidden    Patient Care Team: Alba Cory, MD as PCP - General (Family Medicine) Dennison Bulla, MD as Consulting Physician (Psychiatry)  Indicate any recent Medical Services you may have received from other than [facility] providers in the past year (date may be approximate).     Assessment:   This is a routine wellness examination for Idris.  Hearing/Vision screen  Hearing Screening   125Hz  250Hz  500Hz  1000Hz  2000Hz  3000Hz  4000Hz  6000Hz  8000Hz   Right ear:  Left ear:           Comments: Pt denies hearing difficulty  Vision Screening Comments: Pt is past due for eye exam  Dietary issues and exercise activities discussed: Current Exercise Habits: The patient does not participate in regular exercise at present, Exercise limited by: psychological condition(s)  Goals      "We need all the help we can get with him. He has behavior problems right now." (pt-stated)      Family caregiver reports worsening obsession with water consumption, decreased food intake and related weight loss and anemia. Caregiver reports that she has spoken with psychiatrist who advises that patient's Lorazepam "might be" increased when he is seen in the office on 10/16. She is to call the neurology office tomorrow to reschedule a missed new patient appointment.   Clinical Goals: Over the next 7 days, patient's sister/caregiver will report receipt of neuropsychology appointment.  Interventions: Provided direction re: reaching out to neurology provider to reschedule appointment; offered support if difficulty encountered with scheduling appointment with specialty provider office; updated primary care provider       "We need to sort out guardianship  and power of attorney" (pt-stated)      Patient's family/caregiver indicates that mother holds guardianship/POA currently but wishes to transfer guardianship/POA to daughter who is primary caregiver.   Clinical Goals: Over the next 30 days, patient's family/caregivers will complete required documentation for changes in guardianship/POA.   Interventions: Provided sister with information r/t changes in guardianship/POA. Offered to assist with obtaining needed documentation.        DIET - INCREASE WATER INTAKE      Recommend to drink at least 6-8 8oz glasses of water per day.      Depression Screen PHQ 2/9 Scores 10/29/2020 05/05/2020 03/23/2020 02/20/2020 04/16/2019 07/24/2018 07/05/2018  PHQ - 2 Score - 0 0 - - 0 0  PHQ- 9 Score - 0 0 - - 12 12  Exception Documentation Medical reason - - Medical reason Other- indicate reason in comment box Other- indicate reason in comment box -  Not completed - - - - Cognitive Impairment - -    Fall Risk Fall Risk  10/29/2020 05/05/2020 03/23/2020 02/20/2020 04/16/2019  Falls in the past year? 0 0 0 0 0  Number falls in past yr: 0 0 0 0 0  Injury with Fall? 0 0 0 0 0  Risk for fall due to : No Fall Risks - - - -  Risk for fall due to: Comment - - - - -  Follow up Falls prevention discussed - Falls evaluation completed - -    FALL RISK PREVENTION PERTAINING TO THE HOME:  Any stairs in or around the home? Yes  If so, are there any without handrails? No  Home free of loose throw rugs in walkways, pet beds, electrical cords, etc? Yes  Adequate lighting in your home to reduce risk of falls? Yes   ASSISTIVE DEVICES UTILIZED TO PREVENT FALLS:  Life alert? No  Use of a cane, walker or w/c? No  Grab bars in the bathroom? Yes  Shower chair or bench in shower? Yes  Elevated toilet seat or a handicapped toilet? Yes   TIMED UP AND GO:  Was the test performed? No . Telephonic visit.   Cognitive Function: Cognitive status assessed by direct observation. Patient  has current diagnosis of cognitive impairment. Patient is followed by psychiatry for ongoing assessment. Patient is unable to complete screening 6CIT or MMSE.  Immunizations Immunization History  Administered Date(s) Administered   Influenza, Seasonal, Injecte, Preservative Fre 10/10/2011, 10/11/2012   Influenza,inj,Quad PF,6+ Mos 11/04/2013, 08/05/2014, 08/17/2015, 09/06/2016, 08/07/2017, 06/20/2018, 09/20/2019, 09/14/2020   PFIZER SARS-COV-2 Vaccination 03/24/2020, 04/17/2020   Pneumococcal Polysaccharide-23 05/10/2012   Tdap 05/10/2011    TDAP status: Up to date  Flu Vaccine status: Up to date  Pneumococcal vaccine status: Up to date  Covid-19 vaccine status: Completed vaccines  Qualifies for Shingles Vaccine? Yes   Zostavax completed No   Shingrix Completed?: No.    Education has been provided regarding the importance of this vaccine. Patient has been advised to call insurance company to determine out of pocket expense if they have not yet received this vaccine. Advised may also receive vaccine at local pharmacy or Health Dept. Verbalized acceptance and understanding.  Screening Tests Health Maintenance  Topic Date Due   COLONOSCOPY (Pts 45-980yrs Insurance coverage will need to be confirmed)  Never done   COVID-19 Vaccine (3 - Booster for Pfizer series) 10/17/2020   HIV Screening  08/10/2029 (Originally 05/22/1976)   TETANUS/TDAP  05/09/2021   INFLUENZA VACCINE  Completed   Hepatitis C Screening  Completed    Health Maintenance  Health Maintenance Due  Topic Date Due   COLONOSCOPY (Pts [id number] Insurance coverage will need to be confirmed)  Never done   COVID-19 Vaccine (3 - Booster for Pfizer series) 10/17/2020   Colorectal cancer screening: pt has been unable to complete due to prep and understanding of procedure. Pt's family has previously discussed this with Dr. Carlynn PurlSowles.   Lung Cancer Screening: (Low Dose CT Chest recommended if Age 37-80  years, 30 pack-year currently smoking OR have quit w/in 15years.) does not qualify.   Additional Screening:  Hepatitis C Screening: does qualify; Completed 06/06/16  Vision Screening: Recommended annual ophthalmology exams for early detection of glaucoma and other disorders of the eye. Is the patient up to date with their annual eye exam?  No  Who is the provider or what is the name of the office in which the patient attends annual eye exams? Not established If pt is not established with a provider, would they like to be referred to a provider to establish care? No .   Dental Screening: Recommended annual dental exams for proper oral hygiene  Community Resource Referral / Chronic Care Management: CRR required this visit?  No   CCM required this visit?  No      Plan:     I have personally reviewed and noted the following in the patients chart:    Medical and social history  Use of alcohol, tobacco or illicit drugs   Current medications and supplements  Functional ability and status  Nutritional status  Physical activity  Advanced directives  List of other physicians  Hospitalizations, surgeries, and ER visits in previous 12 months  Vitals  Screenings to include cognitive, depression, and falls  Referrals and appointments  In addition, I have reviewed and discussed with patient certain preventive protocols, quality metrics, and best practice recommendations. A written personalized care plan for preventive services as well as general preventive health recommendations were provided to patient.     Reather LittlerKasey Kamirah Shugrue, LPN   04/54/098112/30/2021   Nurse Notes: pt accompanied during visit by his sister Amada JupiterDale. She states pt has not been sleeping. [facility] management and they are working to get brand depakote approved but he is currently taking the generic.

## 2020-10-29 NOTE — Patient Instructions (Signed)
Erik Henderson , Thank you for taking time to come for your Medicare Wellness Visit. I appreciate your ongoing commitment to your health goals. Please review the following plan we discussed and let me know if I can assist you in the future.   Screening recommendations/referrals: Colonoscopy: postponed Recommended yearly ophthalmology/optometry visit for glaucoma screening and checkup Recommended yearly dental visit for hygiene and checkup  Vaccinations: Influenza vaccine: done 09/14/20 Pneumococcal vaccine: done 05/10/12 Tdap vaccine: done 05/10/11 Shingles vaccine: Shingrix discussed. Please contact your pharmacy for coverage information.  Covid-19: done 03/24/20 & 04/17/20  Advanced directives: Please bring a copy of your health care power of attorney and living will to the office at your convenience.  Conditions/risks identified: Recommend physical activity 3 days per week  Next appointment: Follow up in one year for your annual wellness visit   Preventive Care 40-64 Years, Male Preventive care refers to lifestyle choices and visits with your health care provider that can promote health and wellness. What does preventive care include?  A yearly physical exam. This is also called an annual well check.  Dental exams once or twice a year.  Routine eye exams. Ask your health care provider how often you should have your eyes checked.  Personal lifestyle choices, including:  Daily care of your teeth and gums.  Regular physical activity.  Eating a healthy diet.  Avoiding tobacco and drug use.  Limiting alcohol use.  Practicing safe sex.  Taking low-dose aspirin every day starting at age 50. What happens during an annual well check? The services and screenings done by your health care provider during your annual well check will depend on your age, overall health, lifestyle risk factors, and family history of disease. Counseling  Your health care provider may ask you questions  about your:  Alcohol use.  Tobacco use.  Drug use.  Emotional well-being.  Home and relationship well-being.  Sexual activity.  Eating habits.  Work and work Astronomer. Screening  You may have the following tests or measurements:  Height, weight, and BMI.  Blood pressure.  Lipid and cholesterol levels. These may be checked every 5 years, or more frequently if you are over 74 years old.  Skin check.  Lung cancer screening. You may have this screening every year starting at age 22 if you have a 30-pack-year history of smoking and currently smoke or have quit within the past 15 years.  Fecal occult blood test (FOBT) of the stool. You may have this test every year starting at age 62.  Flexible sigmoidoscopy or colonoscopy. You may have a sigmoidoscopy every 5 years or a colonoscopy every 10 years starting at age 15.  Prostate cancer screening. Recommendations will vary depending on your family history and other risks.  Hepatitis C blood test.  Hepatitis B blood test.  Sexually transmitted disease (STD) testing.  Diabetes screening. This is done by checking your blood sugar (glucose) after you have not eaten for a while (fasting). You may have this done every 1-3 years. Discuss your test results, treatment options, and if necessary, the need for more tests with your health care provider. Vaccines  Your health care provider may recommend certain vaccines, such as:  Influenza vaccine. This is recommended every year.  Tetanus, diphtheria, and acellular pertussis (Tdap, Td) vaccine. You may need a Td booster every 10 years.  Zoster vaccine. You may need this after age 73.  Pneumococcal 13-valent conjugate (PCV13) vaccine. You may need this if you have certain conditions and have  not been vaccinated.  Pneumococcal polysaccharide (PPSV23) vaccine. You may need one or two doses if you smoke cigarettes or if you have certain conditions. Talk to your health care provider  about which screenings and vaccines you need and how often you need them. This information is not intended to replace advice given to you by your health care provider. Make sure you discuss any questions you have with your health care provider. Document Released: 11/13/2015 Document Revised: 07/06/2016 Document Reviewed: 08/18/2015 Elsevier Interactive Patient Education  2017 [employer] Prevention in the Home Falls can cause injuries. They can happen to people of all ages. There are many things you can do to make your home safe and to help prevent falls. What can I do on the outside of my home?  Regularly fix the edges of walkways and driveways and fix any cracks.  Remove anything that might make you trip as you walk through a door, such as a raised step or threshold.  Trim any bushes or trees on the path to your home.  Use bright outdoor lighting.  Clear any walking paths of anything that might make someone trip, such as rocks or tools.  Regularly check to see if handrails are loose or broken. Make sure that both sides of any steps have handrails.  Any raised decks and porches should have guardrails on the edges.  Have any leaves, snow, or ice cleared regularly.  Use sand or salt on walking paths during winter.  Clean up any spills in your garage right away. This includes oil or grease spills. What can I do in the bathroom?  Use night lights.  Install grab bars by the toilet and in the tub and shower. Do not use towel bars as grab bars.  Use non-skid mats or decals in the tub or shower.  If you need to sit down in the shower, use a plastic, non-slip stool.  Keep the floor dry. Clean up any water that spills on the floor as soon as it happens.  Remove soap buildup in the tub or shower regularly.  Attach bath mats securely with double-sided non-slip rug tape.  Do not have throw rugs and other things on the floor that can make you trip. What can I do in the  bedroom?  Use night lights.  Make sure that you have a light by your bed that is easy to reach.  Do not use any sheets or blankets that are too big for your bed. They should not hang down onto the floor.  Have a firm chair that has side arms. You can use this for support while you get dressed.  Do not have throw rugs and other things on the floor that can make you trip. What can I do in the kitchen?  Clean up any spills right away.  Avoid walking on wet floors.  Keep items that you use a lot in easy-to-reach places.  If you need to reach something above you, use a strong step stool that has a grab bar.  Keep electrical cords out of the way.  Do not use floor polish or wax that makes floors slippery. If you must use wax, use non-skid floor wax.  Do not have throw rugs and other things on the floor that can make you trip. What can I do with my stairs?  Do not leave any items on the stairs.  Make sure that there are handrails on both sides of the stairs and use  them. Fix handrails that are broken or loose. Make sure that handrails are as long as the stairways.  Check any carpeting to make sure that it is firmly attached to the stairs. Fix any carpet that is loose or worn.  Avoid having throw rugs at the top or bottom of the stairs. If you do have throw rugs, attach them to the floor with carpet tape.  Make sure that you have a light switch at the top of the stairs and the bottom of the stairs. If you do not have them, ask someone to add them for you. What else can I do to help prevent falls?  Wear shoes that:  Do not have high heels.  Have rubber bottoms.  Are comfortable and fit you well.  Are closed at the toe. Do not wear sandals.  If you use a stepladder:  Make sure that it is fully opened. Do not climb a closed stepladder.  Make sure that both sides of the stepladder are locked into place.  Ask someone to hold it for you, if possible.  Clearly mark and make  sure that you can see:  Any grab bars or handrails.  First and last steps.  Where the edge of each step is.  Use tools that help you move around (mobility aids) if they are needed. These include:  Canes.  Walkers.  Scooters.  Crutches.  Turn on the lights when you go into a dark area. Replace any light bulbs as soon as they burn out.  Set up your furniture so you have a clear path. Avoid moving your furniture around.  If any of your floors are uneven, fix them.  If there are any pets around you, be aware of where they are.  Review your medicines with your doctor. Some medicines can make you feel dizzy. This can increase your chance of falling. Ask your doctor what other things that you can do to help prevent falls. This information is not intended to replace advice given to you by your health care provider. Make sure you discuss any questions you have with your health care provider. Document Released: 08/13/2009 Document Revised: 03/24/2016 Document Reviewed: 11/21/2014 Elsevier Interactive Patient Education  2017 [employer].

## 2020-11-20 ENCOUNTER — Ambulatory Visit: Payer: Medicare Other

## 2020-12-18 ENCOUNTER — Ambulatory Visit: Payer: Medicare Other | Attending: Internal Medicine

## 2020-12-18 ENCOUNTER — Other Ambulatory Visit (HOSPITAL_COMMUNITY): Payer: Self-pay | Admitting: Internal Medicine

## 2020-12-18 DIAGNOSIS — Z23 Encounter for immunization: Secondary | ICD-10-CM

## 2020-12-18 NOTE — Progress Notes (Signed)
   Covid-19 Vaccination Clinic  Name:  Erik Henderson    MRN: [id number] DOB: 11-Feb-1961  12/18/2020  [name] was observed post Covid-19 immunization for 15 minutes without incident. He was provided with Vaccine Information Sheet and instruction to access the V-Safe system.   [name] was instructed to call 911 with any severe reactions post vaccine: Marland Kitchen Difficulty breathing  . Swelling of face and throat  . A fast heartbeat  . A bad rash all over body  . Dizziness and weakness   Immunizations Administered    Name Date Dose VIS Date Route   PFIZER Comrnaty(Gray TOP) Covid-19 Vaccine 12/18/2020 12:28 PM 0.3 mL 10/08/2020 Intramuscular   Manufacturer: [employer], [employer]   Lot: [id number]   NDC: [phone]

## 2021-01-07 DIAGNOSIS — Z79899 Other long term (current) drug therapy: Secondary | ICD-10-CM | POA: Diagnosis not present

## 2021-01-07 DIAGNOSIS — R55 Syncope and collapse: Secondary | ICD-10-CM | POA: Diagnosis not present

## 2021-01-07 DIAGNOSIS — R531 Weakness: Secondary | ICD-10-CM | POA: Diagnosis not present

## 2021-01-07 DIAGNOSIS — R451 Restlessness and agitation: Secondary | ICD-10-CM | POA: Diagnosis not present

## 2021-01-07 DIAGNOSIS — R6250 Unspecified lack of expected normal physiological development in childhood: Secondary | ICD-10-CM | POA: Diagnosis not present

## 2021-01-07 DIAGNOSIS — R509 Fever, unspecified: Secondary | ICD-10-CM | POA: Diagnosis not present

## 2021-01-07 DIAGNOSIS — R569 Unspecified convulsions: Secondary | ICD-10-CM | POA: Diagnosis not present

## 2021-01-07 DIAGNOSIS — Z20822 Contact with and (suspected) exposure to covid-19: Secondary | ICD-10-CM | POA: Diagnosis not present

## 2021-01-07 DIAGNOSIS — F840 Autistic disorder: Secondary | ICD-10-CM | POA: Diagnosis not present

## 2021-01-07 DIAGNOSIS — K219 Gastro-esophageal reflux disease without esophagitis: Secondary | ICD-10-CM | POA: Diagnosis not present

## 2021-01-08 DIAGNOSIS — R55 Syncope and collapse: Secondary | ICD-10-CM | POA: Diagnosis not present

## 2021-01-08 DIAGNOSIS — I361 Nonrheumatic tricuspid (valve) insufficiency: Secondary | ICD-10-CM | POA: Diagnosis not present

## 2021-01-18 NOTE — Progress Notes (Signed)
Name: Erik Henderson   MRN: [id number]    DOB: 08/25/1961   Date:01/19/2021       Progress Note  Subjective  Chief Complaint  ER Follow Up  HPI  [facility] Follow up: he had one episode of starring, diaphoresis, acted like he was in pain /mumbling. He did not vomit, he is non verbal and he had a gap in his Depakote. Out of medication - depakote for two days and with history of seizures that may have been the cause of his change in behavior [facility] says syncope but never had loss of consciousness and incontinence. He was kept in observation. He had elevated troponin. He was observed for 23 hours and sent home. Explained not sure why troponin was elevated, mother would like to have him evaluated by cardiologist.  His brother died 2020/11/29( caregiver) also lost his aunt two days later, lives with brother that also has autism, mother and sister.    Patient Active Problem List   Diagnosis Date Noted  . 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 GERD (gastroesophageal reflux disease) 05/29/2019  . 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 Seizure (HCC) 04/16/2019  . Behavior concern in adult 09/22/2018  . Behavioral change 06/27/2018  . Intellectual disability 06/27/2018  . Hyperglycemia 08/17/2015  . Anxiety 04/14/2015  . Cognitive impairment 04/14/2015  . Autism spectrum disorder 02/23/2015  . Hay fever 02/21/2008    Past Surgical History:  Procedure Laterality Date  . NO PAST SURGERIES      Family History  Problem Relation Age of Onset  . Hypertension Mother   . Hyperlipidemia Mother   . Diabetes Mother   . Cancer Mother        stomach  . Coronary artery disease Father   . Gout Father   . Heart failure Father   . Cancer Father        prostate  . Hypertension Brother   . Autism Brother   . Mental retardation Brother     Social History   Tobacco Use  . Smoking status: Never Smoker  . Smokeless tobacco: Never Used  . Tobacco comment: smoking cessation materials not required   Substance Use Topics  . Alcohol use: No    Alcohol/week: 0.0 standard drinks     Current Outpatient Medications:  .  clonazePAM (KLONOPIN) 0.5 MG tablet, Take 0.5 mg (one tablet) three times a day., Disp: , Rfl:  .  DEPAKOTE 250 MG DR tablet, TAKE ONE TABLET BY MOUTH DAILY AT BREAKFAST THEN TAKE ONE TABLET AT LUNCH AND TAKE TWO TABLETS BY MOUTH AT BEDTIME, Disp: 120 tablet, Rfl: 0 .  GNP MELATONIN 3 MG TABS, Take 1 tablet by mouth every evening., Disp: , Rfl:  .  LORazepam (ATIVAN) 1 MG tablet, Take one tablet by mouth twice daily, Disp: , Rfl:  .  escitalopram (LEXAPRO) 10 MG tablet, Take 1 tablet by mouth daily., Disp: , Rfl:   Allergies  Allergen Reactions  . Pollen Extract     Other reaction(s): Other (See Comments) Patent allergic to dust, that causes pt to sneeze.    I personally reviewed active problem list, medication list, allergies, family history, social history, health maintenance with the patient/caregiver today.   ROS  Constitutional: Negative for fever , positive for mild  weight change.  Respiratory: Negative for cough and shortness of breath.   Cardiovascular: Negative for chest pain or palpitations.  Gastrointestinal: Negative for abdominal pain, no bowel changes.  Musculoskeletal: Negative for gait  problem or joint swelling.  Skin: Negative for rash.  Neurological: Negative for dizziness or headache.  No other specific complaints in a complete review of systems (except as listed in HPI above). He is non verbal and ROS done with help of mother and sister   Objective  Vitals:   01/19/21 1032 01/19/21 1104  BP: 100/90 100/84  Pulse: 73   Resp: 16   Temp: 97.8 F (36.6 C)   TempSrc: Oral   SpO2: 97%   Weight: 148 lb (67.1 kg)   Height: 5\' 9"  (1.753 m)     Body mass index is 21.86 kg/m.  Physical Exam  Constitutional: Patient appears well-developed and well-nourished.  No distress.  HEENT: head atraumatic, normocephalic, pupils equal and  reactive to light, neck supple, throat within normal limits Cardiovascular: Normal rate, regular rhythm and normal heart sounds.  No murmur heard. No BLE edema. Pulmonary/Chest: Effort normal and breath sounds normal. No respiratory distress. Abdominal: Soft.  There is no tenderness. Psychiatric: non verbal, seemed sleepy. Cooperative    PHQ2/9: Depression screen [facility] 2/9 01/19/2021 05/05/2020 03/23/2020 07/24/2018 07/05/2018  Decreased Interest 0 0 0 0 0  Down, Depressed, Hopeless 0 0 0 0 0  PHQ - 2 Score 0 0 0 0 0  Altered sleeping - 0 0 3 3  Tired, decreased energy - 0 0 0 0  Change in appetite - 0 0 3 3  Feeling bad or failure about yourself  - 0 0 0 0  Trouble concentrating - 0 0 3 3  Moving slowly or fidgety/restless - 0 0 3 3  Suicidal thoughts - 0 0 0 0  PHQ-9 Score - 0 0 12 12  Difficult doing work/chores - - Not difficult at all - -    phq 9 is negative/but given by relatives    Fall Risk: Fall Risk  01/19/2021 10/29/2020 05/05/2020 03/23/2020 02/20/2020  Falls in the past year? 1 0 0 0 0  Number falls in past yr: 0 0 0 0 0  Injury with Fall? 0 0 0 0 0  Risk for fall due to : - No Fall Risks - - -  Risk for fall due to: Comment - - - - -  Follow up - Falls prevention discussed - Falls evaluation completed -    Functional Status Survey: Is the patient deaf or have difficulty hearing?: No Does the patient have difficulty seeing, even when wearing glasses/contacts?: No Does the patient have difficulty concentrating, remembering, or making decisions?: No Does the patient have difficulty walking or climbing stairs?: No Does the patient have difficulty dressing or bathing?: Yes Does the patient have difficulty doing errands alone such as visiting a doctor's office or shopping?: Yes    Assessment & Plan  1. Elevated troponin  - Ambulatory referral to Cardiology  2. Stress  - Ambulatory referral to Cardiology  3. Active autistic disorder   4. Seizure (HCC)

## 2021-01-19 ENCOUNTER — Other Ambulatory Visit: Payer: Self-pay

## 2021-01-19 ENCOUNTER — Encounter: Payer: Self-pay | Admitting: Family Medicine

## 2021-01-19 ENCOUNTER — Ambulatory Visit (INDEPENDENT_AMBULATORY_CARE_PROVIDER_SITE_OTHER): Payer: Medicare Other | Admitting: Family Medicine

## 2021-01-19 VITALS — BP 100/84 | HR 73 | Temp 97.8°F | Resp 16 | Ht 69.0 in | Wt 148.0 lb

## 2021-01-19 DIAGNOSIS — R778 Other specified abnormalities of plasma proteins: Secondary | ICD-10-CM

## 2021-01-19 DIAGNOSIS — F840 Autistic disorder: Secondary | ICD-10-CM

## 2021-01-19 DIAGNOSIS — R569 Unspecified convulsions: Secondary | ICD-10-CM

## 2021-01-19 DIAGNOSIS — F439 Reaction to severe stress, unspecified: Secondary | ICD-10-CM

## 2021-01-22 ENCOUNTER — Encounter: Payer: Self-pay | Admitting: Internal Medicine

## 2021-01-22 ENCOUNTER — Ambulatory Visit (INDEPENDENT_AMBULATORY_CARE_PROVIDER_SITE_OTHER): Payer: Medicare Other | Admitting: Internal Medicine

## 2021-01-22 ENCOUNTER — Other Ambulatory Visit: Payer: Self-pay

## 2021-01-22 VITALS — BP 98/62 | HR 52 | Ht 69.0 in | Wt 154.0 lb

## 2021-01-22 DIAGNOSIS — R778 Other specified abnormalities of plasma proteins: Secondary | ICD-10-CM | POA: Diagnosis not present

## 2021-01-22 DIAGNOSIS — R9431 Abnormal electrocardiogram [ECG] [EKG]: Secondary | ICD-10-CM

## 2021-01-22 DIAGNOSIS — E785 Hyperlipidemia, unspecified: Secondary | ICD-10-CM | POA: Diagnosis not present

## 2021-01-22 MED ORDER — ASPIRIN EC 81 MG PO TBEC
81.0000 mg | DELAYED_RELEASE_TABLET | Freq: Every day | ORAL | Status: AC
Start: 2021-01-22 — End: ?

## 2021-01-22 MED ORDER — ATORVASTATIN CALCIUM 20 MG PO TABS
20.0000 mg | ORAL_TABLET | Freq: Every day | ORAL | 5 refills | Status: DC
Start: 2021-01-22 — End: 2021-06-28

## 2021-01-22 NOTE — Patient Instructions (Signed)
Medication Instructions:  Your physician has recommended you make the following change in your medication:   START Aspirin 81 mg daily. This can be purchased over the counter.  START Atorvastatin (lipitor) 20 mg daily. An Rx has been sent to your pharmacy.    *If you need a refill on your cardiac medications before your next appointment, please call your pharmacy*   Lab Work: None ordered If you have labs (blood work) drawn today and your tests are completely normal, you will receive your results only by: Marland Kitchen MyChart Message (if you have MyChart) OR . A paper copy in the mail If you have any lab test that is abnormal or we need to change your treatment, we will call you to review the results.   Testing/Procedures: None ordered   Follow-Up: At [facility], you and your health needs are our priority.  As part of our continuing mission to provide you with exceptional heart care, we have created designated Provider Care Teams.  These Care Teams include your primary Cardiologist (physician) and Advanced Practice Providers (APPs -  Physician Assistants and Nurse Practitioners) who all work together to provide you with the care you need, when you need it.  We recommend signing up for the patient portal called "MyChart".  Sign up information is provided on this After Visit Summary.  MyChart is used to connect with patients for Virtual Visits (Telemedicine).  Patients are able to view lab/test results, encounter notes, upcoming appointments, etc.  Non-urgent messages can be sent to your provider as well.   To learn more about what you can do with MyChart, go to [email or web address].    Your next appointment:   4 week(s)  The format for your next appointment:   In Person  Provider:   Yvonne Kendall, MD   Other Instructions N/A

## 2021-01-22 NOTE — Progress Notes (Signed)
New Outpatient Visit Date: 01/22/2021  Referring Provider: Alba Cory, MD [address] [place],  [place] [address]  Chief Complaint: Elevated troponin  HPI:  Erik Henderson is a 60 y.o. male who is being seen today for the evaluation of elevated troponin at the request of Dr. Carlynn Purl. 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  He was seen at [facility] in early March due to an episode of staring and diaphoresis.  His mother noted that he seemed to be moaning.  His mother was concerned that he was in pain and possibly having a seizure.  He had been out of Depakote for 2 days prior to this event.  He was observed at [facility] where troponin was found to be mildly elevated (56->73->44).  EKG was read as sinus bradycardia with nonspecific ST/T changes.  Echocardiogram was normal.  Today, Erik Henderson is accompanied by his mother and sister who provide all history.  He has not had any further episodes similar to what brought him to [facility] earlier this month.  He is unable to answer any questions or express symptoms.  They have not seen anything that would indicate that he is in pain or out of breath.  He denies a history of prior heart disease and testing.  He has not had any frank syncope nor falls.  He also has not had any edema.  Erik Henderson lives with his mother, who is his primary caretaker.  He receives his medications reliably.  He spends most of his day watching television.  --------------------------------------------------------------------------------------------------  Cardiovascular History & Procedures: Cardiovascular Problems:  Elevated troponin  Risk Factors:  Male gender and age greater than 39  Cath/PCI:  None  CV Surgery:  None  EP Procedures and Devices:  None  Non-Invasive Evaluation(s):  TTE (01/08/2021, [facility]): Normal LV size and wall thickness.  LVEF greater than 55%.  Normal RV size and  function.  No significant valvular abnormality.  Recent CV Pertinent Labs: Lab Results  Component Value Date   CHOL 183 04/16/2019   HDL 67 04/16/2019   LDLCALC 101 (H) 04/16/2019   TRIG 68 04/16/2019   CHOLHDL 2.7 04/16/2019   K 4.2 02/20/2020   BUN 9 02/20/2020   CREATININE 0.99 02/20/2020    --------------------------------------------------------------------------------------------------  Past Medical History:  Diagnosis Date  . Allergy   . Anxiety   . Autism   . Autistic disorder   . Mental disorder   . Mental retardation     Past Surgical History:  Procedure Laterality Date  . NO PAST SURGERIES      Current Meds  Medication Sig  . clonazePAM (KLONOPIN) 0.5 MG tablet Take 0.5 mg (one tablet) three times a day.  Marland Kitchen DEPAKOTE 250 MG DR tablet TAKE ONE TABLET BY MOUTH DAILY AT BREAKFAST THEN TAKE ONE TABLET AT LUNCH AND TAKE TWO TABLETS BY MOUTH AT BEDTIME  . escitalopram (LEXAPRO) 10 MG tablet Take 1 tablet by mouth daily.  . GNP MELATONIN 3 MG TABS Take 1 tablet by mouth every evening.  Marland Kitchen LORazepam (ATIVAN) 1 MG tablet Take one tablet by mouth twice daily    Allergies: Pollen extract  Social History   Tobacco Use  . Smoking status: Never Smoker  . Smokeless tobacco: Never Used  . Tobacco comment: smoking cessation materials not required  Vaping Use  . Vaping Use: Never used  Substance Use Topics  . Alcohol use: No  Alcohol/week: 0.0 standard drinks  . Drug use: No    Family History  Problem Relation Age of Onset  . Hypertension Mother   . Hyperlipidemia Mother   . Diabetes Mother   . Cancer Mother        stomach  . Coronary artery disease Father   . Gout Father   . Heart failure Father   . Cancer Father        prostate  . Hypertension Brother   . Autism Brother   . Mental retardation Brother     Review of Systems: A 12-system review of systems was performed and was negative except as noted in the HPI based on history provided by mother  and sister.  --------------------------------------------------------------------------------------------------  Physical Exam: BP 98/62 (BP Location: Left Arm, Patient Position: Sitting, Cuff Size: Normal)   Pulse (!) 52   Ht 5\' 9"  (1.753 m)   Wt 154 lb (69.9 kg)   SpO2 98%   BMI 22.74 kg/m   General: NAD.  He does not make eye contact or answer questions. HEENT: No conjunctival pallor or scleral icterus. Facemask in place. Neck: Supple without lymphadenopathy, thyromegaly, JVD, or HJR. No carotid bruit. Lungs: Normal work of breathing.  Poor inspiratory effort.  Clear to auscultation bilaterally without wheezes or crackles. Heart: Regular rate and rhythm without murmurs, rubs, or gallops. Non-displaced PMI. Abd: Bowel sounds present. Soft, NT/ND without hepatosplenomegaly Ext: No lower extremity edema. Radial, PT, and DP pulses are 2+ bilaterally Skin: Warm and dry without rash. Neuro: Cranial nerves grossly intact.  Able to move all 4 extremities. Psych: Awake but falls asleep during examination.  He does not answer questions or make eye contact.  EKG: Sinus bradycardia with anterolateral and inferior T wave inversions.  Lab Results  Component Value Date   WBC 4.3 02/20/2020   HGB 14.6 02/20/2020   HCT 43.9 02/20/2020   MCV 88.7 02/20/2020   PLT 224 02/20/2020    Lab Results  Component Value Date   NA 139 02/20/2020   K 4.2 02/20/2020   CL 102 02/20/2020   CO2 31 02/20/2020   BUN 9 02/20/2020   CREATININE 0.99 02/20/2020   GLUCOSE 76 02/20/2020   ALT 12 02/20/2020    Lipid panel (11/20/2019, [facility]): Total cholesterol 171, triglycerides 120, HDL 46, LDL 101  --------------------------------------------------------------------------------------------------  ASSESSMENT AND PLAN: Abnormal EKG and elevated troponin: Erik Henderson is unable to provide any history so it is difficult to ascertain if he is experiencing angina.  Recent episode leading to observation at [facility]  sounds more neurologic in nature, though rise and fall in troponin and EKG changes noted today are concerning for underlying ischemic heart disease.  Echocardiogram at [facility] was reassuring with preserved LVEF and no wall motion abnormality.  We discussed further evaluation options, including noninvasive testing (MPI and CTA) versus cardiac catheterization.  Erik Henderson' family does not believe that he would be able to lie still or perform breath-holding that would be needed for stress testing or CTA.  Under the circumstances, we have considered performing cardiac catheterization with possible PCI under general anesthesia.  Alternatively, we could defer testing and pursue medical therapy with aspirin and statin.  Erik Henderson' family has agreed to medical therapy while they contemplate proceeding with catheterization.  We will see him back in about a month to readdress this.  I advised his family to seek immediate medical attention were he to exhibit signs of chest pain, shortness of breath, or syncope.  Hyperlipidemia: Lipid  panel at [facility] last year was notable for mildly elevated LDL.  In the setting of concern for ischemic heart disease, we have agreed to add atorvastatin 20 mg daily for target LDL less than 70.  He will need a lipid panel and ALT in about 3 months to assess response.  Follow-up: Return to clinic in 1 month.  Yvonne Kendall, MD 01/22/2021 3:04 PM

## 2021-01-23 ENCOUNTER — Encounter: Payer: Self-pay | Admitting: Internal Medicine

## 2021-01-23 DIAGNOSIS — R9431 Abnormal electrocardiogram [ECG] [EKG]: Secondary | ICD-10-CM | POA: Insufficient documentation

## 2021-01-23 DIAGNOSIS — R778 Other specified abnormalities of plasma proteins: Secondary | ICD-10-CM | POA: Insufficient documentation

## 2021-02-13 NOTE — Progress Notes (Signed)
Follow-up Outpatient Visit Date: 02/15/2021  Primary Care Provider: Steele Sizer, MD [address] [place] [address]  Chief Complaint: Follow-up suspected coronary artery disease  HPI:  Erik Henderson is a 60 y.o. 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  I met him a month ago after preceding ED visit in early March after an episode of staring and diaphoresis.  He was evaluated at [facility] and noted to have a mild troponin elevation.  EKG at that time showed sinus bradycardia with nonspecific ST/T changes.  Echocardiogram was normal.  At our last visit, we discussed further evaluation options.  Given Erik Henderson intellectual disability, it was felt that the most reasonable options would be medical therapy or catheterization under general anesthesia.  His family elected to pursue medical therapy with close outpatient follow-up.  We therefore agreed to add aspirin and atorvastatin.  Today, Erik Henderson family reports that he has been doing well.  He has not had any further staring or shaking episodes.  He has not indicated any pain, including chest pain.  She does not appear to be out of breath.  His family notes that he "runs everywhere" and has not slowed down compared to years passed.  No adverse effects of atorvastatin or aspirin have been observed.  --------------------------------------------------------------------------------------------------  Cardiovascular History & Procedures: Cardiovascular Problems:  Elevated troponin  Risk Factors:  Male gender and age greater than 69  Cath/PCI:  None  CV Surgery:  None  EP Procedures and Devices:  None  Non-Invasive Evaluation(s):  TTE (01/08/2021, [facility]): Normal LV size and wall thickness.  LVEF greater than 55%.  Normal RV size and function.  No significant valvular  abnormality.   Recent CV Pertinent Labs: Lab Results  Component Value Date   CHOL 183 04/16/2019   HDL 67 04/16/2019   LDLCALC 101 (H) 04/16/2019   TRIG 68 04/16/2019   CHOLHDL 2.7 04/16/2019   K 4.2 02/20/2020   BUN 9 02/20/2020   CREATININE 0.99 02/20/2020    Past medical and surgical history were reviewed and updated in EPIC.  Current Meds  Medication Sig  . aspirin EC 81 MG tablet Take 1 tablet (81 mg total) by mouth daily. Swallow whole.  Marland Kitchen atorvastatin (LIPITOR) 20 MG tablet Take 1 tablet (20 mg total) by mouth daily.  . clonazePAM (KLONOPIN) 0.5 MG tablet Take 0.5 mg (one tablet) three times a day.  Marland Kitchen COVID-19 mRNA Vac-TriS, Pfizer, SUSP injection USE AS DIRECTED  . DEPAKOTE 250 MG DR tablet TAKE ONE TABLET BY MOUTH DAILY AT BREAKFAST THEN TAKE ONE TABLET AT LUNCH AND TAKE TWO TABLETS BY MOUTH AT BEDTIME  . GNP MELATONIN 3 MG TABS Take 1 tablet by mouth every evening.  Marland Kitchen LORazepam (ATIVAN) 1 MG tablet Take one tablet by mouth twice daily    Allergies: Pollen extract  Social History   Tobacco Use  . Smoking status: Never Smoker  . Smokeless tobacco: Never Used  . Tobacco comment: smoking cessation materials not required  Vaping Use  . Vaping Use: Never used  Substance Use Topics  . Alcohol use: No    Alcohol/week: 0.0 standard drinks  . Drug use: No    Family History  Problem Relation Age of Onset  . Hypertension Mother   . Hyperlipidemia Mother   . Diabetes Mother   . Cancer Mother  stomach  . Heart disease Mother   . Coronary artery disease Father   . Gout Father   . Heart failure Father   . Cancer Father        prostate  . Hypertension Brother   . Autism Brother   . Mental retardation Brother     Review of Systems: A 12-system review of systems was performed and was negative except as noted in the HPI.  --------------------------------------------------------------------------------------------------  Physical Exam: BP 112/76    Pulse 63   Ht _0  (1.753 m)   Wt 153 lb 9.6 oz (69.7 kg)   BMI 22.68 kg/m   General:  NAD. Neck: No JVD or HJR. Lungs: Clear to auscultation bilaterally without wheezes or crackles. Heart: Regular rate and rhythm without murmurs, rubs, or gallops. Abdomen: Soft, nontender, nondistended. Extremities: No lower extremity edema.  Lab Results  Component Value Date   WBC 4.3 02/20/2020   HGB 14.6 02/20/2020   HCT 43.9 02/20/2020   MCV 88.7 02/20/2020   PLT 224 02/20/2020    Lab Results  Component Value Date   NA 139 02/20/2020   K 4.2 02/20/2020   CL 102 02/20/2020   CO2 31 02/20/2020   BUN 9 02/20/2020   CREATININE 0.99 02/20/2020   GLUCOSE 76 02/20/2020   ALT 12 02/20/2020    Lab Results  Component Value Date   CHOL 183 04/16/2019   HDL 67 04/16/2019   LDLCALC 101 (H) 04/16/2019   TRIG 68 04/16/2019   CHOLHDL 2.7 04/16/2019    --------------------------------------------------------------------------------------------------  ASSESSMENT AND PLAN: Elevated troponin and suspected coronary artery disease: It is difficult to assess Erik Henderson' symptoms given his intellectual disability and mutism.  However, he appears comfortable today and his family reports that he has seemed to be like his usual self.  We discussed further evaluation options at length at our last visit and they would like to defer additional testing at this time.  We will continue aspirin 81 mg daily and atorvastatin 20 mg daily, with plans to obtain a CMP and fasting lipid panel in ~2 months.  Hyperlipidemia: Continue atorvastatin 20 mg daily with CMP and fasting lipid panel in ~2 months.  Follow-up: Return to clinic in 3-4 months.  Nelva Bush, MD 02/15/2021 10:07 AM

## 2021-02-15 ENCOUNTER — Ambulatory Visit (INDEPENDENT_AMBULATORY_CARE_PROVIDER_SITE_OTHER): Payer: Medicare Other | Admitting: Internal Medicine

## 2021-02-15 ENCOUNTER — Other Ambulatory Visit: Payer: Self-pay

## 2021-02-15 ENCOUNTER — Encounter: Payer: Self-pay | Admitting: Internal Medicine

## 2021-02-15 VITALS — BP 112/76 | HR 63 | Ht 69.0 in | Wt 153.6 lb

## 2021-02-15 DIAGNOSIS — E785 Hyperlipidemia, unspecified: Secondary | ICD-10-CM

## 2021-02-15 DIAGNOSIS — R778 Other specified abnormalities of plasma proteins: Secondary | ICD-10-CM | POA: Diagnosis not present

## 2021-02-15 NOTE — Patient Instructions (Addendum)
Medication Instructions:  Your physician recommends that you continue on your current medications as directed. Please refer to the Current Medication list given to you today.  *If you need a refill on your cardiac medications before your next appointment, please call your pharmacy*   Lab Work: Your physician recommends that you return for lab work in: 2 months (CMET, LIPID)  Please go to the [employer].  If you have labs (blood work) drawn today and your tests are completely normal, you will receive your results only by: Marland Kitchen MyChart Message (if you have MyChart) OR . A paper copy in the mail If you have any lab test that is abnormal or we need to change your treatment, we will call you to review the results.   Follow-Up: At [facility], you and your health needs are our priority.  As part of our continuing mission to provide you with exceptional heart care, we have created designated Provider Care Teams.  These Care Teams include your primary Cardiologist (physician) and Advanced Practice Providers (APPs -  Physician Assistants and Nurse Practitioners) who all work together to provide you with the care you need, when you need it.  We recommend signing up for the patient portal called "MyChart".  Sign up information is provided on this After Visit Summary.  MyChart is used to connect with patients for Virtual Visits (Telemedicine).  Patients are able to view lab/test results, encounter notes, upcoming appointments, etc.  Non-urgent messages can be sent to your provider as well.   To learn more about what you can do with MyChart, go to [email or web address].    Your next appointment:   3-4 month(s)  The format for your next appointment:   In Person  Provider:   You may see Yvonne Kendall, MD or one of the following Advanced Practice Providers on your designated Care Team:    Nicolasa Ducking, NP  Eula Listen, PA-C  Marisue Ivan, PA-C  Cadence Leeds, [place]  Gillian Shields, NP

## 2021-02-19 ENCOUNTER — Ambulatory Visit: Payer: Medicare Other | Admitting: Physician Assistant

## 2021-05-14 ENCOUNTER — Other Ambulatory Visit
Admission: RE | Admit: 2021-05-14 | Discharge: 2021-05-14 | Disposition: A | Discharge status: Home / Self Care | Payer: Medicare Other | Source: Ambulatory Visit | Attending: Internal Medicine | Admitting: Internal Medicine

## 2021-05-14 DIAGNOSIS — E785 Hyperlipidemia, unspecified: Secondary | ICD-10-CM | POA: Diagnosis not present

## 2021-05-14 LAB — LIPID PANEL
Cholesterol: 134 mg/dL (ref 0–200)
HDL: 50 mg/dL (ref >40)
LDL Cholesterol: 70 mg/dL (ref 0–99)
Total CHOL/HDL Ratio: 2.7 RATIO
Triglycerides: 69 mg/dL (ref <150)
VLDL: 14 mg/dL (ref 0–40)

## 2021-05-14 LAB — COMPREHENSIVE METABOLIC PANEL
ALT: 20 U/L (ref 0–44)
AST: 25 U/L (ref 15–41)
Albumin: 3.8 g/dL (ref 3.5–5.0)
Alkaline Phosphatase: 42 U/L (ref 38–126)
Anion gap: 3 — ABNORMAL LOW (ref 5–15)
BUN: 10 mg/dL (ref 6–20)
CO2: 30 mmol/L (ref 22–32)
Calcium: 9 mg/dL (ref 8.9–10.3)
Chloride: 103 mmol/L (ref 98–111)
Creatinine, Ser: 0.99 mg/dL (ref 0.61–1.24)
GFR, Estimated: >60 mL/min (ref >60)
Glucose, Bld: 93 mg/dL (ref 70–99)
Potassium: 4.6 mmol/L (ref 3.5–5.1)
Sodium: 136 mmol/L (ref 135–145)
Total Bilirubin: 0.9 mg/dL (ref 0.3–1.2)
Total Protein: 8 g/dL (ref 6.5–8.1)

## 2021-05-17 ENCOUNTER — Telehealth: Payer: Self-pay

## 2021-05-17 NOTE — Telephone Encounter (Signed)
Tempted to reach pt via phone, unable to make contact, phone rings out with no VM to leave message.

## 2021-05-18 NOTE — Telephone Encounter (Signed)
Attempted to call pt x 2. No answer. No voicemail set up. Unable to leave vm.

## 2021-05-30 ENCOUNTER — Emergency Department (HOSPITAL_COMMUNITY)
Admission: EM | Admit: 2021-05-30 | Discharge: 2021-05-30 | Disposition: A | Discharge status: Home / Self Care | Payer: Medicare Other | Attending: Emergency Medicine | Admitting: Emergency Medicine

## 2021-05-30 DIAGNOSIS — T50904A Poisoning by unspecified drugs, medicaments and biological substances, undetermined, initial encounter: Secondary | ICD-10-CM | POA: Diagnosis not present

## 2021-05-30 DIAGNOSIS — T40421A Poisoning by tramadol, accidental (unintentional), initial encounter: Secondary | ICD-10-CM | POA: Diagnosis not present

## 2021-05-30 DIAGNOSIS — F840 Autistic disorder: Secondary | ICD-10-CM | POA: Insufficient documentation

## 2021-05-30 DIAGNOSIS — F32A Depression, unspecified: Secondary | ICD-10-CM | POA: Diagnosis not present

## 2021-05-30 DIAGNOSIS — D649 Anemia, unspecified: Secondary | ICD-10-CM | POA: Diagnosis not present

## 2021-05-30 DIAGNOSIS — T50901A Poisoning by unspecified drugs, medicaments and biological substances, accidental (unintentional), initial encounter: Secondary | ICD-10-CM | POA: Diagnosis not present

## 2021-05-30 DIAGNOSIS — R404 Transient alteration of awareness: Secondary | ICD-10-CM | POA: Diagnosis not present

## 2021-05-30 DIAGNOSIS — T887XXA Unspecified adverse effect of drug or medicament, initial encounter: Secondary | ICD-10-CM | POA: Diagnosis not present

## 2021-05-30 DIAGNOSIS — Z7982 Long term (current) use of aspirin: Secondary | ICD-10-CM | POA: Insufficient documentation

## 2021-05-30 DIAGNOSIS — R9431 Abnormal electrocardiogram [ECG] [EKG]: Secondary | ICD-10-CM | POA: Diagnosis not present

## 2021-05-30 DIAGNOSIS — R569 Unspecified convulsions: Secondary | ICD-10-CM | POA: Diagnosis not present

## 2021-05-30 LAB — COMPREHENSIVE METABOLIC PANEL
ALT: 15 U/L (ref 0–44)
AST: 22 U/L (ref 15–41)
Albumin: 3.3 g/dL — ABNORMAL LOW (ref 3.5–5.0)
Alkaline Phosphatase: 42 U/L (ref 38–126)
Anion gap: 6 (ref 5–15)
BUN: 13 mg/dL (ref 6–20)
CO2: 29 mmol/L (ref 22–32)
Calcium: 8.6 mg/dL — ABNORMAL LOW (ref 8.9–10.3)
Chloride: 101 mmol/L (ref 98–111)
Creatinine, Ser: 0.84 mg/dL (ref 0.61–1.24)
GFR, Estimated: >60 mL/min (ref >60)
Glucose, Bld: 88 mg/dL (ref 70–99)
Potassium: 3.6 mmol/L (ref 3.5–5.1)
Sodium: 136 mmol/L (ref 135–145)
Total Bilirubin: 0.5 mg/dL (ref 0.3–1.2)
Total Protein: 6.6 g/dL (ref 6.5–8.1)

## 2021-05-30 LAB — CBC WITH DIFFERENTIAL/PLATELET
Abs Immature Granulocytes: 0.04 10*3/uL (ref 0.00–0.07)
Basophils Absolute: 0 10*3/uL (ref 0.0–0.1)
Basophils Relative: 1 %
Eosinophils Absolute: 0.1 10*3/uL (ref 0.0–0.5)
Eosinophils Relative: 2 %
HCT: 38.4 % — ABNORMAL LOW (ref 39.0–52.0)
Hemoglobin: 12.4 g/dL — ABNORMAL LOW (ref 13.0–17.0)
Immature Granulocytes: 1 %
Lymphocytes Relative: 31 %
Lymphs Abs: 1.7 10*3/uL (ref 0.7–4.0)
MCH: 30.3 pg (ref 26.0–34.0)
MCHC: 32.3 g/dL (ref 30.0–36.0)
MCV: 93.9 fL (ref 80.0–100.0)
Monocytes Absolute: 0.8 10*3/uL (ref 0.1–1.0)
Monocytes Relative: 14 %
Neutro Abs: 2.8 10*3/uL (ref 1.7–7.7)
Neutrophils Relative %: 51 %
Platelets: 166 10*3/uL (ref 150–400)
RBC: 4.09 MIL/uL — ABNORMAL LOW (ref 4.22–5.81)
RDW: 11.9 % (ref 11.5–15.5)
WBC: 5.3 10*3/uL (ref 4.0–10.5)
nRBC: 0 % (ref 0.0–0.2)

## 2021-05-30 LAB — ETHANOL: Alcohol, Ethyl (B): <10 mg/dL (ref <10)

## 2021-05-30 LAB — SALICYLATE LEVEL: Salicylate Lvl: <7 mg/dL — ABNORMAL LOW (ref 7.0–30.0)

## 2021-05-30 LAB — ACETAMINOPHEN LEVEL: Acetaminophen (Tylenol), Serum: <10 ug/mL — ABNORMAL LOW (ref 10–30)

## 2021-05-30 NOTE — Discharge Instructions (Addendum)
Please keep all medication in a safe and secure location.

## 2021-05-30 NOTE — ED Notes (Signed)
ED Provider at bedside. 

## 2021-05-30 NOTE — ED Notes (Signed)
Pt has family at bedside now to provide collateral information to EDP. Pt non-verbal. Pt now resting and sleeping comfortably. Oxygen stable. EDP at bedside.

## 2021-05-30 NOTE — ED Provider Notes (Signed)
[facility] EMERGENCY DEPARTMENT Provider Note   CSN: [id number] Arrival date & time: 05/30/21  0146     History Chief Complaint  Patient presents with   Ingestion    Erik Henderson is a 60 y.o. male.  The history is provided by a parent. The history is limited by the condition of the patient (Patient is nonverbal).  Ingestion He has history of hyperlipidemia, autism and was brought in with possible ingestion of tramadol.  His mother found an open bottle of tramadol with pills strewn over the floor and she is not sure if the patient ingested any.  This was at about 48 PM.  Mother states that his mental state is at his baseline.   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   Seizure (HCC) 04/16/2019   Behavior concern in adult 09/22/2018   Behavioral change 06/27/2018   Intellectual disability 06/27/2018   Hyperglycemia 08/17/2015   Anxiety 04/14/2015   Hyperlipidemia LDL goal <70 04/14/2015   Cognitive impairment 04/14/2015   Autism spectrum disorder 02/23/2015   Hay fever 02/21/2008    Past Surgical History:  Procedure Laterality Date   NO PAST SURGERIES         Family History  Problem Relation Age of Onset   Hypertension Mother    Hyperlipidemia Mother    Diabetes Mother    Cancer Mother        stomach   Heart disease Mother    Coronary artery disease Father    Gout Father    Heart failure Father    Cancer Father        prostate   Hypertension Brother    Autism Brother    Mental retardation Brother     Social History   Tobacco Use   Smoking status: Never   Smokeless tobacco: Never   Tobacco comments:    smoking  cessation materials not required  Vaping Use   Vaping Use: Never used  Substance Use Topics   Alcohol use: No    Alcohol/week: 0.0 standard drinks   Drug use: No    Home Medications Prior to Admission medications   Medication Sig Start Date End Date Taking? Authorizing Provider  aspirin EC 81 MG tablet Take 1 tablet (81 mg total) by mouth daily. Swallow whole. 01/22/21   End, Cristal Deer, MD  atorvastatin (LIPITOR) 20 MG tablet Take 1 tablet (20 mg total) by mouth daily. 01/22/21 04/22/21  End, Cristal Deer, MD  clonazePAM (KLONOPIN) 0.5 MG tablet Take 0.5 mg (one tablet) three times a day. 04/15/20   [provider]  COVID-19 mRNA Vac-TriS, Pfizer, SUSP injection USE AS DIRECTED 12/18/20 12/18/21  Judyann Munson, MD  DEPAKOTE 250 MG DR tablet TAKE ONE TABLET BY MOUTH DAILY AT BREAKFAST THEN TAKE ONE TABLET AT LUNCH AND TAKE TWO TABLETS BY MOUTH AT BEDTIME 04/13/19   Alba Cory, MD  escitalopram (LEXAPRO) 10 MG tablet Take 1 tablet by mouth daily. 09/30/20 01/22/21  [provider]  GNP MELATONIN 3 MG TABS Take 1 tablet by mouth every evening. 04/02/19   [provider]  LORazepam (ATIVAN) 1  MG tablet Take one tablet by mouth twice daily 12/26/18   [provider]    Allergies    Pollen extract  Review of Systems   Review of Systems  Unable to perform ROS: Patient nonverbal   Physical Exam Updated Vital Signs BP 104/80 (BP Location: Left Arm)   Pulse 60   Temp 98.2 F (36.8 C) (Oral)   Resp 16   Ht 5\' 9"  (1.753 m)   Wt 77 kg   SpO2 99%   BMI 25.06 kg/m   Physical Exam Vitals and nursing note reviewed.  60 year old male, resting comfortably and in no acute distress. Vital signs are normal. Oxygen saturation is 99%, which is normal. Head is normocephalic and atraumatic. PERRLA, EOMI. Oropharynx is clear. Neck is nontender and supple without adenopathy or JVD. Back is nontender and there is no CVA tenderness. Lungs are clear without rales,  wheezes, or rhonchi. Chest is nontender. Heart has regular rate and rhythm without murmur. Abdomen is soft, flat, nontender without masses or hepatosplenomegaly and peristalsis is normoactive. Extremities have no cyanosis or edema, full range of motion is present. Skin is warm and dry without rash. Neurologic: Awake but nonverbal, will not follow commands.  Moves all extremities equally.  ED Results / Procedures / Treatments   Labs (all labs ordered are listed, but only abnormal results are displayed) Labs Reviewed  ACETAMINOPHEN LEVEL - Abnormal; Notable for the following components:      Result Value   Acetaminophen (Tylenol), Serum <10 (*)    All other components within normal limits  COMPREHENSIVE METABOLIC PANEL - Abnormal; Notable for the following components:   Calcium 8.6 (*)    Albumin 3.3 (*)    All other components within normal limits  SALICYLATE LEVEL - Abnormal; Notable for the following components:   Salicylate Lvl <7.0 (*)    All other components within normal limits  CBC WITH DIFFERENTIAL/PLATELET - Abnormal; Notable for the following components:   RBC 4.09 (*)    Hemoglobin 12.4 (*)    HCT 38.4 (*)    All other components within normal limits  ETHANOL  RAPID URINE DRUG SCREEN, HOSP PERFORMED   Procedures Procedures   Medications Ordered in ED Medications - No data to display  ED Course  I have reviewed the triage vital signs and the nursing notes.  Pertinent lab results that were available during my care of the patient were reviewed by me and considered in my medical decision making (see chart for details).   MDM Rules/Calculators/A&P                         Possible overdose of tramadol.  Poison control had been consulted and recommended observation for 6hours, to extend to 10 hours if naloxone is needed.  At this point, he is 4 hours post ingestion with no sign of respiratory depression, doubt he had a significant ingestion.  He will be observed until  it has been 6 hours following ingestion.  Old records are reviewed, and he has no relevant past visits.  He has completed his 6 hours of observation with no clinical deterioration and is felt to be safe for discharge.  Labs show mild anemia which is unchanged from baseline.  He is to follow-up with PCP.  Family advised to make sure all medications are in a secure location.  Final Clinical Impression(s) / ED Diagnoses Final diagnoses:  Accidental drug ingestion, initial encounter  Normochromic  normocytic anemia    Rx / DC Orders ED Discharge Orders     None        Dione Booze, MD 05/30/21 [phone]

## 2021-05-30 NOTE — ED Triage Notes (Signed)
Per [facility] pt is MR and non-verbal and reports pt took Father's tramadol; when counted there are 156 tabs missing; poison control contacted PTA; pt alert and at baseline

## 2021-06-02 ENCOUNTER — Ambulatory Visit: Payer: Medicare Other | Admitting: Internal Medicine

## 2021-06-11 ENCOUNTER — Telehealth: Payer: Self-pay

## 2021-06-11 NOTE — Telephone Encounter (Signed)
Made appt with [facility]. For 06-14-2021 [facility] no avail

## 2021-06-11 NOTE — Telephone Encounter (Signed)
Copied from CRM [id number]. Topic: General - Other >> Jun 11, 2021  2:18 PM Erik Henderson A wrote: Reason for CRM: Patient has tested positive for COVID, via an at home test, 06/11/21  The patient is currently experiencing coughing, congestion and has previously had an elevated temperature  The patient would like to be prescribed something for discomfort   Please contact further when possible

## 2021-06-14 ENCOUNTER — Telehealth (INDEPENDENT_AMBULATORY_CARE_PROVIDER_SITE_OTHER): Payer: Medicare Other | Admitting: Family Medicine

## 2021-06-14 ENCOUNTER — Other Ambulatory Visit: Payer: Self-pay

## 2021-06-14 ENCOUNTER — Encounter: Payer: Self-pay | Admitting: Family Medicine

## 2021-06-14 DIAGNOSIS — U071 COVID-19: Secondary | ICD-10-CM

## 2021-06-14 MED ORDER — MOLNUPIRAVIR EUA 200MG CAPSULE
4.0000 | ORAL_CAPSULE | Freq: Two times a day (BID) | ORAL | 0 refills | Status: AC
  Filled 2021-06-14: qty 40, 5d supply, fill #0

## 2021-06-14 NOTE — Patient Instructions (Addendum)
It was great to see you!  Our plans for today:  - See below for self-isolation guidelines. You may end your quarantine once you are 5 days from symptom onset and fever free for 24 hours without use of tylenol or ibuprofen. Wear a well-fitting N95 mask for an additional 5 days. - Take the molnupiravir as directed. See [email or web address].[email or web address] for more information about the medication. - Certainly, if you are having difficulties breathing or unable to keep down fluids, go to the Emergency Department.   Take care and seek immediate care sooner if you develop any concerns.   Dr. Linwood Dibbles     Person Under Monitoring Name: Erik Henderson  Location: [address] [place] [address]   Infection Prevention Recommendations for Individuals Confirmed to have, or Being Evaluated for, 2019 Novel Coronavirus (COVID-19) Infection Who Receive Care at Home  Individuals who are confirmed to have, or are being evaluated for, COVID-19 should follow the prevention steps below until a healthcare provider or local or state health department says they can return to normal activities.  Stay home except to get medical care You should restrict activities outside your home, except for getting medical care. Do not go to work, school, or public areas, and do not use public transportation or taxis.  Call ahead before visiting your doctor Before your medical appointment, call the healthcare provider and tell them that you have, or are being evaluated for, COVID-19 infection. This will help the healthcare provider's office take steps to keep other people from getting infected. Ask your healthcare provider to call the local or state health department.  Monitor your symptoms Seek prompt medical attention if your illness is worsening (e.g., difficulty breathing). Before going to your medical appointment, call the healthcare provider and tell them that you have, or are being evaluated for, COVID-19  infection. Ask your healthcare provider to call the local or state health department.  Wear a facemask You should wear a facemask that covers your nose and mouth when you are in the same room with other people and when you visit a healthcare provider. People who live with or visit you should also wear a facemask while they are in the same room with you.  Separate yourself from other people in your home As much as possible, you should stay in a different room from other people in your home. Also, you should use a separate bathroom, if available.  Avoid sharing household items You should not share dishes, drinking glasses, cups, eating utensils, towels, bedding, or other items with other people in your home. After using these items, you should wash them thoroughly with soap and water.  Cover your coughs and sneezes Cover your mouth and nose with a tissue when you cough or sneeze, or you can cough or sneeze into your sleeve. Throw used tissues in a lined trash can, and immediately wash your hands with soap and water for at least 20 seconds or use an alcohol-based hand rub.  Wash your [employer] your hands often and thoroughly with soap and water for at least 20 seconds. You can use an alcohol-based hand sanitizer if soap and water are not available and if your hands are not visibly dirty. Avoid touching your eyes, nose, and mouth with unwashed hands.   Prevention Steps for Caregivers and Household Members of Individuals Confirmed to have, or Being Evaluated for, COVID-19 Infection Being Cared for in the Home  If you live with, or provide care at home for, a  person confirmed to have, or being evaluated for, COVID-19 infection please follow these guidelines to prevent infection:  Follow healthcare provider's instructions Make sure that you understand and can help the patient follow any healthcare provider instructions for all care.  Provide for the patient's basic needs You should  help the patient with basic needs in the home and provide support for getting groceries, prescriptions, and other personal needs.  Monitor the patient's symptoms If they are getting sicker, call his or her medical provider and tell them that the patient has, or is being evaluated for, COVID-19 infection. This will help the healthcare provider's office take steps to keep other people from getting infected. Ask the healthcare provider to call the local or state health department.  Limit the number of people who have contact with the patient If possible, have only one caregiver for the patient. Other household members should stay in another home or place of residence. If this is not possible, they should stay in another room, or be separated from the patient as much as possible. Use a separate bathroom, if available. Restrict visitors who do not have an essential need to be in the home.  Keep older adults, very young children, and other sick people away from the patient Keep older adults, very young children, and those who have compromised immune systems or chronic health conditions away from the patient. This includes people with chronic heart, lung, or kidney conditions, diabetes, and cancer.  Ensure good ventilation Make sure that shared spaces in the home have good air flow, such as from an air conditioner or an opened window, weather permitting.  Wash your hands often Wash your hands often and thoroughly with soap and water for at least 20 seconds. You can use an alcohol based hand sanitizer if soap and water are not available and if your hands are not visibly dirty. Avoid touching your eyes, nose, and mouth with unwashed hands. Use disposable paper towels to dry your hands. If not available, use dedicated cloth towels and replace them when they become wet.  Wear a facemask and gloves Wear a disposable facemask at all times in the room and gloves when you touch or have contact with the  patient's blood, body fluids, and/or secretions or excretions, such as sweat, saliva, sputum, nasal mucus, vomit, urine, or feces.  Ensure the mask fits over your nose and mouth tightly, and do not touch it during use. Throw out disposable facemasks and gloves after using them. Do not reuse. Wash your hands immediately after removing your facemask and gloves. If your personal clothing becomes contaminated, carefully remove clothing and launder. Wash your hands after handling contaminated clothing. Place all used disposable facemasks, gloves, and other waste in a lined container before disposing them with other household waste. Remove gloves and wash your hands immediately after handling these items.  Do not share dishes, glasses, or other household items with the patient Avoid sharing household items. You should not share dishes, drinking glasses, cups, eating utensils, towels, bedding, or other items with a patient who is confirmed to have, or being evaluated for, COVID-19 infection. After the person uses these items, you should wash them thoroughly with soap and water.  Wash laundry thoroughly Immediately remove and wash clothes or bedding that have blood, body fluids, and/or secretions or excretions, such as sweat, saliva, sputum, nasal mucus, vomit, urine, or feces, on them. Wear gloves when handling laundry from the patient. Read and follow directions on labels of laundry or  clothing items and detergent. In general, wash and dry with the warmest temperatures recommended on the label.  Clean all areas the individual has used often Clean all touchable surfaces, such as counters, tabletops, doorknobs, bathroom fixtures, toilets, phones, keyboards, tablets, and bedside tables, every day. Also, clean any surfaces that may have blood, body fluids, and/or secretions or excretions on them. Wear gloves when cleaning surfaces the patient has come in contact with. Use a diluted bleach solution (e.g.,  dilute bleach with 1 part bleach and 10 parts water) or a household disinfectant with a label that says EPA-registered for coronaviruses. To make a bleach solution at home, add 1 tablespoon of bleach to 1 quart (4 cups) of water. For a larger supply, add  cup of bleach to 1 gallon (16 cups) of water. Read labels of cleaning products and follow recommendations provided on product labels. Labels contain instructions for safe and effective use of the cleaning product including precautions you should take when applying the product, such as wearing gloves or eye protection and making sure you have good ventilation during use of the product. Remove gloves and wash hands immediately after cleaning.  Monitor yourself for signs and symptoms of illness Caregivers and household members are considered close contacts, should monitor their health, and will be asked to limit movement outside of the home to the extent possible. Follow the monitoring steps for close contacts listed on the symptom monitoring form.   ? If you have additional questions, contact your local health department or call the epidemiologist on call at [phone] (available 24/7). ? This guidance is subject to change. For the most up-to-date guidance from [facility], please refer to their website: [email or web address]

## 2021-06-14 NOTE — Progress Notes (Signed)
Virtual Visit Note  I connected with Olivia Royse on 06/14/21 at  9:40 AM EDT by phone and verified that I am speaking with the correct person using two identifiers.  Location: Patient: home Provider: [facility] Entirety of visit conducted with patient's sister given intellectual disability.   I discussed the limitations of evaluation and management by telemedicine and the availability of in person appointments. The patient expressed understanding and agreed to proceed.  History of Present Illness:  UPPER RESPIRATORY TRACT INFECTION - COVID+ 06/11/21. - symptom onset 8/12 - no symptoms now after started OTC symptom control. - previously with cough, congestion, fever. - taking OTC cold/sinus medication with relief. - no N/V, chest pain, SOB - has received 3 doses of mRNA vaccine    Observations/Objective:  Entirety of visit conducted over the phone with patient's sister.   Assessment and Plan:  COVID-19 Doing well with mild sx. Is a candidate for COVID treatment, will rx molnupiravir, handout on EUA provided. Reviewed OTC symptom relief, self-quarantine guidelines, and emergency precautions.     I discussed the assessment and treatment plan with the patient. The patient was provided an opportunity to ask questions and all were answered. The patient agreed with the plan and demonstrated an understanding of the instructions.   The patient was advised to call back or seek an in-person evaluation if the symptoms worsen or if the condition fails to improve as anticipated.  I provided 7 minutes of non-face-to-face time during this encounter.   Caro Laroche, DO

## 2021-06-15 ENCOUNTER — Other Ambulatory Visit: Payer: Self-pay

## 2021-06-23 ENCOUNTER — Other Ambulatory Visit: Payer: Self-pay

## 2021-06-23 DIAGNOSIS — Z79899 Other long term (current) drug therapy: Secondary | ICD-10-CM

## 2021-06-23 DIAGNOSIS — F840 Autistic disorder: Secondary | ICD-10-CM | POA: Diagnosis not present

## 2021-06-23 DIAGNOSIS — F99 Mental disorder, not otherwise specified: Secondary | ICD-10-CM | POA: Diagnosis not present

## 2021-06-23 DIAGNOSIS — F79 Unspecified intellectual disabilities: Secondary | ICD-10-CM | POA: Diagnosis not present

## 2021-06-23 DIAGNOSIS — F5105 Insomnia due to other mental disorder: Secondary | ICD-10-CM | POA: Diagnosis not present

## 2021-06-24 DIAGNOSIS — Z79899 Other long term (current) drug therapy: Secondary | ICD-10-CM | POA: Diagnosis not present

## 2021-06-25 LAB — VALPROIC ACID LEVEL: Valproic Acid Lvl: 55.5 mg/L (ref 50.0–100.0)

## 2021-06-28 ENCOUNTER — Other Ambulatory Visit: Payer: Self-pay | Admitting: Internal Medicine

## 2021-06-28 NOTE — Progress Notes (Signed)
Results faxed to [facility] for review/records update.

## 2021-07-09 ENCOUNTER — Other Ambulatory Visit: Payer: Self-pay

## 2021-07-09 ENCOUNTER — Encounter: Payer: Self-pay | Admitting: Medical

## 2021-07-09 ENCOUNTER — Ambulatory Visit (INDEPENDENT_AMBULATORY_CARE_PROVIDER_SITE_OTHER): Payer: Medicare Other | Admitting: Medical

## 2021-07-09 VITALS — BP 100/66 | HR 66 | Wt 163.0 lb

## 2021-07-09 DIAGNOSIS — R9431 Abnormal electrocardiogram [ECG] [EKG]: Secondary | ICD-10-CM

## 2021-07-09 DIAGNOSIS — E782 Mixed hyperlipidemia: Secondary | ICD-10-CM

## 2021-07-09 DIAGNOSIS — R778 Other specified abnormalities of plasma proteins: Secondary | ICD-10-CM

## 2021-07-09 NOTE — Progress Notes (Signed)
Cardiology Office Note:    Date:  07/09/2021   ID:  Erik Henderson, DOB 10/05/1961, MRN [id number]  PCP:  Alba Cory, MD  [facility] Cardiologist:  Dr. Sandi Mariscal [facility] Electrophysiologist:  None   Referring MD: Alba Cory, MD   Chief Complaint: 3-4 month follow-up  History of Present Illness:    Erik Henderson is a 60 y.o. male with a hx of intellectual disability with autism spectrum disorder, OCD, mutism, seizures who presents for suspected CAD.   He had an ED visit in Amrch 2022 for starting and diaphoresis. He was evaluated at [facility] and noted to have mild troponin elevation. EKG at that time showed sinus bradycardia with nonspecific ST/T wave changes. Seen by Dr. Okey Dupre 01/22/21 and cath vs medication was discussed. Medical therapy was elected. Aspirin and atorvastatin were added.   Echo 12/2020 showed normal LV size, LVEF greater than 55%, normal RV size and function, no significant valvular abnormalities.   Last seen 02/15/21 and was doing well. No further chest pain.  Today, the patient's family members provide history. They report some mediation changes to help him relax, since he doesn't sleep well at night. They report the patient loves running and walking. No chest pain or sob. Eating and drinking normally. Family helps care for the patient. No recent illnesses. No LLE, orthopnea, pnd. EKG shows SR with no new changes.   Past Medical History:  Diagnosis Date   Allergy    Anxiety    Autism    Autistic disorder    Mental disorder    Mental retardation     Past Surgical History:  Procedure Laterality Date   NO PAST SURGERIES      Current Medications: Current Meds  Medication Sig   aspirin EC 81 MG tablet Take 1 tablet (81 mg total) by mouth daily. Swallow whole.   atorvastatin (LIPITOR) 20 MG tablet TAKE 1 TABLET BY MOUTH ONCE DAILY.   clonazePAM (KLONOPIN) 0.5 MG tablet Take 0.5 mg (one tablet) three times a day.   COVID-19 mRNA Vac-TriS, Pfizer, SUSP  injection USE AS DIRECTED   DEPAKOTE 250 MG DR tablet TAKE ONE TABLET BY MOUTH DAILY AT BREAKFAST THEN TAKE ONE TABLET AT LUNCH AND TAKE TWO TABLETS BY MOUTH AT BEDTIME   escitalopram (LEXAPRO) 20 MG tablet Take 1 tablet by mouth daily.   GNP MELATONIN 3 MG TABS Take 1 tablet by mouth every evening.   traZODone (DESYREL) 50 MG tablet Take 0.5 tablets (25 mg total) by mouth once as needed (for anxiety or trouble sleeping). Take 0.5 - 1 tablet as needed for anxiety or trouble sleeping.     Allergies:   Pollen extract   Social History   Socioeconomic History   Marital status: Single    Spouse name: Not on file   Number of children: 0   Years of education: Not on file   Highest education level: Never attended school  Occupational History   Occupation: disabled  Tobacco Use   Smoking status: Never   Smokeless tobacco: Never   Tobacco comments:    smoking cessation materials not required  Vaping Use   Vaping Use: Never used  Substance and Sexual Activity   Alcohol use: No    Alcohol/week: 0.0 standard drinks   Drug use: No   Sexual activity: Never  Other Topics Concern   Not on file  Social History Narrative   Lives with mother, brothers and sisters; sisters are caregivers; mother is guardian/POA -- family discussing  transfer of guardianship/POA to sister as parents in poor health   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                   Social Determinants of Health   Financial Resource Strain: Low Risk    Difficulty of Paying Living Expenses: Not very hard  Food Insecurity: No Food Insecurity   Worried About Programme researcher, broadcasting/film/video in the Last Year: Never true   Ran Out of Food in the Last Year: Never true  Transportation Needs: No Transportation Needs   Lack of Transportation (Medical): No   Lack of Transportation (Non-Medical): No  Physical Activity: Inactive   Days of Exercise per Week: 0 days   Minutes of Exercise per Session: 0 min   Stress: Stress Concern Present   Feeling of Stress : Rather much  Social Connections: Socially Isolated   Frequency of Communication with Friends and Family: Never   Frequency of Social Gatherings with Friends and Family: Never   Attends Religious Services: Never   Database administrator or Organizations: No   Attends Engineer, structural: Never   Marital Status: Never married     Family History: The patient's family history includes Autism in his brother; Cancer in his father and mother; Coronary artery disease in his father; Diabetes in his mother; Gout in his father; Heart disease in his mother; Heart failure in his father; Hyperlipidemia in his mother; Hypertension in his brother and mother; Mental retardation in his brother.  ROS:   Please see the history of present illness.     All other systems reviewed and are negative.  EKGs/Labs/Other Studies Reviewed:    The following studies were reviewed today:  N/A  EKG:  EKG is  ordered today.  The ekg ordered today demonstrates NSR, 66bpm, anterolateral TWI (old)  Recent Labs: 05/30/2021: ALT 15; BUN 13; Creatinine, Ser 0.84; Hemoglobin 12.4; Platelets 166; Potassium 3.6; Sodium 136  Recent Lipid Panel    Component Value Date/Time   CHOL 134 05/14/2021 1039   TRIG 69 05/14/2021 1039   HDL 50 05/14/2021 1039   CHOLHDL 2.7 05/14/2021 1039   VLDL 14 05/14/2021 1039   LDLCALC 70 05/14/2021 1039   LDLCALC 101 (H) 04/16/2019 1207    Physical Exam:    VS:  BP 100/66 (BP Location: Left Arm, Patient Position: Sitting, Cuff Size: Normal)   Pulse 66   Wt 163 lb (73.9 kg)   SpO2 96%   BMI 24.07 kg/m     Wt Readings from Last 3 Encounters:  07/09/21 163 lb (73.9 kg)  05/30/21 169 lb 11.2 oz (77 kg)  02/15/21 153 lb 9.6 oz (69.7 kg)     GEN:  Well nourished, well developed in no acute distress HEENT: Normal NECK: No JVD; No carotid bruits LYMPHATICS: No lymphadenopathy CARDIAC: RRR, no murmurs, rubs,  gallops RESPIRATORY:  Clear to auscultation without rales, wheezing or rhonchi  ABDOMEN: Soft, non-tender, non-distended MUSCULOSKELETAL:  No edema; No deformity  SKIN: Warm and dry NEUROLOGIC:  Alert and oriented x 3 PSYCHIATRIC:  Normal affect   ASSESSMENT:    1. Abnormal EKG   2. Elevated troponin   3. Hyperlipidemia, mixed    PLAN:    In order of problems listed above:  Elevated troponin with abnormal EKG Previously elevated troponin with suspected CAD. Unable to assess symptoms given intellectual disability. After discussion at the first visit, it was decided to pursue medical  management. Today family members reports patient has been been doing well. He regularly walks and runs and doesn't complain of chest pain or shortness of breath. EKG is unchanged. No plan for further work-up at this time. Continue Aspirin and statin.   HLD LDL 70 04/2021. Continue Lipitor  CMP and fasting lipid panel  Disposition: Follow up in 6 month(s) with MD/APP    Signed, Alianys Chacko David Stall, PA-C  07/09/2021 12:03 PM    [facility]

## 2021-07-09 NOTE — Patient Instructions (Signed)
Medication Instructions:  Your physician recommends that you continue on your current medications as directed. Please refer to the Current Medication list given to you today.  *If you need a refill on your cardiac medications before your next appointment, please call your pharmacy*   Lab Work: None ordered If you have labs (blood work) drawn today and your tests are completely normal, you will receive your results only by: MyChart Message (if you have MyChart) OR A paper copy in the mail If you have any lab test that is abnormal or we need to change your treatment, we will call you to review the results.   Testing/Procedures: None ordered   Follow-Up: At [facility], you and your health needs are our priority.  As part of our continuing mission to provide you with exceptional heart care, we have created designated Provider Care Teams.  These Care Teams include your primary Cardiologist (physician) and Advanced Practice Providers (APPs -  Physician Assistants and Nurse Practitioners) who all work together to provide you with the care you need, when you need it.  We recommend signing up for the patient portal called "MyChart".  Sign up information is provided on this After Visit Summary.  MyChart is used to connect with patients for Virtual Visits (Telemedicine).  Patients are able to view lab/test results, encounter notes, upcoming appointments, etc.  Non-urgent messages can be sent to your provider as well.   To learn more about what you can do with MyChart, go to [email or web address].    Your next appointment:   6 month(s)  The format for your next appointment:   In Person  Provider:   You may see Dr. Okey Dupre or one of the following Advanced Practice Providers on your designated Care Team:   Nicolasa Ducking, NP Eula Listen, PA-C Marisue Ivan, PA-C Cadence Fransico Michael, [place]   Other Instructions N/A

## 2021-07-13 NOTE — Progress Notes (Signed)
Name: Erik Henderson   MRN: [id number]    DOB: 1961/04/13   Date:07/14/2021       Progress Note  Subjective  Chief Complaint  Follow Up  HPI  Seizure disorder: caregivers states he has been seizure free for many years, but they are afraid of stopping medication. He is getting valproic acid from psychiatrist from [facility], his last level was within normal range.    Insulin Resistance:  he is on diabetic diet, he has lost 5 lbs since last visit , he has lost weight since off Risperdal, no change in appetite, no polyuria  Cough: mother and daughter are here with him today. He has been a little congested and has a wet cough, no fever or chills. Appetite has been normal and also his behavior. He had COVID -19 about one month ago but his symptoms resolved. Family members think secondary to weather change, advised nasal saline, mucinex otc and benzonatate, return if fever or change in behavior.     Dyslipidemia: on life style modification, reviewed last labs    Autism spectrum disorder: recent change in behavior intermittently, he is now under the care of psychiatrist, plus clonazepam 0.5 prn and depakote given by psychiatrist    Anemia: he is not a good candidate for colon cancer screen, family states it will be difficulty to have colonoscopy and were not able to do the hemoccult cards or cologuard. They are willing to start giving him iron supplements otc.    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   Seizure (HCC) 04/16/2019   Behavior concern in adult 09/22/2018   Behavioral change 06/27/2018   Intellectual disability 06/27/2018   Hyperglycemia 08/17/2015   Anxiety 04/14/2015   Hyperlipidemia LDL goal <70  04/14/2015   Cognitive impairment 04/14/2015   Autism spectrum disorder 02/23/2015   Hay fever 02/21/2008    Past Surgical History:  Procedure Laterality Date   NO PAST SURGERIES      Family History  Problem Relation Age of Onset   Hypertension Mother    Hyperlipidemia Mother    Diabetes Mother    Cancer Mother        stomach   Heart disease Mother    Coronary artery disease Father    Gout Father    Heart failure Father    Cancer Father        prostate   Hypertension Brother    Autism Brother    Mental retardation Brother     Social History   Tobacco Use   Smoking status: Never   Smokeless tobacco: Never   Tobacco comments:    smoking cessation materials not required  Substance Use Topics   Alcohol use: No    Alcohol/week: 0.0 standard drinks     Current Outpatient Medications:    aspirin EC 81 MG tablet, Take 1 tablet (81 mg total) by mouth daily. Swallow whole., Disp: , Rfl:    atorvastatin (LIPITOR) 20 MG tablet, TAKE 1 TABLET BY MOUTH ONCE DAILY., Disp: 30 tablet, Rfl: 11   benzonatate (TESSALON) 100 MG capsule, Take 1-2 capsules (100-200 mg total) by mouth 2 (two) times daily as needed., Disp: 40 capsule, Rfl: 0   clonazePAM (  KLONOPIN) 0.5 MG tablet, Take 1 tablet by mouth daily as needed., Disp: , Rfl:    COVID-19 mRNA Vac-TriS, Pfizer, SUSP injection, USE AS DIRECTED, Disp: .3 mL, Rfl: 0   divalproex (DEPAKOTE) 250 MG DR tablet, Take 250-750 mg by mouth 3 (three) times daily. 1 breakfast, 1 lunch and 3 at night , Disp: , Rfl:    GNP MELATONIN 3 MG TABS, Take 1 tablet by mouth every evening., Disp: , Rfl:    traZODone (DESYREL) 50 MG tablet, Take 0.5-1 tablets by mouth daily., Disp: , Rfl:    escitalopram (LEXAPRO) 20 MG tablet, Take 1 tablet by mouth daily., Disp: , Rfl:   Allergies  Allergen Reactions   Pollen Extract     Other reaction(s): Other (See Comments) Patent allergic to dust, that causes pt to sneeze.    I personally reviewed active  problem list, medication list, allergies, family history, social history with the patient/caregiver today.   ROS  Ten systems reviewed and is negative except as mentioned in HPI - given by family members, he is non verbal    Objective  Vitals:   07/14/21 1100  BP: 118/72  Pulse: 93  Resp: 18  Temp: 99.1 F (37.3 C)  TempSrc: Oral  SpO2: 95%  Weight: 158 lb 1.6 oz (71.7 kg)  Height: 5\' 9"  (1.753 m)    Body mass index is 23.35 kg/m.  Physical Exam  Constitutional: Patient appears well-developed and well-nourished No distress.  HEENT: head atraumatic, normocephalic,, neck supple Cardiovascular: Normal rate, regular rhythm and normal heart sounds.  No murmur heard. No BLE edema. Pulmonary/Chest: Effort normal and breath sounds normal. No respiratory distress. Abdominal: Soft.  There is no tenderness. Psychiatric: rocking back and forth on the chair, calm and stayed still during exam   Recent Results (from the past 2160 hour(s))  Comprehensive metabolic panel     Status: Abnormal   Collection Time: 05/14/21 10:39 AM  Result Value Ref Range   Sodium 136 135 - 145 mmol/L   Potassium 4.6 3.5 - 5.1 mmol/L   Chloride 103 98 - 111 mmol/L   CO2 30 22 - 32 mmol/L   Glucose, Bld 93 70 - 99 mg/dL    Comment: Glucose reference range applies only to samples taken after fasting for at least 8 hours.   BUN 10 6 - 20 mg/dL   Creatinine, Ser 6.28 0.61 - 1.24 mg/dL   Calcium 9.0 8.9 - 36.6 mg/dL   Total Protein 8.0 6.5 - 8.1 g/dL   Albumin 3.8 3.5 - 5.0 g/dL   AST 25 15 - 41 U/L   ALT 20 0 - 44 U/L   Alkaline Phosphatase 42 38 - 126 U/L   Total Bilirubin 0.9 0.3 - 1.2 mg/dL   GFR, Estimated >29 >47 mL/min    Comment: (NOTE) Calculated using the CKD-EPI Creatinine Equation (2021)    Anion gap 3 (L) 5 - 15    Comment: Performed at [facility], [address], [place], [place] [address]  Lipid panel     Status: None   Collection Time: 05/14/21 10:39 AM  Result Value  Ref Range   Cholesterol 134 0 - 200 mg/dL   Triglycerides 69 <035 mg/dL   HDL 50 >46 mg/dL   Total CHOL/HDL Ratio 2.7 RATIO   VLDL 14 0 - 40 mg/dL   LDL Cholesterol 70 0 - 99 mg/dL    Comment:        Total Cholesterol/HDL:CHD Risk Coronary Heart  Disease Risk Table                     Men   Women  1/2 Average Risk   3.4   3.3  Average Risk       5.0   4.4  2 X Average Risk   9.6   7.1  3 X Average Risk  23.4   11.0        Use the calculated Patient Ratio above and the CHD Risk Table to determine the patient's CHD Risk.        ATP III CLASSIFICATION (LDL):  <100     mg/dL   Optimal  834-196  mg/dL   Near or Above                    Optimal  130-159  mg/dL   Borderline  222-979  mg/dL   High  >892     mg/dL   Very High Performed at [facility], [address] [place], [place] [address]   Acetaminophen level     Status: Abnormal   Collection Time: 05/30/21  3:21 AM  Result Value Ref Range   Acetaminophen (Tylenol), Serum <10 (L) 10 - 30 ug/mL    Comment: (NOTE) Therapeutic concentrations vary significantly. A range of 10-30 ug/mL  may be an effective concentration for many patients. However, some  are best treated at concentrations outside of this range. Acetaminophen concentrations >150 ug/mL at 4 hours after ingestion  and >50 ug/mL at 12 hours after ingestion are often associated with  toxic reactions.  Performed at [facility], [address], [place], [place] [address]   Ethanol     Status: None   Collection Time: 05/30/21  3:21 AM  Result Value Ref Range   Alcohol, Ethyl (B) <10 <10 mg/dL    Comment: (NOTE) Lowest detectable limit for serum alcohol is 10 mg/dL.  For medical purposes only. Performed at [facility], [address], [place], [place] [address]   Salicylate level     Status: Abnormal   Collection Time: 05/30/21  3:21 AM  Result Value Ref Range   Salicylate Lvl <7.0 (L) 7.0 - 30.0 mg/dL    Comment: Performed at [facility], [address] [place], [place] [address]  Comprehensive metabolic panel     Status: Abnormal   Collection Time: 05/30/21  3:22 AM  Result Value Ref Range   Sodium 136 135 - 145 mmol/L   Potassium 3.6 3.5 - 5.1 mmol/L   Chloride 101 98 - 111 mmol/L   CO2 29 22 - 32 mmol/L   Glucose, Bld 88 70 - 99 mg/dL    Comment: Glucose reference range applies only to samples taken after fasting for at least 8 hours.   BUN 13 6 - 20 mg/dL   Creatinine, Ser 9.70 0.61 - 1.24 mg/dL   Calcium 8.6 (L) 8.9 - 10.3 mg/dL   Total Protein 6.6 6.5 - 8.1 g/dL   Albumin 3.3 (L) 3.5 - 5.0 g/dL   AST 22 15 - 41 U/L   ALT 15 0 - 44 U/L   Alkaline Phosphatase 42 38 - 126 U/L   Total Bilirubin 0.5 0.3 - 1.2 mg/dL   GFR, Estimated >26 >37 mL/min    Comment: (NOTE) Calculated using the CKD-EPI Creatinine Equation (2021)    Anion gap 6 5 - 15    Comment: Performed at [facility], [address] [place], [place]  [address]  CBC with Differential     Status: Abnormal   Collection Time: 05/30/21  3:22 AM  Result Value Ref Range   WBC 5.3 4.0 - 10.5 K/uL   RBC 4.09 (L) 4.22 - 5.81 MIL/uL   Hemoglobin 12.4 (L) 13.0 - 17.0 g/dL   HCT 22.6 (L) 33.3 - 54.5 %   MCV 93.9 80.0 - 100.0 fL   MCH 30.3 26.0 - 34.0 pg   MCHC 32.3 30.0 - 36.0 g/dL   RDW 62.5 63.8 - 93.7 %   Platelets 166 150 - 400 K/uL   nRBC 0.0 0.0 - 0.2 %   Neutrophils Relative % 51 %   Neutro Abs 2.8 1.7 - 7.7 K/uL   Lymphocytes Relative 31 %   Lymphs Abs 1.7 0.7 - 4.0 K/uL   Monocytes Relative 14 %   Monocytes Absolute 0.8 0.1 - 1.0 K/uL   Eosinophils Relative 2 %   Eosinophils Absolute 0.1 0.0 - 0.5 K/uL   Basophils Relative 1 %   Basophils Absolute 0.0 0.0 - 0.1 K/uL   Immature Granulocytes 1 %   Abs Immature Granulocytes 0.04 0.00 - 0.07 K/uL    Comment: Performed at [facility], [address] [place], [place] [address]  Valproic Acid level     Status: None   Collection Time: 06/24/21 12:08 PM  Result Value Ref Range   Valproic Acid Lvl 55.5 50.0 -  100.0 mg/L      PHQ2/9: Depression screen [facility] 2/9 07/14/2021 06/14/2021 01/19/2021 05/05/2020 03/23/2020  Decreased Interest 3 0 0 0 0  Down, Depressed, Hopeless 0 0 0 0 0  PHQ - 2 Score 3 0 0 0 0  Altered sleeping 3 0 - 0 0  Tired, decreased energy 0 0 - 0 0  Change in appetite 0 0 - 0 0  Feeling bad or failure about yourself  0 0 - 0 0  Trouble concentrating 0 0 - 0 0  Moving slowly or fidgety/restless 0 0 - 0 0  Suicidal thoughts 0 0 - 0 0  PHQ-9 Score 6 0 - 0 0  Difficult doing work/chores Somewhat difficult Not difficult at all - - Not difficult at all    phq 9 is positive   Fall Risk: Fall Risk  07/14/2021 06/14/2021 01/19/2021 10/29/2020 05/05/2020  Falls in the past year? 0 0 1 0 0  Number falls in past yr: - 0 0 0 0  Injury with Fall? - 0 0 0 0  Risk for fall due to : - - - No Fall Risks -  Risk for fall due to: Comment - - - - -  Follow up Falls prevention discussed - - Falls prevention discussed -      Functional Status Survey: Is the patient deaf or have difficulty hearing?: No Does the patient have difficulty seeing, even when wearing glasses/contacts?: No Does the patient have difficulty concentrating, remembering, or making decisions?: Yes Does the patient have difficulty walking or climbing stairs?: No Does the patient have difficulty dressing or bathing?: Yes Does the patient have difficulty doing errands alone such as visiting a doctor's office or shopping?: Yes    Assessment & Plan  1. Autism  Keep follow up with psychiatrist   2. Anxiety   3. Anemia, unspecified type  Likely iron deficiency, we will try iron tablets and recheck next visit   4. Seizure (HCC)  On depakote  5. Cough  - benzonatate (TESSALON) 100 MG capsule; Take 1-2 capsules (  100-200 mg total) by mouth 2 (two) times daily as needed.  Dispense: 40 capsule; Refill: 0  6. Needs flu shot  - Flu Vaccine QUAD 6+ mos PF IM (Fluarix Quad PF)  7. Excoriation of left upper arm,  initial encounter   8. Need for Tdap vaccination  - Tdap vaccine greater than or equal to 7yo IM  9. Need for shingles vaccine  - Zoster Vaccine Adjuvanted [facility]) injection; Inject 0.5 mLs into the muscle once for 1 dose.  Dispense: 0.5 mL; Refill: 1

## 2021-07-14 ENCOUNTER — Other Ambulatory Visit: Payer: Self-pay

## 2021-07-14 ENCOUNTER — Ambulatory Visit (INDEPENDENT_AMBULATORY_CARE_PROVIDER_SITE_OTHER): Payer: Medicare Other | Admitting: Family Medicine

## 2021-07-14 ENCOUNTER — Encounter: Payer: Self-pay | Admitting: Family Medicine

## 2021-07-14 VITALS — BP 118/72 | HR 93 | Temp 99.1°F | Resp 18 | Ht 69.0 in | Wt 158.1 lb

## 2021-07-14 DIAGNOSIS — F419 Anxiety disorder, unspecified: Secondary | ICD-10-CM

## 2021-07-14 DIAGNOSIS — F840 Autistic disorder: Secondary | ICD-10-CM

## 2021-07-14 DIAGNOSIS — S40812A Abrasion of left upper arm, initial encounter: Secondary | ICD-10-CM

## 2021-07-14 DIAGNOSIS — D649 Anemia, unspecified: Secondary | ICD-10-CM | POA: Diagnosis not present

## 2021-07-14 DIAGNOSIS — R569 Unspecified convulsions: Secondary | ICD-10-CM

## 2021-07-14 DIAGNOSIS — Z23 Encounter for immunization: Secondary | ICD-10-CM

## 2021-07-14 DIAGNOSIS — R059 Cough, unspecified: Secondary | ICD-10-CM | POA: Diagnosis not present

## 2021-07-14 MED ORDER — BENZONATATE 100 MG PO CAPS: 100.0000 mg | ORAL_CAPSULE | Freq: Two times a day (BID) | ORAL | 0 refills | Status: DC | PRN

## 2021-07-14 MED ORDER — SHINGRIX 50 MCG/0.5ML IM SUSR: 0.5000 mL | Freq: Once | INTRAMUSCULAR | 1 refills | Status: AC

## 2021-07-29 ENCOUNTER — Other Ambulatory Visit: Payer: Self-pay | Admitting: Family Medicine

## 2021-07-29 DIAGNOSIS — R059 Cough, unspecified: Secondary | ICD-10-CM

## 2021-08-23 ENCOUNTER — Other Ambulatory Visit: Payer: Self-pay | Admitting: Family Medicine

## 2021-08-23 DIAGNOSIS — R059 Cough, unspecified: Secondary | ICD-10-CM

## 2021-08-24 ENCOUNTER — Other Ambulatory Visit: Payer: Self-pay | Admitting: Family Medicine

## 2021-08-24 DIAGNOSIS — R059 Cough, unspecified: Secondary | ICD-10-CM

## 2021-08-25 DIAGNOSIS — F99 Mental disorder, not otherwise specified: Secondary | ICD-10-CM | POA: Diagnosis not present

## 2021-08-25 DIAGNOSIS — F79 Unspecified intellectual disabilities: Secondary | ICD-10-CM | POA: Diagnosis not present

## 2021-08-25 DIAGNOSIS — R631 Polydipsia: Secondary | ICD-10-CM | POA: Diagnosis not present

## 2021-08-25 DIAGNOSIS — F5105 Insomnia due to other mental disorder: Secondary | ICD-10-CM | POA: Diagnosis not present

## 2021-08-25 DIAGNOSIS — F840 Autistic disorder: Secondary | ICD-10-CM | POA: Diagnosis not present

## 2021-08-30 ENCOUNTER — Other Ambulatory Visit: Payer: Self-pay | Admitting: Family Medicine

## 2021-08-30 DIAGNOSIS — R059 Cough, unspecified: Secondary | ICD-10-CM

## 2021-11-25 ENCOUNTER — Ambulatory Visit (INDEPENDENT_AMBULATORY_CARE_PROVIDER_SITE_OTHER): Payer: Medicare Other

## 2021-11-25 DIAGNOSIS — Z0000 Encounter for general adult medical examination without abnormal findings: Secondary | ICD-10-CM

## 2021-11-25 NOTE — Patient Instructions (Signed)
[name] , Thank you for taking time to come for your Medicare Wellness Visit. I appreciate your ongoing commitment to your health goals. Please review the following plan we discussed and let me know if I can assist you in the future.   Screening recommendations/referrals: Colonoscopy: declined Recommended yearly ophthalmology/optometry visit for glaucoma screening and checkup Recommended yearly dental visit for hygiene and checkup  Vaccinations: Influenza vaccine: done 07/14/21 Pneumococcal vaccine: done 05/10/12 Tdap vaccine: done 07/14/21 Shingles vaccine: Shingrix discussed. Please contact your pharmacy for coverage information.  Covid-19:  done 03/24/20, 04/17/20 & 12/18/20  Advanced directives: Please bring a copy of your health care power of attorney and living will to the office at your convenience.   Conditions/risks identified: Recommend drinking 6-8 glasses of water per day   Next appointment: Follow up in one year for your annual wellness visit   Preventive Care 40-64 Years, Male Preventive care refers to lifestyle choices and visits with your health care provider that can promote health and wellness. What does preventive care include? A yearly physical exam. This is also called an annual well check. Dental exams once or twice a year. Routine eye exams. Ask your health care provider how often you should have your eyes checked. Personal lifestyle choices, including: Daily care of your teeth and gums. Regular physical activity. Eating a healthy diet. Avoiding tobacco and drug use. Limiting alcohol use. Practicing safe sex. Taking low-dose aspirin every day starting at age 31. What happens during an annual well check? The services and screenings done by your health care provider during your annual well check will depend on your age, overall health, lifestyle risk factors, and family history of disease. Counseling  Your health care provider may ask you questions about  your: Alcohol use. Tobacco use. Drug use. Emotional well-being. Home and relationship well-being. Sexual activity. Eating habits. Work and work Astronomer. Screening  You may have the following tests or measurements: Height, weight, and BMI. Blood pressure. Lipid and cholesterol levels. These may be checked every 5 years, or more frequently if you are over 28 years old. Skin check. Lung cancer screening. You may have this screening every year starting at age 72 if you have a 30-pack-year history of smoking and currently smoke or have quit within the past 15 years. Fecal occult blood test (FOBT) of the stool. You may have this test every year starting at age 77. Flexible sigmoidoscopy or colonoscopy. You may have a sigmoidoscopy every 5 years or a colonoscopy every 10 years starting at age 59. Prostate cancer screening. Recommendations will vary depending on your family history and other risks. Hepatitis C blood test. Hepatitis B blood test. Sexually transmitted disease (STD) testing. Diabetes screening. This is done by checking your blood sugar (glucose) after you have not eaten for a while (fasting). You may have this done every 1-3 years. Discuss your test results, treatment options, and if necessary, the need for more tests with your health care provider. Vaccines  Your health care provider may recommend certain vaccines, such as: Influenza vaccine. This is recommended every year. Tetanus, diphtheria, and acellular pertussis (Tdap, Td) vaccine. You may need a Td booster every 10 years. Zoster vaccine. You may need this after age 68. Pneumococcal 13-valent conjugate (PCV13) vaccine. You may need this if you have certain conditions and have not been vaccinated. Pneumococcal polysaccharide (PPSV23) vaccine. You may need one or two doses if you smoke cigarettes or if you have certain conditions. Talk to your health care provider  about which screenings and vaccines you need and how  often you need them. This information is not intended to replace advice given to you by your health care provider. Make sure you discuss any questions you have with your health care provider. Document Released: 11/13/2015 Document Revised: 07/06/2016 Document Reviewed: 08/18/2015 Elsevier Interactive Patient Education  2017 [employer] Prevention in the Home Falls can cause injuries. They can happen to people of all ages. There are many things you can do to make your home safe and to help prevent falls. What can I do on the outside of my home? Regularly fix the edges of walkways and driveways and fix any cracks. Remove anything that might make you trip as you walk through a door, such as a raised step or threshold. Trim any bushes or trees on the path to your home. Use bright outdoor lighting. Clear any walking paths of anything that might make someone trip, such as rocks or tools. Regularly check to see if handrails are loose or broken. Make sure that both sides of any steps have handrails. Any raised decks and porches should have guardrails on the edges. Have any leaves, snow, or ice cleared regularly. Use sand or salt on walking paths during winter. Clean up any spills in your garage right away. This includes oil or grease spills. What can I do in the bathroom? Use night lights. Install grab bars by the toilet and in the tub and shower. Do not use towel bars as grab bars. Use non-skid mats or decals in the tub or shower. If you need to sit down in the shower, use a plastic, non-slip stool. Keep the floor dry. Clean up any water that spills on the floor as soon as it happens. Remove soap buildup in the tub or shower regularly. Attach bath mats securely with double-sided non-slip rug tape. Do not have throw rugs and other things on the floor that can make you trip. What can I do in the bedroom? Use night lights. Make sure that you have a light by your bed that is easy to  reach. Do not use any sheets or blankets that are too big for your bed. They should not hang down onto the floor. Have a firm chair that has side arms. You can use this for support while you get dressed. Do not have throw rugs and other things on the floor that can make you trip. What can I do in the kitchen? Clean up any spills right away. Avoid walking on wet floors. Keep items that you use a lot in easy-to-reach places. If you need to reach something above you, use a strong step stool that has a grab bar. Keep electrical cords out of the way. Do not use floor polish or wax that makes floors slippery. If you must use wax, use non-skid floor wax. Do not have throw rugs and other things on the floor that can make you trip. What can I do with my stairs? Do not leave any items on the stairs. Make sure that there are handrails on both sides of the stairs and use them. Fix handrails that are broken or loose. Make sure that handrails are as long as the stairways. Check any carpeting to make sure that it is firmly attached to the stairs. Fix any carpet that is loose or worn. Avoid having throw rugs at the top or bottom of the stairs. If you do have throw rugs, attach them to the floor  with carpet tape. Make sure that you have a light switch at the top of the stairs and the bottom of the stairs. If you do not have them, ask someone to add them for you. What else can I do to help prevent falls? Wear shoes that: Do not have high heels. Have rubber bottoms. Are comfortable and fit you well. Are closed at the toe. Do not wear sandals. If you use a stepladder: Make sure that it is fully opened. Do not climb a closed stepladder. Make sure that both sides of the stepladder are locked into place. Ask someone to hold it for you, if possible. Clearly mark and make sure that you can see: Any grab bars or handrails. First and last steps. Where the edge of each step is. Use tools that help you move  around (mobility aids) if they are needed. These include: Canes. Walkers. Scooters. Crutches. Turn on the lights when you go into a dark area. Replace any light bulbs as soon as they burn out. Set up your furniture so you have a clear path. Avoid moving your furniture around. If any of your floors are uneven, fix them. If there are any pets around you, be aware of where they are. Review your medicines with your doctor. Some medicines can make you feel dizzy. This can increase your chance of falling. Ask your doctor what other things that you can do to help prevent falls. This information is not intended to replace advice given to you by your health care provider. Make sure you discuss any questions you have with your health care provider. Document Released: 08/13/2009 Document Revised: 03/24/2016 Document Reviewed: 11/21/2014 Elsevier Interactive Patient Education  2017 [employer].

## 2021-11-25 NOTE — Progress Notes (Signed)
Subjective:   Erik Henderson is a 61 y.o. male who presents for Medicare Annual/Subsequent preventive examination.  Virtual Visit via Telephone Note  I connected with  Erik Henderson on 11/25/21 at  9:20 AM EST by telephone and verified that I am speaking with the correct person using two identifiers.  Location: Patient: home Provider: [facility] Persons participating in the virtual visit: patient & sister Erik Henderson Gov Erik Henderson   I discussed the limitations, risks, security and privacy concerns of performing an evaluation and management service by telephone and the availability of in person appointments. The patient expressed understanding and agreed to proceed.  Interactive audio and video telecommunications were attempted between this nurse and patient, however failed, due to patient having technical difficulties OR patient did not have access to video capability.  We continued and completed visit with audio only.  Some vital signs may be absent or patient reported.   Reather Littler, LPN   Review of Systems     Cardiac Risk Factors include: male gender;dyslipidemia     Objective:    There were no vitals filed for this visit. There is no height or weight on file to calculate BMI.  Advanced Directives 11/25/2021 10/29/2020 03/14/2019 03/09/2018 08/07/2017 11/15/2016 09/06/2016  Does Patient Have a Medical Advance Directive? Yes Yes Yes Yes No Yes Yes  Type of Social research officer, government Power of [employer] Power of Attorney - Healthcare Power of Attorney Living will;Healthcare Power of Attorney  Does patient want to make changes to medical advance directive? - - - - - - No - Patient declined  Copy of Healthcare Power of Attorney in Chart? No - copy requested No - copy requested No - copy requested No - copy requested - - No - copy requested    Current Medications (verified) Outpatient Encounter Medications as  of 11/25/2021  Medication Sig   aspirin EC 81 MG tablet Take 1 tablet (81 mg total) by mouth daily. Swallow whole.   atorvastatin (LIPITOR) 20 MG tablet TAKE 1 TABLET BY MOUTH ONCE DAILY.   benzonatate (TESSALON) 100 MG capsule TAKE 1 OR 2 CAPSULES BY MOUTH TWICE DAILY AS NEEDED   clonazePAM (KLONOPIN) 0.5 MG tablet Take 1 tablet by mouth daily as needed.   divalproex (DEPAKOTE) 250 MG DR tablet Take 250-750 mg by mouth 3 (three) times daily. 1 breakfast, 1 lunch and 3 at night    GNP MELATONIN 3 MG TABS Take 1 tablet by mouth every evening.   traZODone (DESYREL) 100 MG tablet TAKE (1) TABLET BY MOUTH DAILY AT BEDTIME FOR SLEEP.   escitalopram (LEXAPRO) 20 MG tablet Take 1 tablet by mouth daily.   [DISCONTINUED] COVID-19 mRNA Vac-TriS, Pfizer, SUSP injection USE AS DIRECTED   [DISCONTINUED] traZODone (DESYREL) 50 MG tablet Take 0.5-1 tablets by mouth daily.   No facility-administered encounter medications on file as of 11/25/2021.    Allergies (verified) Pollen extract   History: Past Medical History:  Diagnosis Date   Allergy    Anxiety    Autism    Autistic disorder    Mental disorder    Mental retardation    Past Surgical History:  Procedure Laterality Date   NO PAST SURGERIES     Family History  Problem Relation Age of Onset   Hypertension Mother    Hyperlipidemia Mother    Diabetes Mother    Cancer Mother        stomach   Heart disease  Mother    Coronary artery disease Father    Gout Father    Heart failure Father    Cancer Father        prostate   Hypertension Brother    Autism Brother    Mental retardation Brother    Social History   Socioeconomic History   Marital status: Single    Spouse name: Not on file   Number of children: 0   Years of education: Not on file   Highest education level: Never attended school  Occupational History   Occupation: disabled  Tobacco Use   Smoking status: Never   Smokeless tobacco: Never   Tobacco comments:     smoking cessation materials not required  Vaping Use   Vaping Use: Never used  Substance and Sexual Activity   Alcohol use: No    Alcohol/week: 0.0 standard drinks   Drug use: No   Sexual activity: Never  Other Topics Concern   Not on file  Social History Narrative   Lives with mother, brothers and sisters; sisters are caregivers; mother is guardian/POA -- family discussing transfer of guardianship/POA to sister as parents in poor health   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                   Social Determinants of Health   Financial Resource Strain: Low Risk    Difficulty of Paying Living Expenses: Not very hard  Food Insecurity: No Food Insecurity   Worried About Programme researcher, broadcasting/film/video in the Last Year: Never true   Ran Out of Food in the Last Year: Never true  Transportation Needs: No Transportation Needs   Lack of Transportation (Medical): No   Lack of Transportation (Non-Medical): No  Physical Activity: Inactive   Days of Exercise per Week: 0 days   Minutes of Exercise per Session: 0 min  Stress: No Stress Concern Present   Feeling of Stress : Only a little  Social Connections: Socially Isolated   Frequency of Communication with Friends and Family: Never   Frequency of Social Gatherings with Friends and Family: Never   Attends Religious Services: Never   Diplomatic Services operational officer: No   Attends Engineer, structural: Never   Marital Status: Never married    Tobacco Counseling Counseling given: Not Answered Tobacco comments: smoking cessation materials not required   Clinical Intake:  Pre-visit preparation completed: Yes  Pain : No/denies pain     Nutritional Risks: None Diabetes: No  How often do you need to have someone help you when you read instructions, pamphlets, or other written materials from your doctor or pharmacy?: 5 - Always    Interpreter Needed?: No  Information entered by :: Reather Littler LPN   Activities of Daily Living In your present state of health, do you have any difficulty performing the following activities: 11/25/2021 07/14/2021  Hearing? N N  Vision? N N  Difficulty concentrating or making decisions? Malvin Johns  Walking or climbing stairs? N N  Dressing or bathing? Y Y  Doing errands, shopping? Malvin Johns  Preparing Food and eating ? N -  Using the Toilet? Y -  In the past six months, have you accidently leaked urine? N -  Do you have problems with loss of bowel control? N -  Managing your Medications? Y -  Managing your Finances? Y -  Housekeeping or managing your Housekeeping? Y -  Some recent data  might be hidden    Patient Care Team: Alba CorySowles, Krichna, MD as PCP - General (Family Medicine) Dennison BullaMorris, Gwendolyn B, MD as Consulting Physician (Psychiatry) End, Cristal Deerhristopher, MD as Consulting Physician (Cardiology)  Indicate any recent Medical Services you may have received from other than [facility] providers in the past year (date may be approximate).     Assessment:   This is a routine wellness examination for Dorene SorrowJerry.  Hearing/Vision screen Hearing Screening - Comments:: Pt denies hearing difficulty Vision Screening - Comments:: Pt is past due for eye exam; not established with provider  Dietary issues and exercise activities discussed: Current Exercise Habits: The patient does not participate in regular exercise at present, Exercise limited by: psychological condition(s)   Goals Addressed             This Visit's Progress    DIET - INCREASE WATER INTAKE   On track    Recommend to drink at least 6-8 8oz glasses of water per day.       Depression Screen PHQ 2/9 Scores 11/25/2021 07/14/2021 06/14/2021 01/19/2021 10/29/2020 05/05/2020 03/23/2020  PHQ - 2 Score 0 3 0 0 - 0 0  PHQ- 9 Score - 6 0 - - 0 0  Exception Documentation - - - - Medical reason - -  Not completed - - - - - - -    Fall Risk Fall Risk  11/25/2021 07/14/2021 06/14/2021 01/19/2021 10/29/2020  Falls  in the past year? 0 0 0 1 0  Number falls in past yr: 0 - 0 0 0  Injury with Fall? 0 - 0 0 0  Risk for fall due to : No Fall Risks - - - No Fall Risks  Risk for fall due to: Comment - - - - -  Follow up Falls prevention discussed Falls prevention discussed - - Falls prevention discussed    FALL RISK PREVENTION PERTAINING TO THE HOME:  Any stairs in or around the home? Yes  If so, are there any without handrails? No  Home free of loose throw rugs in walkways, pet beds, electrical cords, etc? Yes  Adequate lighting in your home to reduce risk of falls? Yes   ASSISTIVE DEVICES UTILIZED TO PREVENT FALLS:  Life alert? No  Use of a cane, walker or w/c? No  Grab bars in the bathroom? Yes  Shower chair or bench in shower? Yes  Elevated toilet seat or a handicapped toilet? No   TIMED UP AND GO:  Was the test performed? No . Telephonic visit.   Cognitive Function: Cognitive status assessed by direct observation. Patient has current diagnosis of cognitive impairment. Patient is unable to complete screening 6CIT or MMSE.          Immunizations Immunization History  Administered Date(s) Administered   Influenza, Seasonal, Injecte, Preservative Fre 10/10/2011, 10/11/2012   Influenza,inj,Quad PF,6+ Mos 11/04/2013, 08/05/2014, 08/17/2015, 09/06/2016, 08/07/2017, 06/20/2018, 09/20/2019, 09/14/2020, 07/14/2021   PFIZER Comirnaty(Gray Top)Covid-19 Tri-Sucrose Vaccine 12/18/2020   PFIZER(Purple Top)SARS-COV-2 Vaccination 03/24/2020, 04/17/2020   Pneumococcal Polysaccharide-23 05/10/2012   Tdap 05/10/2011, 07/14/2021    TDAP status: Up to date  Flu Vaccine status: Up to date  Pneumococcal vaccine status: Up to date  Covid-19 vaccine status: Completed vaccines  Qualifies for Shingles Vaccine? Yes   Zostavax completed No   Shingrix Completed?: No.    Education has been provided regarding the importance of this vaccine. Patient has been advised to call insurance company to determine out  of pocket expense if they have not yet received  this vaccine. Advised may also receive vaccine at local pharmacy or Health Dept. Verbalized acceptance and understanding.  Screening Tests Health Maintenance  Topic Date Due   Zoster Vaccines- Shingrix (1 of 2) Never done   COVID-19 Vaccine (4 - Booster for Pfizer series) 02/12/2021   COLONOSCOPY (Pts 45-1554yrs Insurance coverage will need to be confirmed)  07/14/2022 (Originally 05/22/2006)   HIV Screening  08/10/2029 (Originally 05/22/1976)   TETANUS/TDAP  07/15/2031   INFLUENZA VACCINE  Completed   Hepatitis C Screening  Completed   HPV VACCINES  Aged Out    Health Maintenance  Health Maintenance Due  Topic Date Due   Zoster Vaccines- Shingrix (1 of 2) Never done   COVID-19 Vaccine (4 - Booster for Pfizer series) 02/12/2021    Colorectal cancer screening: declined  Lung Cancer Screening: (Low Dose CT Chest recommended if Age 77-80 years, 30 pack-year currently smoking OR have quit w/in 15years.) does not qualify.   Additional Screening:  Hepatitis C Screening: does qualify; Completed 06/06/16  Vision Screening: Recommended annual ophthalmology exams for early detection of glaucoma and other disorders of the eye. Is the patient up to date with their annual eye exam?  No  Who is the provider or what is the name of the office in which the patient attends annual eye exams? Not established If pt is not established with a provider, would they like to be referred to a provider to establish care? No .   Dental Screening: Recommended annual dental exams for proper oral hygiene  Community Resource Referral / Chronic Care Management: CRR required this visit?  No   CCM required this visit?  No      Plan:     I have personally reviewed and noted the following in the patients chart:   Medical and social history Use of alcohol, tobacco or illicit drugs  Current medications and supplements including opioid prescriptions. Patient is  not currently taking opioid prescriptions. Functional ability and status Nutritional status Physical activity Advanced directives List of other physicians Hospitalizations, surgeries, and ER visits in previous 12 months Vitals Screenings to include cognitive, depression, and falls Referrals and appointments  In addition, I have reviewed and discussed with patient certain preventive protocols, quality metrics, and best practice recommendations. A written personalized care plan for preventive services as well as general preventive health recommendations were provided to patient.     Reather LittlerKasey Elleah Hemsley, LPN   1/61/09601/26/2023   Nurse Notes: none

## 2022-01-10 NOTE — Progress Notes (Unsigned)
Name: Erik Henderson   MRN: [id number]    DOB: Jan 28, 1961   Date:01/10/2022       Progress Note  Subjective  Chief Complaint  Follow Up  HPI  Seizure disorder: caregivers states he has been seizure free for many years, but they are afraid of stopping medication. He is getting valproic acid from psychiatrist from [facility], his last level was within normal range.    Insulin Resistance:  he is on diabetic diet, he has lost 5 lbs since last visit , he has lost weight since off Risperdal, no change in appetite, no polyuria  Cough: mother and daughter are here with him today. He has been a little congested and has a wet cough, no fever or chills. Appetite has been normal and also his behavior. He had COVID -19 about one month ago but his symptoms resolved. Family members think secondary to weather change, advised nasal saline, mucinex otc and benzonatate, return if fever or change in behavior.     Dyslipidemia: on life style modification, reviewed last labs    Autism spectrum disorder: recent change in behavior intermittently, he is now under the care of psychiatrist, plus clonazepam 0.5 prn and depakote given by psychiatrist    Anemia: he is not a good candidate for colon cancer screen, family states it will be difficulty to have colonoscopy and were not able to do the hemoccult cards or cologuard. They are willing to start giving him iron supplements otc.    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   Seizure (HCC) 04/16/2019   Behavior concern in adult 09/22/2018   Behavioral change 06/27/2018   Intellectual disability 06/27/2018   Hyperglycemia 08/17/2015   Anxiety 04/14/2015   Hyperlipidemia LDL goal <70  04/14/2015   Cognitive impairment 04/14/2015   Autism spectrum disorder 02/23/2015   Hay fever 02/21/2008    Past Surgical History:  Procedure Laterality Date   NO PAST SURGERIES      Family History  Problem Relation Age of Onset   Hypertension Mother    Hyperlipidemia Mother    Diabetes Mother    Cancer Mother        stomach   Heart disease Mother    Coronary artery disease Father    Gout Father    Heart failure Father    Cancer Father        prostate   Hypertension Brother    Autism Brother    Mental retardation Brother     Social History   Tobacco Use   Smoking status: Never   Smokeless tobacco: Never   Tobacco comments:    smoking cessation materials not required  Substance Use Topics   Alcohol use: No    Alcohol/week: 0.0 standard drinks     Current Outpatient Medications:    aspirin EC 81 MG tablet, Take 1 tablet (81 mg total) by mouth daily. Swallow whole., Disp: , Rfl:    atorvastatin (LIPITOR) 20 MG tablet, TAKE 1 TABLET BY MOUTH ONCE DAILY., Disp: 30 tablet, Rfl: 11   benzonatate (TESSALON) 100 MG capsule, TAKE 1 OR 2 CAPSULES BY MOUTH TWICE DAILY AS NEEDED, Disp: 40 capsule, Rfl: 0   clonazePAM (KLONOPIN) 0.5 MG tablet,  Take 1 tablet by mouth daily as needed., Disp: , Rfl:    divalproex (DEPAKOTE) 250 MG DR tablet, Take 250-750 mg by mouth 3 (three) times daily. 1 breakfast, 1 lunch and 3 at night , Disp: , Rfl:    escitalopram (LEXAPRO) 20 MG tablet, Take 1 tablet by mouth daily., Disp: , Rfl:    GNP MELATONIN 3 MG TABS, Take 1 tablet by mouth every evening., Disp: , Rfl:    traZODone (DESYREL) 100 MG tablet, TAKE (1) TABLET BY MOUTH DAILY AT BEDTIME FOR SLEEP., Disp: , Rfl:   Allergies  Allergen Reactions   Pollen Extract     Other reaction(s): Other (See Comments) Patent allergic to dust, that causes pt to sneeze.    I personally reviewed active problem list, medication list, allergies, family history, social history, health maintenance with  the patient/caregiver today.   ROS  ***  Objective  There were no vitals filed for this visit.  There is no height or weight on file to calculate BMI.  Physical Exam ***  No results found for this or any previous visit (from the past 2160 hour(s)).   PHQ2/9: Depression screen [facility] 2/9 11/25/2021 07/14/2021 06/14/2021 01/19/2021 05/05/2020  Decreased Interest 0 3 0 0 0  Down, Depressed, Hopeless 0 0 0 0 0  PHQ - 2 Score 0 3 0 0 0  Altered sleeping - 3 0 - 0  Tired, decreased energy - 0 0 - 0  Change in appetite - 0 0 - 0  Feeling bad or failure about yourself  - 0 0 - 0  Trouble concentrating - 0 0 - 0  Moving slowly or fidgety/restless - 0 0 - 0  Suicidal thoughts - 0 0 - 0  PHQ-9 Score - 6 0 - 0  Difficult doing work/chores - Somewhat difficult Not difficult at all - -    phq 9 is {gen pos [id number]}   Fall Risk: Fall Risk  11/25/2021 07/14/2021 06/14/2021 01/19/2021 10/29/2020  Falls in the past year? 0 0 0 1 0  Number falls in past yr: 0 - 0 0 0  Injury with Fall? 0 - 0 0 0  Risk for fall due to : No Fall Risks - - - No Fall Risks  Risk for fall due to: Comment - - - - -  Follow up Falls prevention discussed Falls prevention discussed - - Falls prevention discussed      Functional Status Survey:      Assessment & Plan  *** There are no diagnoses linked to this encounter.

## 2022-01-11 ENCOUNTER — Ambulatory Visit (INDEPENDENT_AMBULATORY_CARE_PROVIDER_SITE_OTHER): Payer: Medicare Other | Admitting: Family Medicine

## 2022-01-11 ENCOUNTER — Encounter: Payer: Self-pay | Admitting: Family Medicine

## 2022-01-11 VITALS — BP 122/70 | HR 85 | Resp 16 | Ht 69.0 in | Wt 171.0 lb

## 2022-01-11 DIAGNOSIS — Z23 Encounter for immunization: Secondary | ICD-10-CM | POA: Diagnosis not present

## 2022-01-11 DIAGNOSIS — D649 Anemia, unspecified: Secondary | ICD-10-CM

## 2022-01-11 DIAGNOSIS — F419 Anxiety disorder, unspecified: Secondary | ICD-10-CM

## 2022-01-11 DIAGNOSIS — R569 Unspecified convulsions: Secondary | ICD-10-CM

## 2022-01-11 DIAGNOSIS — E538 Deficiency of other specified B group vitamins: Secondary | ICD-10-CM

## 2022-01-11 DIAGNOSIS — F840 Autistic disorder: Secondary | ICD-10-CM

## 2022-01-11 MED ORDER — SHINGRIX 50 MCG/0.5ML IM SUSR: 0.5000 mL | Freq: Once | INTRAMUSCULAR | 1 refills | Status: AC

## 2022-01-11 MED ORDER — B-12 1000 MCG SL SUBL: 1.0000 | SUBLINGUAL_TABLET | SUBLINGUAL | 5 refills | Status: DC

## 2022-01-12 ENCOUNTER — Other Ambulatory Visit: Payer: Self-pay

## 2022-01-12 ENCOUNTER — Encounter: Payer: Self-pay | Admitting: Internal Medicine

## 2022-01-12 ENCOUNTER — Ambulatory Visit (INDEPENDENT_AMBULATORY_CARE_PROVIDER_SITE_OTHER): Payer: Medicare Other | Admitting: Internal Medicine

## 2022-01-12 VITALS — BP 120/66 | HR 62 | Ht 69.0 in | Wt 172.0 lb

## 2022-01-12 DIAGNOSIS — R9431 Abnormal electrocardiogram [ECG] [EKG]: Secondary | ICD-10-CM

## 2022-01-12 DIAGNOSIS — R778 Other specified abnormalities of plasma proteins: Secondary | ICD-10-CM | POA: Diagnosis not present

## 2022-01-12 DIAGNOSIS — E785 Hyperlipidemia, unspecified: Secondary | ICD-10-CM | POA: Diagnosis not present

## 2022-01-12 NOTE — Progress Notes (Signed)
? ?Follow-up Outpatient Visit ?Date: 01/12/2022 ? ?Primary Care Provider: ?Steele Sizer, MD ?[address] ?[place] [address] ? ?Chief Complaint: Follow-up coronary artery disease ? ?HPI:  Mr. Gambler is a 61 y.o. male with history of intellectual disability with autism spectrum disorder, OCD (per his family), mutism, and seizures, who presents for follow-up of suspected coronary artery disease.  He was last seen in our office in 07/2021 by Cadence Furth, PA, at which time he seemed to be doing well from a heart standpoint (most history provided by his family members).  Further cardiac testing was again deferred. ? ?History is again provided by Mr. Stellhorn family, as he does not answer questions himself.  His family has not noticed anything concerning to suggest that Mr. Sama has been experiencing chest pain or shortness of breath.  He has not had any swelling.  He is tolerating his medications well. ? ?-------------------------------------------------------------------------------------------------- ? ?Cardiovascular History & Procedures: ?Cardiovascular Problems: ?Elevated troponin ?  ?Risk Factors: ?Male gender and age greater than 7 ?  ?Cath/PCI: ?None ?  ?CV Surgery: ?None ?  ?EP Procedures and Devices: ?None ?  ?Non-Invasive Evaluation(s): ?TTE (01/08/2021, [facility]): Normal LV size and wall thickness.  LVEF greater than 55%.  Normal RV size and function.  No significant valvular abnormality. ? ?Recent CV Pertinent Labs: ?Lab Results  ?Component Value Date  ? CHOL 134 05/14/2021  ? HDL 50 05/14/2021  ? [place] 70 05/14/2021  ? LDLCALC 101 (H) 04/16/2019  ? TRIG 69 05/14/2021  ? CHOLHDL 2.7 05/14/2021  ? K 3.6 05/30/2021  ? BUN 13 05/30/2021  ? CREATININE 0.84 05/30/2021  ? CREATININE 0.99 02/20/2020  ? ? ?Past medical and surgical history were reviewed and updated in EPIC. ? ?Current Meds  ?Medication Sig  ? aspirin EC 81 MG tablet Take 1 tablet (81 mg total) by mouth daily. Swallow whole.  ?  atorvastatin (LIPITOR) 20 MG tablet TAKE 1 TABLET BY MOUTH ONCE DAILY.  ? clonazePAM (KLONOPIN) 0.5 MG tablet Take 1 tablet by mouth daily as needed.  ? Cyanocobalamin (B-12) 1000 MCG SUBL Place 1 tablet under the tongue 3 (three) times a week.  ? divalproex (DEPAKOTE) 250 MG DR tablet Take 250-750 mg by mouth 3 (three) times daily. 1 breakfast, 1 lunch and 3 at night   ? escitalopram (LEXAPRO) 20 MG tablet Take 1 tablet by mouth daily.  ? traZODone (DESYREL) 100 MG tablet TAKE (1) TABLET BY MOUTH DAILY AT BEDTIME FOR SLEEP.  ? ? ?Allergies: Pollen extract ? ?Social History  ? ?Tobacco Use  ? Smoking status: Never  ? Smokeless tobacco: Never  ? Tobacco comments:  ?  smoking cessation materials not required  ?Vaping Use  ? Vaping Use: Never used  ?Substance Use Topics  ? Alcohol use: No  ?  Alcohol/week: 0.0 standard drinks  ? Drug use: No  ? ? ?Family History  ?Problem Relation Age of Onset  ? Hypertension Mother   ? Hyperlipidemia Mother   ? Diabetes Mother   ? Cancer Mother   ?     stomach  ? Heart disease Mother   ? Coronary artery disease Father   ? Gout Father   ? Heart failure Father   ? Cancer Father   ?     prostate  ? Hypertension Brother   ? Autism Brother   ? Mental retardation Brother   ? Stroke Brother   ? ? ?Review of Systems: ?A 12-system review of systems was performed  and was negative except as noted in the HPI. ? ?-------------------------------------------------------------------------------------------------- ? ?Physical Exam: ?BP 120/66 (BP Location: Left Arm, Patient Position: Sitting, Cuff Size: Normal)   Pulse 62   Ht 5\' 9"  (1.753 m)   Wt 172 lb (78 kg)   SpO2 98%   BMI 25.40 kg/m?  ? ?General:  NAD.  Accompanied by his family. ?Neck: No JVD or HJR. ?Lungs: Clear to auscultation bilaterally without wheezes or crackles. ?Heart: Regular rate and rhythm without murmurs, rubs, or gallops. ?Abdomen: Soft, nontender, nondistended. ?Extremities: No lower extremity edema. ? ?EKG: Normal sinus  rhythm with nonspecific T wave changes.  Anterolateral T wave inversions are less pronounced compared to the prior tracing from 07/09/2021. ? ?Lab Results  ?Component Value Date  ? WBC 5.3 05/30/2021  ? HGB 12.4 (L) 05/30/2021  ? HCT 38.4 (L) 05/30/2021  ? MCV 93.9 05/30/2021  ? PLT 166 05/30/2021  ? ? ?Lab Results  ?Component Value Date  ? NA 136 05/30/2021  ? K 3.6 05/30/2021  ? CL 101 05/30/2021  ? CO2 29 05/30/2021  ? BUN 13 05/30/2021  ? CREATININE 0.84 05/30/2021  ? GLUCOSE 88 05/30/2021  ? ALT 15 05/30/2021  ? ? ?Lab Results  ?Component Value Date  ? CHOL 134 05/14/2021  ? HDL 50 05/14/2021  ? [place] 70 05/14/2021  ? TRIG 69 05/14/2021  ? CHOLHDL 2.7 05/14/2021  ? ? ?-------------------------------------------------------------------------------------------------- ? ?ASSESSMENT AND PLAN: ?Abnormal EKG and elevated troponin: ?It is difficult to ascertain symptoms from Mr. Painton, though his family feels like he has been in his usual state of health without any obvious distress.  Echocardiogram last year at [facility] showed no wall motion abnormality or cardiomyopathy.  We previously discussed ischemia evaluation but agreed to defer this given challenges associated with his intellectual disability and difficulty remaining still for procedures.  We will continue aspirin and atorvastatin for secondary prevention of presumed underlying CAD. ? ?Hyperlipidemia: ?Lipids adequately controlled on last check in 04/2021.  Continue atorvastatin 20 mg daily. ? ?Follow-up: Return to clinic in 1 year, sooner if concerns arise. ? ?Nelva Bush, MD ?01/12/2022 ?10:47 AM ? ?

## 2022-01-12 NOTE — Patient Instructions (Signed)
Medication Instructions:  ° °Your physician recommends that you continue on your current medications as directed. Please refer to the Current Medication list given to you today. ° °*If you need a refill on your cardiac medications before your next appointment, please call your pharmacy* ° ° °Lab Work: ° °None ordered ° °Testing/Procedures: ° °None ordered ° ° °Follow-Up: °At [facility], you and your health needs are our priority.  As part of our continuing mission to provide you with exceptional heart care, we have created designated Provider Care Teams.  These Care Teams include your primary Cardiologist (physician) and Advanced Practice Providers (APPs -  Physician Assistants and Nurse Practitioners) who all work together to provide you with the care you need, when you need it. ° °We recommend signing up for the patient portal called "MyChart".  Sign up information is provided on this After Visit Summary.  MyChart is used to connect with patients for Virtual Visits (Telemedicine).  Patients are able to view lab/test results, encounter notes, upcoming appointments, etc.  Non-urgent messages can be sent to your provider as well.   °To learn more about what you can do with MyChart, go to [email or web address].   ° °Your next appointment:   °1 year(s) ° °The format for your next appointment:   °In Person ° °Provider:   °You may see Dr. Christopher End or one of the following Advanced Practice Providers on your designated Care Team:   °Christopher Berge, NP °Ryan Dunn, PA-C °Cadence Furth, PA-C ° °

## 2022-03-09 DIAGNOSIS — G4700 Insomnia, unspecified: Secondary | ICD-10-CM | POA: Diagnosis not present

## 2022-03-09 DIAGNOSIS — R631 Polydipsia: Secondary | ICD-10-CM | POA: Diagnosis not present

## 2022-03-09 DIAGNOSIS — F840 Autistic disorder: Secondary | ICD-10-CM | POA: Diagnosis not present

## 2022-03-09 DIAGNOSIS — F79 Unspecified intellectual disabilities: Secondary | ICD-10-CM | POA: Diagnosis not present

## 2022-03-09 DIAGNOSIS — Z79899 Other long term (current) drug therapy: Secondary | ICD-10-CM | POA: Diagnosis not present

## 2022-03-09 DIAGNOSIS — F3481 Disruptive mood dysregulation disorder: Secondary | ICD-10-CM | POA: Diagnosis not present

## 2022-07-11 NOTE — Progress Notes (Unsigned)
Name: Erik Henderson   MRN: [id number]    DOB: 09-01-1961   Date:07/12/2022       Progress Note  Subjective  Chief Complaint  Follow Up  HPI  Seizure disorder: caregivers states he had been seizure free for many years but had a syncopal episode March 22 ,, he is taking 250 mg of Depakote in am and 2 in pm. He is getting valproic acid from psychiatrist from [facility]. 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    Anemia: he is not a good candidate for colon cancer screen, family states it will be difficulty to have colonoscopy and were not able to do the hemoccult cards or cologuard. He is now taking iron supplements and we will recheck labs today. Good appetite, weight is stable.    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   Seizure (HCC) 04/16/2019   Behavior concern in adult 09/22/2018   Behavioral change 06/27/2018   Intellectual disability 06/27/2018   Hyperglycemia 08/17/2015   Anxiety 04/14/2015   Hyperlipidemia LDL goal <70 04/14/2015   Cognitive impairment 04/14/2015   Autism spectrum disorder 02/23/2015   Hay fever 02/21/2008    Past Surgical History:  Procedure Laterality Date   NO PAST SURGERIES      Family History  Problem Relation Age of Onset    Hypertension Mother    Hyperlipidemia Mother    Diabetes Mother    Cancer Mother        stomach   Heart disease Mother    Coronary artery disease Father    Gout Father    Heart failure Father    Cancer Father        prostate   Hypertension Brother    Autism Brother    Mental retardation Brother    Stroke Brother     Social History   Tobacco Use   Smoking status: Never   Smokeless tobacco: Never   Tobacco comments:    smoking cessation materials not required  Substance Use Topics   Alcohol use: No    Alcohol/week: 0.0 standard drinks of alcohol     Current Outpatient Medications:    aspirin EC 81 MG tablet, Take 1 tablet (81 mg total) by mouth daily. Swallow whole., Disp: , Rfl:    atorvastatin (LIPITOR) 20 MG tablet, TAKE 1 TABLET BY MOUTH ONCE DAILY., Disp: 30 tablet, Rfl: 11   clonazePAM (KLONOPIN) 0.5 MG tablet, Take 1 tablet by mouth daily as needed., Disp: , Rfl:    Cyanocobalamin (B-12) 1000 MCG SUBL, Place 1 tablet under the tongue 3 (three) times a week., Disp: 12 tablet, Rfl: 5   divalproex (DEPAKOTE) 250 MG DR tablet, Take 250-750 mg by  mouth 3 (three) times daily. 1 breakfast, 1 lunch and 3 at night , Disp: , Rfl:    traZODone (DESYREL) 100 MG tablet, TAKE (1) TABLET BY MOUTH DAILY AT BEDTIME FOR SLEEP., Disp: , Rfl:    escitalopram (LEXAPRO) 20 MG tablet, Take 1 tablet by mouth daily., Disp: , Rfl:   Allergies  Allergen Reactions   Pollen Extract     Other reaction(s): Other (See Comments) Patent allergic to dust, that causes pt to sneeze.    I personally reviewed active problem list, medication list, allergies, family history, social history, health maintenance with the patient/caregiver today.   ROS  Ten systems reviewed and is negative except as mentioned in HPI   Objective  Vitals:   07/12/22 1117  BP: 118/70  Pulse: 76  Resp: 16  SpO2: 96%  Weight: 173 lb (78.5 kg)  Height: 5\' 9"  (1.753 m)    Body mass index is 25.55 kg/m.  Physical  Exam  Constitutional: Patient appears well-developed and well-nourished.  No distress.  HEENT: head atraumatic, normocephalic, pupils equal and reactive to light, neck supple, throat within normal limits Cardiovascular: Normal rate, regular rhythm and normal heart sounds.  No murmur heard. No BLE edema. Pulmonary/Chest: Effort normal and breath sounds normal. No respiratory distress. Abdominal: Soft.  There is no tenderness. Psychiatric: cooperative, rocking in his chair   PHQ2/9:    07/12/2022   11:18 AM 01/11/2022   11:24 AM 11/25/2021    9:34 AM 07/14/2021   11:03 AM 06/14/2021    8:17 AM  Depression screen PHQ 2/9  Decreased Interest 2 0 0 3 0  Down, Depressed, Hopeless 0 0 0 0 0  PHQ - 2 Score 2 0 0 3 0  Altered sleeping 0   3 0  Tired, decreased energy 0   0 0  Change in appetite 0   0 0  Feeling bad or failure about yourself  0   0 0  Trouble concentrating 3   0 0  Moving slowly or fidgety/restless 0   0 0  Suicidal thoughts 0   0 0  PHQ-9 Score 5   6 0  Difficult doing work/chores    Somewhat difficult Not difficult at all    phq 9 is response given by caregiver, sees psychiatrist.    Fall Risk:    07/12/2022   11:18 AM 01/11/2022   11:24 AM 11/25/2021    9:36 AM 07/14/2021   11:02 AM 06/14/2021    8:16 AM  Fall Risk   Falls in the past year? 0 0 0 0 0  Number falls in past yr: 0 0 0  0  Injury with Fall? 0 0 0  0  Risk for fall due to : No Fall Risks No Fall Risks No Fall Risks    Follow up Falls prevention discussed Falls prevention discussed Falls prevention discussed Falls prevention discussed       Functional Status Survey: Is the patient deaf or have difficulty hearing?: No Does the patient have difficulty seeing, even when wearing glasses/contacts?: No Does the patient have difficulty concentrating, remembering, or making decisions?: Yes Does the patient have difficulty walking or climbing stairs?: No Does the patient have difficulty dressing or bathing?:  Yes Does the patient have difficulty doing errands alone such as visiting a doctor's office or shopping?: Yes    Assessment & Plan  1. Seizure (HCC)  - Valproic Acid level  2. Autism   3. B12 deficiency  4. Long-term use of high-risk medication  - COMPLETE METABOLIC PANEL WITH GFR  5. Need for immunization against influenza  - Flu Vaccine QUAD 6+ mos PF IM (Fluarix Quad PF)  6. Dyslipidemia  - Lipid panel - atorvastatin (LIPITOR) 20 MG tablet; Take 1 tablet (20 mg total) by mouth daily.  Dispense: 90 tablet; Refill: 1  7. Anemia, unspecified type  - CBC with Differential/Platelet - Iron, TIBC and Ferritin Panel - Vitamin B12  8. Need for shingles vaccine  - Zoster Vaccine Adjuvanted [facility]) injection; Inject 0.5 mLs into the muscle once for 1 dose.  Dispense: 0.5 mL; Refill: 0

## 2022-07-12 ENCOUNTER — Ambulatory Visit (INDEPENDENT_AMBULATORY_CARE_PROVIDER_SITE_OTHER): Payer: Medicare Other | Admitting: Family Medicine

## 2022-07-12 ENCOUNTER — Encounter: Payer: Self-pay | Admitting: Family Medicine

## 2022-07-12 VITALS — BP 118/70 | HR 76 | Resp 16 | Ht 69.0 in | Wt 173.0 lb

## 2022-07-12 DIAGNOSIS — E785 Hyperlipidemia, unspecified: Secondary | ICD-10-CM | POA: Diagnosis not present

## 2022-07-12 DIAGNOSIS — Z23 Encounter for immunization: Secondary | ICD-10-CM

## 2022-07-12 DIAGNOSIS — E538 Deficiency of other specified B group vitamins: Secondary | ICD-10-CM

## 2022-07-12 DIAGNOSIS — D649 Anemia, unspecified: Secondary | ICD-10-CM | POA: Diagnosis not present

## 2022-07-12 DIAGNOSIS — F840 Autistic disorder: Secondary | ICD-10-CM

## 2022-07-12 DIAGNOSIS — R569 Unspecified convulsions: Secondary | ICD-10-CM

## 2022-07-12 DIAGNOSIS — Z79899 Other long term (current) drug therapy: Secondary | ICD-10-CM

## 2022-07-12 MED ORDER — ATORVASTATIN CALCIUM 20 MG PO TABS: 20.0000 mg | ORAL_TABLET | Freq: Every day | ORAL | 1 refills | Status: DC

## 2022-07-12 MED ORDER — SHINGRIX 50 MCG/0.5ML IM SUSR: 0.5000 mL | Freq: Once | INTRAMUSCULAR | 0 refills | Status: AC

## 2022-07-13 LAB — CBC WITH DIFFERENTIAL/PLATELET
Absolute Monocytes: 501 cells/uL (ref 200–950)
Basophils Absolute: 51 cells/uL (ref 0–200)
Basophils Relative: 1.1 %
Eosinophils Absolute: 101 cells/uL (ref 15–500)
Eosinophils Relative: 2.2 %
HCT: 41.2 % (ref 38.5–50.0)
Hemoglobin: 13.8 g/dL (ref 13.2–17.1)
Lymphs Abs: 1941 cells/uL (ref 850–3900)
MCH: 30.2 pg (ref 27.0–33.0)
MCHC: 33.5 g/dL (ref 32.0–36.0)
MCV: 90.2 fL (ref 80.0–100.0)
MPV: 10 fL (ref 7.5–12.5)
Monocytes Relative: 10.9 %
Neutro Abs: 2006 cells/uL (ref 1500–7800)
Neutrophils Relative %: 43.6 %
Platelets: 210 10*3/uL (ref 140–400)
RBC: 4.57 10*6/uL (ref 4.20–5.80)
RDW: 11.9 % (ref 11.0–15.0)
Total Lymphocyte: 42.2 %
WBC: 4.6 10*3/uL (ref 3.8–10.8)

## 2022-07-13 LAB — COMPLETE METABOLIC PANEL WITH GFR
AG Ratio: 1.1 (calc) (ref 1.0–2.5)
ALT: 9 U/L (ref 9–46)
AST: 18 U/L (ref 10–35)
Albumin: 4 g/dL (ref 3.6–5.1)
Alkaline phosphatase (APISO): 50 U/L (ref 35–144)
BUN: 11 mg/dL (ref 7–25)
CO2: 31 mmol/L (ref 20–32)
Calcium: 9.5 mg/dL (ref 8.6–10.3)
Chloride: 102 mmol/L (ref 98–110)
Creat: 1.17 mg/dL (ref 0.70–1.35)
Globulin: 3.7 g/dL (calc) (ref 1.9–3.7)
Glucose, Bld: 70 mg/dL (ref 65–99)
Potassium: 4.5 mmol/L (ref 3.5–5.3)
Sodium: 141 mmol/L (ref 135–146)
Total Bilirubin: 0.4 mg/dL (ref 0.2–1.2)
Total Protein: 7.7 g/dL (ref 6.1–8.1)
eGFR: 71 mL/min/{1.73_m2} (ref >=60)

## 2022-07-13 LAB — IRON,TIBC AND FERRITIN PANEL
%SAT: 32 % (calc) (ref 20–48)
Ferritin: 107 ng/mL (ref 24–380)
Iron: 94 ug/dL (ref 50–180)
TIBC: 297 mcg/dL (calc) (ref 250–425)

## 2022-07-13 LAB — VITAMIN B12: Vitamin B-12: 953 pg/mL (ref 200–1100)

## 2022-07-13 LAB — LIPID PANEL
Cholesterol: 127 mg/dL (ref <200)
HDL: 48 mg/dL (ref >=40)
LDL Cholesterol (Calc): 62 mg/dL (calc)
Non-HDL Cholesterol (Calc): 79 mg/dL (calc) (ref <130)
Total CHOL/HDL Ratio: 2.6 (calc) (ref <5.0)
Triglycerides: 85 mg/dL (ref <150)

## 2022-07-13 LAB — VALPROIC ACID LEVEL: Valproic Acid Lvl: 83.2 mg/L (ref 50.0–100.0)

## 2022-08-03 DIAGNOSIS — Z79899 Other long term (current) drug therapy: Secondary | ICD-10-CM | POA: Diagnosis not present

## 2022-08-03 DIAGNOSIS — F79 Unspecified intellectual disabilities: Secondary | ICD-10-CM | POA: Diagnosis not present

## 2022-08-03 DIAGNOSIS — F99 Mental disorder, not otherwise specified: Secondary | ICD-10-CM | POA: Diagnosis not present

## 2022-08-03 DIAGNOSIS — F840 Autistic disorder: Secondary | ICD-10-CM | POA: Diagnosis not present

## 2022-08-03 DIAGNOSIS — F5105 Insomnia due to other mental disorder: Secondary | ICD-10-CM | POA: Diagnosis not present

## 2022-08-18 ENCOUNTER — Encounter: Payer: Self-pay | Admitting: Nurse Practitioner

## 2022-08-18 ENCOUNTER — Ambulatory Visit (INDEPENDENT_AMBULATORY_CARE_PROVIDER_SITE_OTHER): Payer: Medicare Other | Admitting: Nurse Practitioner

## 2022-08-18 ENCOUNTER — Other Ambulatory Visit: Payer: Self-pay

## 2022-08-18 VITALS — BP 124/76 | HR 96 | Temp 98.5°F | Resp 18 | Ht 69.0 in | Wt 180.7 lb

## 2022-08-18 DIAGNOSIS — H44532 Leucocoria, left eye: Secondary | ICD-10-CM | POA: Diagnosis not present

## 2022-08-18 DIAGNOSIS — H02402 Unspecified ptosis of left eyelid: Secondary | ICD-10-CM | POA: Diagnosis not present

## 2022-08-18 DIAGNOSIS — H53142 Visual discomfort, left eye: Secondary | ICD-10-CM | POA: Diagnosis not present

## 2022-08-18 NOTE — Progress Notes (Signed)
BP 124/76   Pulse 96   Temp 98.5 F (36.9 C) (Oral)   Resp 18   Ht '5\' 9"'  (1.753 m)   Wt 180 lb 11.2 oz (82 kg)   SpO2 97%   BMI 26.68 kg/m    Subjective:    Patient ID: Erik Henderson, male    DOB: [birth year], 61 y.o.   MRN: [id number]  HPI: Erik Henderson is a 62 y.o. male  Chief Complaint  Patient presents with   Eye Problem    Left eye swollen   Left eye ptosis/photophobia/pupillary whiteness:  Patient here with family who states that they noticed his eye last Tuesday.  Patient is non-verbal.  Patient unable to tell us if he is having pain or if he can see. However during exam he did appear to have photophobia in the left eye.  His left pupil is white.  There is a notable ptosis to the left eye lid.  Recommend urgent referral to optometry. [place] and they can see patient tomorrow morning.  Information provided to patient's family.    Relevant past medical, surgical, family and social history reviewed and updated as indicated. Interim medical history since our last visit reviewed. Allergies and medications reviewed and updated.  Review of Systems  Constitutional: Negative for fever or weight change.  Respiratory: Negative for cough and shortness of breath.   Cardiovascular: Negative for chest pain or palpitations.  Gastrointestinal: Negative for abdominal pain, no bowel changes.  Musculoskeletal: Negative for gait problem or joint swelling.  Skin: Negative for rash.  Neurological: Negative for dizziness or headache.  No other specific complaints in a complete review of systems (except as listed in HPI above).      Objective:    BP 124/76   Pulse 96   Temp 98.5 F (36.9 C) (Oral)   Resp 18   Ht '5\' 9"'  (1.753 m)   Wt 180 lb 11.2 oz (82 kg)   SpO2 97%   BMI 26.68 kg/m   Wt Readings from Last 3 Encounters:  08/18/22 180 lb 11.2 oz (82 kg)  07/12/22 173 lb (78.5 kg)  01/12/22 172 lb (78 kg)    Physical Exam  Constitutional: Patient appears  well-developed and well-nourished.  No distress.  HEENT: head atraumatic, normocephalic, right pupil reactive to light, left pupillary whiteness,  ptosis to left eye, neck supple Cardiovascular: Normal rate, regular rhythm and normal heart sounds.  No murmur heard. No BLE edema. Pulmonary/Chest: Effort normal and breath sounds normal. No respiratory distress. Abdominal: Soft.  There is no tenderness. Psychiatric: Patient has a normal mood and affect. behavior is normal. Judgment and thought content normal.  Results for orders placed or performed in visit on 07/12/22  Lipid panel  Result Value Ref Range   Cholesterol 127 <200 mg/dL   HDL 48 > OR = 40 mg/dL   Triglycerides 85 <150 mg/dL   LDL Cholesterol (Calc) 62 mg/dL (calc)   Total CHOL/HDL Ratio 2.6 <5.0 (calc)   Non-HDL Cholesterol (Calc) 79 <130 mg/dL (calc)  CBC with Differential/Platelet  Result Value Ref Range   WBC 4.6 3.8 - 10.8 Thousand/uL   RBC 4.57 4.20 - 5.80 Million/uL   Hemoglobin 13.8 13.2 - 17.1 g/dL   HCT 41.2 38.5 - 50.0 %   MCV 90.2 80.0 - 100.0 fL   MCH 30.2 27.0 - 33.0 pg   MCHC 33.5 32.0 - 36.0 g/dL   RDW 11.9 11.0 - 15.0 %   Platelets 210 140 -  400 Thousand/uL   MPV 10.0 7.5 - 12.5 fL   Neutro Abs 2,006 1,500 - 7,800 cells/uL   Lymphs Abs 1,941 850 - 3,900 cells/uL   Absolute Monocytes 501 200 - 950 cells/uL   Eosinophils Absolute 101 15 - 500 cells/uL   Basophils Absolute 51 0 - 200 cells/uL   Neutrophils Relative % 43.6 %   Total Lymphocyte 42.2 %   Monocytes Relative 10.9 %   Eosinophils Relative 2.2 %   Basophils Relative 1.1 %  COMPLETE METABOLIC PANEL WITH GFR  Result Value Ref Range   Glucose, Bld 70 65 - 99 mg/dL   BUN 11 7 - 25 mg/dL   Creat 1.17 0.70 - 1.35 mg/dL   eGFR 71 > OR = 60 mL/min/1.81m   BUN/Creatinine Ratio SEE NOTE: 6 - 22 (calc)   Sodium 141 135 - 146 mmol/L   Potassium 4.5 3.5 - 5.3 mmol/L   Chloride 102 98 - 110 mmol/L   CO2 31 20 - 32 mmol/L   Calcium 9.5 8.6 - 10.3  mg/dL   Total Protein 7.7 6.1 - 8.1 g/dL   Albumin 4.0 3.6 - 5.1 g/dL   Globulin 3.7 1.9 - 3.7 g/dL (calc)   AG Ratio 1.1 1.0 - 2.5 (calc)   Total Bilirubin 0.4 0.2 - 1.2 mg/dL   Alkaline phosphatase (APISO) 50 35 - 144 U/L   AST 18 10 - 35 U/L   ALT 9 9 - 46 U/L  Iron, TIBC and Ferritin Panel  Result Value Ref Range   Iron 94 50 - 180 mcg/dL   TIBC 297 250 - 425 mcg/dL (calc)   %SAT 32 20 - 48 % (calc)   Ferritin 107 24 - 380 ng/mL  Vitamin B12  Result Value Ref Range   Vitamin B-12 953 200 - 1,100 pg/mL  Valproic acid level  Result Value Ref Range   Valproic Acid Lvl 83.2 50.0 - 100.0 mg/L      Assessment & Plan:   Problem List Items Addressed This Visit   None Visit Diagnoses     Ptosis of left eyelid    -  Primary   patient going to see [place] [facility] tomorrow at 7:50 am   Photophobia of left eye       patient going to see [place] [facility] tomorrow at 7:50 am   Leukocoria of left eye       patient going to see [place] [facility] tomorrow at 7:50 am        Follow up plan: Return for keep appointment with [place] [facility] tomorrow at 7:50 am.

## 2022-08-19 DIAGNOSIS — H2589 Other age-related cataract: Secondary | ICD-10-CM | POA: Diagnosis not present

## 2022-08-23 DIAGNOSIS — H2512 Age-related nuclear cataract, left eye: Secondary | ICD-10-CM | POA: Diagnosis not present

## 2022-08-25 ENCOUNTER — Encounter: Payer: Self-pay | Admitting: Ophthalmology

## 2022-08-29 NOTE — Discharge Instructions (Signed)

## 2022-08-30 ENCOUNTER — Other Ambulatory Visit: Payer: Self-pay | Admitting: Family Medicine

## 2022-08-30 DIAGNOSIS — E538 Deficiency of other specified B group vitamins: Secondary | ICD-10-CM

## 2022-08-31 ENCOUNTER — Other Ambulatory Visit: Payer: Self-pay

## 2022-08-31 ENCOUNTER — Encounter
Admission: RE | Disposition: A | Discharge status: Home / Self Care | Payer: Self-pay | Source: Home / Self Care | Attending: Ophthalmology

## 2022-08-31 ENCOUNTER — Ambulatory Visit: Payer: Medicare Other | Admitting: Anesthesiology

## 2022-08-31 ENCOUNTER — Encounter: Payer: Self-pay | Admitting: Ophthalmology

## 2022-08-31 ENCOUNTER — Ambulatory Visit
Admission: RE | Admit: 2022-08-31 | Discharge: 2022-08-31 | Disposition: A | Discharge status: Home / Self Care | Payer: Medicare Other | Attending: Ophthalmology | Admitting: Ophthalmology

## 2022-08-31 DIAGNOSIS — F419 Anxiety disorder, unspecified: Secondary | ICD-10-CM | POA: Insufficient documentation

## 2022-08-31 DIAGNOSIS — H2589 Other age-related cataract: Secondary | ICD-10-CM | POA: Diagnosis not present

## 2022-08-31 DIAGNOSIS — G40909 Epilepsy, unspecified, not intractable, without status epilepticus: Secondary | ICD-10-CM | POA: Diagnosis not present

## 2022-08-31 DIAGNOSIS — H2512 Age-related nuclear cataract, left eye: Secondary | ICD-10-CM | POA: Diagnosis not present

## 2022-08-31 DIAGNOSIS — K219 Gastro-esophageal reflux disease without esophagitis: Secondary | ICD-10-CM | POA: Diagnosis not present

## 2022-08-31 DIAGNOSIS — R569 Unspecified convulsions: Secondary | ICD-10-CM | POA: Insufficient documentation

## 2022-08-31 HISTORY — PX: CATARACT EXTRACTION W/PHACO: SHX586

## 2022-08-31 SURGERY — PHACOEMULSIFICATION, CATARACT, WITH IOL INSERTION
Anesthesia: General | Site: Eye | Laterality: Left

## 2022-08-31 MED ORDER — FENTANYL CITRATE (PF) 100 MCG/2ML IJ SOLN
INTRAMUSCULAR | Status: DC | PRN
  Administered 2022-08-31: 25 ug via INTRAVENOUS

## 2022-08-31 MED ORDER — ACETYLCHOLINE CHLORIDE 20 MG IO SOLR
INTRAOCULAR | Status: DC | PRN
  Administered 2022-08-31: 2 mg via INTRAOCULAR

## 2022-08-31 MED ORDER — MIDAZOLAM HCL 2 MG/2ML IJ SOLN
INTRAMUSCULAR | Status: DC | PRN
  Administered 2022-08-31: 2 mg via INTRAVENOUS

## 2022-08-31 MED ORDER — ONDANSETRON HCL 4 MG/2ML IJ SOLN
INTRAMUSCULAR | Status: DC | PRN
  Administered 2022-08-31: 4 mg via INTRAVENOUS

## 2022-08-31 MED ORDER — BRIMONIDINE TARTRATE-TIMOLOL 0.2-0.5 % OP SOLN
OPHTHALMIC | Status: DC | PRN
  Administered 2022-08-31: 1 [drp] via OPHTHALMIC

## 2022-08-31 MED ORDER — SIGHTPATH DOSE#1 NA HYALUR & NA CHOND-NA HYALUR IO KIT
PACK | INTRAOCULAR | Status: DC | PRN
  Administered 2022-08-31: 1 via OPHTHALMIC

## 2022-08-31 MED ORDER — TETRACAINE HCL 0.5 % OP SOLN
1.0000 [drp] | OPHTHALMIC | Status: DC | PRN
  Administered 2022-08-31 (×4): 1 [drp] via OPHTHALMIC

## 2022-08-31 MED ORDER — SODIUM HYALURONATE 23MG/ML IO SOSY
PREFILLED_SYRINGE | INTRAOCULAR | Status: DC | PRN
  Administered 2022-08-31: 0.6 mL via INTRAOCULAR

## 2022-08-31 MED ORDER — NEOMYCIN-POLYMYXIN-DEXAMETH 3.5-10000-0.1 OP OINT
TOPICAL_OINTMENT | OPHTHALMIC | Status: DC | PRN
  Administered 2022-08-31: 1 via OPHTHALMIC

## 2022-08-31 MED ORDER — LACTATED RINGERS IV SOLN: INTRAVENOUS | Status: DC

## 2022-08-31 MED ORDER — SIGHTPATH DOSE#1 BSS IO SOLN
INTRAOCULAR | Status: DC | PRN
  Administered 2022-08-31: 58 mL via OPHTHALMIC

## 2022-08-31 MED ORDER — EPHEDRINE SULFATE (PRESSORS) 50 MG/ML IJ SOLN
INTRAMUSCULAR | Status: DC | PRN
  Administered 2022-08-31: 5 mg via INTRAVENOUS
  Administered 2022-08-31: 10 mg via INTRAVENOUS
  Administered 2022-08-31: 5 mg via INTRAVENOUS

## 2022-08-31 MED ORDER — ARMC OPHTHALMIC DILATING DROPS
1.0000 | OPHTHALMIC | Status: DC | PRN
  Administered 2022-08-31 (×3): 1 via OPHTHALMIC

## 2022-08-31 MED ORDER — SIGHTPATH DOSE#1 BSS IO SOLN
INTRAOCULAR | Status: DC | PRN
  Administered 2022-08-31 (×3): 15 mL

## 2022-08-31 MED ORDER — LIDOCAINE HCL (CARDIAC) PF 100 MG/5ML IV SOSY
PREFILLED_SYRINGE | INTRAVENOUS | Status: DC | PRN
  Administered 2022-08-31: 60 mg via INTRATRACHEAL

## 2022-08-31 MED ORDER — SIGHTPATH DOSE#1 BSS IO SOLN
INTRAOCULAR | Status: DC | PRN
  Administered 2022-08-31: 1 mL via INTRAMUSCULAR

## 2022-08-31 MED ORDER — PROPOFOL 10 MG/ML IV BOLUS
INTRAVENOUS | Status: DC | PRN
  Administered 2022-08-31: 130 mg via INTRAVENOUS

## 2022-08-31 MED ORDER — CEFUROXIME OPHTHALMIC INJECTION 1 MG/0.1 ML
INJECTION | OPHTHALMIC | Status: DC | PRN
  Administered 2022-08-31: 0.1 mL via INTRACAMERAL

## 2022-08-31 SURGICAL SUPPLY — 23 items
CANNULA ANT/CHMB 27G (MISCELLANEOUS) IMPLANT
CANNULA ANT/CHMB 27GA (MISCELLANEOUS) ×1 IMPLANT
CATARACT SUITE SIGHTPATH (MISCELLANEOUS) ×1 IMPLANT
CORD BIP STRL DISP 12FT (MISCELLANEOUS) IMPLANT
ERASER HMR WETFIELD 18G (MISCELLANEOUS) IMPLANT
FEE CATARACT SUITE SIGHTPATH (MISCELLANEOUS) ×1 IMPLANT
GLIDE IOL SHEET 35MLX5ML (MISCELLANEOUS) IMPLANT
GLOVE SRG 8 PF TXTR STRL LF DI (GLOVE) ×1 IMPLANT
GLOVE SURG ENC TEXT LTX SZ7.5 (GLOVE) ×1 IMPLANT
GLOVE SURG UNDER POLY LF SZ8 (GLOVE) ×1
KNIFE 45D UP 2.3 (MISCELLANEOUS) IMPLANT
LENS IOL +18 DIOP 13X5.5X1 (Intraocular Lens) ×1 IMPLANT
LENS IOL KELMAN MF 18.0 (Intraocular Lens) IMPLANT
LENS IOL KELMAN MULTIFLEX 18.0 (Intraocular Lens) ×1 IMPLANT
LENS IOL TECNIS EYHANCE 21.0 (Intraocular Lens) IMPLANT
NDL FILTER BLUNT 18X1 1/2 (NEEDLE) ×1 IMPLANT
NEEDLE FILTER BLUNT 18X1 1/2 (NEEDLE) ×1 IMPLANT
PACK VIT ANT 23G (MISCELLANEOUS) IMPLANT
SPONGE SURG I SPEAR (MISCELLANEOUS) IMPLANT
SUT ETHILON 10-0 CS-B-6CS-B-6 (SUTURE) ×1
SUTURE EHLN 10-0 CS-B-6CS-B-6 (SUTURE) IMPLANT
SYR 3ML LL SCALE MARK (SYRINGE) ×1 IMPLANT
WATER STERILE IRR 250ML POUR (IV SOLUTION) ×1 IMPLANT

## 2022-08-31 NOTE — Transfer of Care (Signed)
Immediate Anesthesia Transfer of Care Note  Patient: Erik Henderson  Procedure(s) Performed: CATARACT EXTRACTION PHACO AND INTRAOCULAR LENS PLACEMENT (IOC) LEFT VISION BLUE, HEALON 5 3.91 00:19.9 (Left: Eye)  Patient Location: PACU  Anesthesia Type: General LMA  Level of Consciousness: awake, alert  and patient cooperative  Airway and Oxygen Therapy: Patient Spontanous Breathing and Patient connected to supplemental oxygen  Post-op Assessment: Post-op Vital signs reviewed, Patient's Cardiovascular Status Stable, Respiratory Function Stable, Patent Airway and No signs of Nausea or vomiting  Post-op Vital Signs: Reviewed and stable  Complications: No notable events documented.

## 2022-08-31 NOTE — Anesthesia Postprocedure Evaluation (Signed)
Anesthesia Post Note  Patient: Erik Henderson  Procedure(s) Performed: CATARACT EXTRACTION PHACO AND INTRAOCULAR LENS PLACEMENT (IOC) LEFT VISION BLUE, HEALON 5 3.91 00:19.9 (Left: Eye)  Patient location during evaluation: PACU Anesthesia Type: General Level of consciousness: awake and alert Pain management: pain level controlled Vital Signs Assessment: post-procedure vital signs reviewed and stable Respiratory status: spontaneous breathing, nonlabored ventilation, respiratory function stable and patient connected to nasal cannula oxygen Cardiovascular status: stable and blood pressure returned to baseline Postop Assessment: no apparent nausea or vomiting Anesthetic complications: no   No notable events documented.   Last Vitals:  Vitals:   08/31/22 1458 08/31/22 1509  BP: 112/65 124/77  Pulse: 62 62  Resp: 18 19  Temp: (!) 36.3 C (!) 36.4 C  SpO2: 96% 100%    Last Pain:  Vitals:   08/31/22 1509  TempSrc:   PainSc: 0-No pain                 Molli Barrows

## 2022-08-31 NOTE — Anesthesia Preprocedure Evaluation (Signed)
Anesthesia Evaluation  Patient identified by MRN, date of birth, ID band Patient awake    Reviewed: Allergy & Precautions, H&P , NPO status , Patient's Chart, lab work & pertinent test results, reviewed documented beta blocker date and time   Airway Mallampati: II  TM Distance: >3 FB Neck ROM: full    Dental  (+) Teeth Intact   Pulmonary neg pulmonary ROS,    Pulmonary exam normal        Cardiovascular Exercise Tolerance: Good negative cardio ROS Normal cardiovascular exam Rate:Normal     Neuro/Psych Seizures -,  Anxiety Non verbal negative psych ROS   GI/Hepatic Neg liver ROS, GERD  Medicated,  Endo/Other  negative endocrine ROS  Renal/GU negative Renal ROS  negative genitourinary   Musculoskeletal   Abdominal   Peds  Hematology negative hematology ROS (+)   Anesthesia Other Findings   Reproductive/Obstetrics negative OB ROS                             Anesthesia Physical Anesthesia Plan  ASA: 3  Anesthesia Plan: General LMA   Post-op Pain Management:    Induction:   PONV Risk Score and Plan: 2  Airway Management Planned:   Additional Equipment:   Intra-op Plan:   Post-operative Plan:   Informed Consent: I have reviewed the patients History and Physical, chart, labs and discussed the procedure including the risks, benefits and alternatives for the proposed anesthesia with the patient or authorized representative who has indicated his/her understanding and acceptance.       Plan Discussed with: CRNA  Anesthesia Plan Comments:         Anesthesia Quick Evaluation

## 2022-08-31 NOTE — Op Note (Signed)
OPERATIVE NOTE  Markes Shatswell [id number] 08/31/2022   PREOPERATIVE DIAGNOSIS:  H25.89 Cataract            Mature (Total) Cataract Left Eye H25.89   POSTOPERATIVE DIAGNOSIS: same          PROCEDURE:  extracapsular cataract extraction with posterior chamber intraocular lens placement of the left eye .  Vision Blue dye was used to stain the lens capsule.  LENS:   Implant Name Type Inv. Item Serial No. Manufacturer Lot No. LRB No. Used Action  LENS IOL TECNIS EYHANCE 21.0 - [id number] Intraocular Lens LENS IOL TECNIS EYHANCE 21.0 [id number] [employer]  Left 1 Wasted  LENS IOL [facility] MULTIFLEX 18.0 - [id number] Intraocular Lens LENS IOL K[employer]MAN MULTIFLEX 18.0 [id number] [employer]  Left 1 Implanted      first lens not used due to no zonular support. ACIOL implanted  \SURGEON:  Deirdre Evener, MD   ANESTHESIA:  General   COMPLICATIONS:  None.   DESCRIPTION OF PROCEDURE: The patient was identified in the holding room and transported to the operating room and placed in the supine position under the operating microscope.  The left eye was identified as the operative eye and it was prepped and draped in the usual sterile ophthalmic fashion.  A 1 millimeter clear-corneal paracentesis was made at the 1:30 position.  0.5 ml of preservative-free 1% lidocaine was injected into the anterior chamber. Vision Blue dye was then injected to stain the lens capsule.  BSS was then used to wash the dye out.  Additional Healon 5 was placed into the anterior chamber. Lens tilt was immediately noticed with loss of zonules superiorly. A conjunctival peritomy and scleral tunnel incision of 83mm was made.   A 2.4 millimeter keratome was used to enter the eye.  A curvilinear capsulorrhexis was made with a cystotome and capsulorrhexis forceps.  Balanced salt solution was used to hydrodissect and hydrodelineate the nucleus.  Viscoat was then placed in the anterior chamber.   Phacoemulsification was then  attempted in stop and chop fashion to remove the lens nucleus and epinucleus.  This approach was abandoned in favor of removing the entire lens-bag complex.  The scleral tunnel incision was enlarged.  Additional healon5 was placed I to the anterior chamber and beneath the lens to levitate and elevate it. A lens loop was used to remove the lens.  It fragmented during removal.  Temporary sutures were placed. Additional viscoelastic was placed into the anterior chamber and the instruments were removed.   A new paracentesis was added at the 10:00 position. Bimanual anterior vitrectomy was performed to remove the vitreous and lens cortex.  Wounds were checked with Weck cells and noted to be free of incarcerated vitreous strands. Miochol was placed. A peripheral iridotomy was performed.   An [id number] 18.0 diopter lens was inserted in to the ciliary sulcus.   The bimanual vitrector was used to aspirate the viscoelastic. Wounds were again checked to ensure no vitreous was present.  The lens was well centered.    Three 10-0 nylon sutures were placed through the incision and the peritomy was closed with a buried 10-0 nylon suture.  Wounds were hydrated with balanced salt solution.  The anterior chamber was inflated to a physiologic pressure with balanced salt solution. Cefuroxime 0.1 ml of a 10mg /ml solution was injected into the anterior chamber for a dose of 1 mg of intracameral antibiotic at the completion of the case.  No wound leaks were noted.  Topical Timolol and Brimonidine  drops and Maxitrol ointment were applied to the eye. The eye was patched and shielded, and the patient was awakened from general anesthesia.  The patient was taken to the recovery room in stable condition.   Laya Letendre 08/31/2022, 2:52 PM

## 2022-08-31 NOTE — H&P (Signed)
[facility]   Primary Care Physician:  Alba Cory, MD Ophthalmologist: Dr. Lockie Mola  Pre-Procedure History & Physical: HPI:  Erik Henderson is a 61 y.o. male here for ophthalmic surgery.   Past Medical History:  Diagnosis Date   Allergy    Anxiety    Autism    Autistic disorder    Mental disorder    Mental retardation     Past Surgical History:  Procedure Laterality Date   MRI     Sedation for MRI   NO PAST SURGERIES      Prior to Admission medications   Medication Sig Start Date End Date Taking? Authorizing Provider  aspirin EC 81 MG tablet Take 1 tablet (81 mg total) by mouth daily. Swallow whole. 01/22/21  Yes End, Cristal Deer, MD  atorvastatin (LIPITOR) 20 MG tablet Take 1 tablet (20 mg total) by mouth daily. 07/12/22  Yes Sowles, Danna Hefty, MD  clonazePAM (KLONOPIN) 0.5 MG tablet Take 1 tablet by mouth daily as needed. 04/15/20  Yes Hill, [facility]  Cyanocobalamin (B-12) 1000 MCG SUBL Place 1 tablet under the tongue 3 (three) times a week. 01/12/22  Yes Sowles, Danna Hefty, MD  divalproex (DEPAKOTE) 250 MG DR tablet Take 250-750 mg by mouth 3 (three) times daily. 1 breakfast, 1 lunch and 3 at night    Yes [facility], [facility]  escitalopram (LEXAPRO) 20 MG tablet Take 1 tablet by mouth daily. 09/30/20 08/31/22 Yes [facility]  traZODone (DESYREL) 100 MG tablet TAKE (1) TABLET BY MOUTH DAILY AT BEDTIME FOR SLEEP. 10/29/21  Yes [provider]    Allergies as of 08/22/2022 - Review Complete 08/18/2022  Allergen Reaction Noted   Pollen extract  08/15/2018    Family History  Problem Relation Age of Onset   Hypertension Mother    Hyperlipidemia Mother    Diabetes Mother    Cancer Mother        stomach   Heart disease Mother    Coronary artery disease Father    Gout Father    Heart failure Father    Cancer Father        prostate   Hypertension Brother    Autism Brother    Mental  retardation Brother    Stroke Brother     Social History   Socioeconomic History   Marital status: Single    Spouse name: Not on file   Number of children: 0   Years of education: Not on file   Highest education level: Never attended school  Occupational History   Occupation: disabled  Tobacco Use   Smoking status: Never   Smokeless tobacco: Never   Tobacco comments:    smoking cessation materials not required  Vaping Use   Vaping Use: Never used  Substance and Sexual Activity   Alcohol use: No    Alcohol/week: 0.0 standard drinks of alcohol   Drug use: No   Sexual activity: Never  Other Topics Concern   Not on file  Social History Narrative   Lives with mother, brothers and sisters; sisters are caregivers; mother is guardian/POA -- family discussing transfer of guardianship/POA to sister as parents in poor health   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                   Social Determinants of Health   Financial Resource Strain: Low Risk  (11/25/2021)   Overall Physicist, medical Strain (  CARDIA)    Difficulty of Paying Living Expenses: Not very hard  Food Insecurity: No Food Insecurity (11/25/2021)   Hunger Vital Sign    Worried About Running Out of Food in the Last Year: Never true    Ran Out of Food in the Last Year: Never true  Transportation Needs: No Transportation Needs (11/25/2021)   PRAPARE - Hydrologist (Medical): No    Lack of Transportation (Non-Medical): No  Physical Activity: Inactive (11/25/2021)   Exercise Vital Sign    Days of Exercise per Week: 0 days    Minutes of Exercise per Session: 0 min  Stress: No Stress Concern Present (11/25/2021)   Agenda    Feeling of Stress : Only a little  Social Connections: Socially Isolated (11/25/2021)   Social Connection and Isolation Panel [NHANES]    Frequency of Communication with  Friends and Family: Never    Frequency of Social Gatherings with Friends and Family: Never    Attends Religious Services: Never    Marine scientist or Organizations: No    Attends Archivist Meetings: Never    Marital Status: Never married  Intimate Partner Violence: Not At Risk (11/25/2021)   Humiliation, Afraid, Rape, and Kick questionnaire    Fear of Current or Ex-Partner: No    Emotionally Abused: No    Physically Abused: No    Sexually Abused: No    Review of Systems: See HPI, otherwise negative ROS  Physical Exam: BP 117/83   Pulse 66   Temp 97.6 F (36.4 C) (Temporal)   Ht 5\' 7"  (1.702 m)   Wt 81.6 kg   SpO2 98%   BMI 28.19 kg/m  General:   Alert,  pleasant and cooperative in NAD Head:  Normocephalic and atraumatic. Lungs:  Clear to auscultation.    Heart:  Regular rate and rhythm.   Impression/Plan: Erik Henderson is here for ophthalmic surgery.  Risks, benefits, limitations, and alternatives regarding ophthalmic surgery have been reviewed with the patient.  Questions have been answered.  All parties agreeable.   Leandrew Koyanagi, MD  08/31/2022, 12:42 PM

## 2022-09-01 ENCOUNTER — Encounter: Payer: Self-pay | Admitting: Ophthalmology

## 2022-12-07 DIAGNOSIS — Z79899 Other long term (current) drug therapy: Secondary | ICD-10-CM | POA: Diagnosis not present

## 2022-12-07 DIAGNOSIS — F840 Autistic disorder: Secondary | ICD-10-CM | POA: Diagnosis not present

## 2022-12-07 DIAGNOSIS — R631 Polydipsia: Secondary | ICD-10-CM | POA: Diagnosis not present

## 2022-12-07 DIAGNOSIS — F3481 Disruptive mood dysregulation disorder: Secondary | ICD-10-CM | POA: Diagnosis not present

## 2022-12-07 DIAGNOSIS — G4700 Insomnia, unspecified: Secondary | ICD-10-CM | POA: Diagnosis not present

## 2022-12-07 DIAGNOSIS — F79 Unspecified intellectual disabilities: Secondary | ICD-10-CM | POA: Diagnosis not present

## 2022-12-19 ENCOUNTER — Telehealth: Payer: Self-pay | Admitting: Family Medicine

## 2022-12-19 NOTE — Telephone Encounter (Unsigned)
Copied from [place] [id number]. Topic: General - Other >> Dec 19, 2022  3:08 PM Chapman Fitch wrote: Reason for CRM: Pt needs help finding pullup large enough to fit pt / please advise

## 2022-12-20 ENCOUNTER — Telehealth: Payer: Self-pay

## 2022-12-20 NOTE — Telephone Encounter (Signed)
    RN Visit Note   12/20/22 Name: [name] MRN: [id number]      DOB: December 20, 1960  Subjective: Erik Henderson is a 62 y.o. year old male who is a primary care patient of Steele Sizer, MD.    Attempted outreach with Erik Henderson caregiver/ Guadalupe Dawn regarding request for adult pull-ups. Caregiver was not available at the time of the call. A member of the care team will follow up this week.   PLAN: A member of the care management team will follow up this week.   Horris Latino RN Care Manager/Chronic Care Management [phone]

## 2022-12-23 ENCOUNTER — Telehealth: Payer: Self-pay

## 2022-12-23 NOTE — Telephone Encounter (Signed)
  Care Management   Visit Note  12/23/2022 Name: Erik Henderson MRN: [id number] DOB: [birth year]  Subjective: Lamontez Kleist is a 62 y.o. year old male who is a primary care patient of Steele Sizer, MD. The Care Management team was consulted for assistance.      Engaged with patient's mother/caregiver Willie by telephone.  Assessment:   Interventions:  Plan:

## 2022-12-29 ENCOUNTER — Telehealth: Payer: Self-pay | Admitting: Family Medicine

## 2022-12-29 NOTE — Telephone Encounter (Signed)
Contacted Erik Henderson to schedule their annual wellness visit. Appointment made for 01/26/2023.  [place] Direct Dial: [phone]

## 2023-01-09 ENCOUNTER — Other Ambulatory Visit: Payer: Self-pay | Admitting: Family Medicine

## 2023-01-09 DIAGNOSIS — E785 Hyperlipidemia, unspecified: Secondary | ICD-10-CM

## 2023-01-10 NOTE — Progress Notes (Deleted)
Name: Erik Henderson   MRN: [id number]    DOB: 01/19/1961   Date:01/10/2023       Progress Note  Subjective  Chief Complaint  Follow Up  HPI  Seizure disorder: caregivers states he had been seizure free for many years but had a syncopal episode March 22 ,, he is taking 250 mg of Depakote in am and 2 in pm. He is getting valproic acid from psychiatrist from [facility]. He tolerates medication well and no side effects, he is due for labs today , insurance denied coverage for vitamin D    Insulin Resistance:  he is on diabetic diet, weight is stable, he tries to eat a diabetic diet  Dyslipidemia: on life style modification, last LDL was normal at 70 , we will recheck labs    Autism spectrum disorder:He sees psychiatrist at [facility] and is taking Depakote plus clonazepam 0.5 TID He has been tolerating medication well He does not speak, he rocks back and forth in his chair.    Anemia: he is not a good candidate for colon cancer screen, family states it will be difficulty to have colonoscopy and were not able to do the hemoccult cards or cologuard. He is now taking iron supplements and we will recheck labs today. Good appetite, weight is stable.    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   Seizure ([place]) 04/16/2019   Behavior concern in adult 09/22/2018   Behavioral change 06/27/2018   Intellectual disability 06/27/2018   Hyperglycemia 08/17/2015   Anxiety 04/14/2015   Hyperlipidemia LDL goal <70 04/14/2015   Cognitive impairment 04/14/2015   Autism spectrum disorder 02/23/2015   Hay fever 02/21/2008    Past Surgical History:  Procedure Laterality Date   CATARACT EXTRACTION W/PHACO Left 08/31/2022   Procedure: CATARACT EXTRACTION  PHACO AND INTRAOCULAR LENS PLACEMENT (IOC) LEFT VISION BLUE, HEALON 5 3.91 00:19.9;  Surgeon: Leandrew Koyanagi, MD;  Location: [place];  Service: Ophthalmology;  Laterality: Left;   MRI     Sedation for MRI   NO PAST SURGERIES      Family History  Problem Relation Age of Onset   Hypertension Mother    Hyperlipidemia Mother    Diabetes Mother    Cancer Mother        stomach   Heart disease Mother    Coronary artery disease Father    Gout Father    Heart failure Father    Cancer Father        prostate   Hypertension Brother    Autism Brother    Mental retardation Brother    Stroke Brother     Social History   Tobacco Use   Smoking status: Never   Smokeless tobacco: Never   Tobacco comments:    smoking cessation materials not required  Substance Use Topics   Alcohol use: No    Alcohol/week: 0.0 standard drinks of alcohol     Current Outpatient Medications:    aspirin EC 81 MG tablet, Take 1 tablet (81 mg total) by mouth daily. Swallow whole., Disp: , Rfl:    atorvastatin (LIPITOR) 20 MG tablet, Take 1 tablet (20 mg total) by mouth daily., Disp: 90 tablet, Rfl: 1  clonazePAM (KLONOPIN) 0.5 MG tablet, Take 1 tablet by mouth daily as needed., Disp: , Rfl:    Cyanocobalamin (VITAMIN B-12) 1000 MCG SUBL, DISSOLVE 1 TABLET UNDER THE TONGUE THREE TIMES A WEEK., Disp: 12 tablet, Rfl: 5   divalproex (DEPAKOTE) 250 MG DR tablet, Take 250-750 mg by mouth 3 (three) times daily. 1 breakfast, 1 lunch and 3 at night , Disp: , Rfl:    escitalopram (LEXAPRO) 20 MG tablet, Take 1 tablet by mouth daily., Disp: , Rfl:    traZODone (DESYREL) 100 MG tablet, TAKE (1) TABLET BY MOUTH DAILY AT BEDTIME FOR SLEEP., Disp: , Rfl:   Allergies  Allergen Reactions   Pollen Extract     Other reaction(s): Other (See Comments) Patent allergic to dust, that causes pt to sneeze.    I personally reviewed active problem list, medication list, allergies, family history, social history,  health maintenance with the patient/caregiver today.   ROS  ***  Objective  There were no vitals filed for this visit.  There is no height or weight on file to calculate BMI.  Physical Exam ***  No results found for this or any previous visit (from the past 2160 hour(s)).   PHQ2/9:    08/18/2022   11:15 AM 07/12/2022   11:18 AM 01/11/2022   11:24 AM 11/25/2021    9:34 AM 07/14/2021   11:03 AM  Depression screen PHQ 2/9  Decreased Interest 0 2 0 0 3  Down, Depressed, Hopeless 0 0 0 0 0  PHQ - 2 Score 0 2 0 0 3  Altered sleeping  0   3  Tired, decreased energy  0   0  Change in appetite  0   0  Feeling bad or failure about yourself   0   0  Trouble concentrating  3   0  Moving slowly or fidgety/restless  0   0  Suicidal thoughts  0   0  PHQ-9 Score  5   6  Difficult doing work/chores     Somewhat difficult    phq 9 is {gen pos [id number]   Fall Risk:    08/18/2022   11:14 AM 07/12/2022   11:18 AM 01/11/2022   11:24 AM 11/25/2021    9:36 AM 07/14/2021   11:02 AM  Fall Risk   Falls in the past year? 0 0 0 0 0  Number falls in past yr: 0 0 0 0   Injury with Fall? 0 0 0 0   Risk for fall due to :  No Fall Risks No Fall Risks No Fall Risks   Follow up Falls evaluation completed Falls prevention discussed Falls prevention discussed Falls prevention discussed Falls prevention discussed      Functional Status Survey:      Assessment & Plan  *** There are no diagnoses linked to this encounter.

## 2023-01-11 ENCOUNTER — Ambulatory Visit: Payer: Medicare Other | Admitting: Family Medicine

## 2023-01-21 ENCOUNTER — Other Ambulatory Visit: Payer: Self-pay | Admitting: Family Medicine

## 2023-01-21 DIAGNOSIS — E785 Hyperlipidemia, unspecified: Secondary | ICD-10-CM

## 2023-01-23 NOTE — Telephone Encounter (Signed)
LVM to call and schedule appt last one was missed.

## 2023-01-26 ENCOUNTER — Ambulatory Visit (INDEPENDENT_AMBULATORY_CARE_PROVIDER_SITE_OTHER): Payer: Medicare Other

## 2023-01-26 VITALS — Ht 69.0 in | Wt 190.0 lb

## 2023-01-26 DIAGNOSIS — Z0000 Encounter for general adult medical examination without abnormal findings: Secondary | ICD-10-CM

## 2023-01-26 NOTE — Patient Instructions (Signed)
Erik Henderson , Thank you for taking time to come for your Medicare Wellness Visit. I appreciate your ongoing commitment to your health goals. Please review the following plan we discussed and let me know if I can assist you in the future.   These are the goals we discussed:  Goals       "We need all the help we can get with him. He has behavior problems right now." (pt-stated)      Family caregiver reports worsening obsession with water consumption, decreased food intake and related weight loss and anemia. Caregiver reports that she has spoken with psychiatrist who advises that patient's Lorazepam "might be" increased when he is seen in the office on 10/16. She is to call the neurology office tomorrow to reschedule a missed new patient appointment.   Clinical Goals: Over the next 7 days, patient's sister/caregiver will report receipt of neuropsychology appointment.  Interventions: Provided direction re: reaching out to neurology provider to reschedule appointment; offered support if difficulty encountered with scheduling appointment with specialty provider office; updated primary care provider       "We need to sort out guardianship and power of attorney" (pt-stated)      Patient's family/caregiver indicates that mother holds guardianship/POA currently but wishes to transfer guardianship/POA to daughter who is primary caregiver.   Clinical Goals: Over the next 30 days, patient's family/caregivers will complete required documentation for changes in guardianship/POA.   Interventions: Provided sister with information r/t changes in guardianship/POA. Offered to assist with obtaining needed documentation.        DIET - INCREASE WATER INTAKE      Recommend to drink at least 6-8 8oz glasses of water per day.        This is a list of the screening recommended for you and due dates:  Health Maintenance  Topic Date Due   Colon Cancer Screening  Never done   COVID-19 Vaccine (4 - 2023-24 season)  07/01/2022   Zoster (Shingles) Vaccine (1 of 2) 04/12/2023*   HIV Screening  08/10/2029*   Medicare Annual Wellness Visit  01/26/2024   DTaP/Tdap/Td vaccine (3 - Td or Tdap) 07/15/2031   Flu Shot  Completed   Hepatitis C Screening: USPSTF Recommendation to screen - Ages 18-79 yo.  Completed   HPV Vaccine  Aged Out  *Topic was postponed. The date shown is not the original due date.    Advanced directives: no  Conditions/risks identified: none  Next appointment: Follow up in one year for your annual wellness visit 02/01/2024 @2pm  telephone  Preventive Care 40-64 Years, Male Preventive care refers to lifestyle choices and visits with your health care provider that can promote health and wellness. What does preventive care include? A yearly physical exam. This is also called an annual well check. Dental exams once or twice a year. Routine eye exams. Ask your health care provider how often you should have your eyes checked. Personal lifestyle choices, including: Daily care of your teeth and gums. Regular physical activity. Eating a healthy diet. Avoiding tobacco and drug use. Limiting alcohol use. Practicing safe sex. Taking low-dose aspirin every day starting at age 10. What happens during an annual well check? The services and screenings done by your health care provider during your annual well check will depend on your age, overall health, lifestyle risk factors, and family history of disease. Counseling  Your health care provider may ask you questions about your: Alcohol use. Tobacco use. Drug use. Emotional well-being. Home and relationship well-being.  Sexual activity. Eating habits. Work and work Statistician. Screening  You may have the following tests or measurements: Height, weight, and BMI. Blood pressure. Lipid and cholesterol levels. These may be checked every 5 years, or more frequently if you are over 52 years old. Skin check. Lung cancer screening. You may  have this screening every year starting at age 85 if you have a 30-pack-year history of smoking and currently smoke or have quit within the past 15 years. Fecal occult blood test (FOBT) of the stool. You may have this test every year starting at age 53. Flexible sigmoidoscopy or colonoscopy. You may have a sigmoidoscopy every 5 years or a colonoscopy every 10 years starting at age 47. Prostate cancer screening. Recommendations will vary depending on your family history and other risks. Hepatitis C blood test. Hepatitis B blood test. Sexually transmitted disease (STD) testing. Diabetes screening. This is done by checking your blood sugar (glucose) after you have not eaten for a while (fasting). You may have this done every 1-3 years. Discuss your test results, treatment options, and if necessary, the need for more tests with your health care provider. Vaccines  Your health care provider may recommend certain vaccines, such as: Influenza vaccine. This is recommended every year. Tetanus, diphtheria, and acellular pertussis (Tdap, Td) vaccine. You may need a Td booster every 10 years. Zoster vaccine. You may need this after age 44. Pneumococcal 13-valent conjugate (PCV13) vaccine. You may need this if you have certain conditions and have not been vaccinated. Pneumococcal polysaccharide (PPSV23) vaccine. You may need one or two doses if you smoke cigarettes or if you have certain conditions. Talk to your health care provider about which screenings and vaccines you need and how often you need them. This information is not intended to replace advice given to you by your health care provider. Make sure you discuss any questions you have with your health care provider. Document Released: 11/13/2015 Document Revised: 07/06/2016 Document Reviewed: 08/18/2015 Elsevier Interactive Patient Education  2017 [place] Prevention in the Home Falls can cause injuries. They can happen to people of all  ages. There are many things you can do to make your home safe and to help prevent falls. What can I do on the outside of my home? Regularly fix the edges of walkways and driveways and fix any cracks. Remove anything that might make you trip as you walk through a door, such as a raised step or threshold. Trim any bushes or trees on the path to your home. Use bright outdoor lighting. Clear any walking paths of anything that might make someone trip, such as rocks or tools. Regularly check to see if handrails are loose or broken. Make sure that both sides of any steps have handrails. Any raised decks and porches should have guardrails on the edges. Have any leaves, snow, or ice cleared regularly. Use sand or salt on walking paths during winter. Clean up any spills in your garage right away. This includes oil or grease spills. What can I do in the bathroom? Use night lights. Install grab bars by the toilet and in the tub and shower. Do not use towel bars as grab bars. Use non-skid mats or decals in the tub or shower. If you need to sit down in the shower, use a plastic, non-slip stool. Keep the floor dry. Clean up any water that spills on the floor as soon as it happens. Remove soap buildup in the tub or shower regularly. Attach  bath mats securely with double-sided non-slip rug tape. Do not have throw rugs and other things on the floor that can make you trip. What can I do in the bedroom? Use night lights. Make sure that you have a light by your bed that is easy to reach. Do not use any sheets or blankets that are too big for your bed. They should not hang down onto the floor. Have a firm chair that has side arms. You can use this for support while you get dressed. Do not have throw rugs and other things on the floor that can make you trip. What can I do in the kitchen? Clean up any spills right away. Avoid walking on wet floors. Keep items that you use a lot in easy-to-reach places. If you  need to reach something above you, use a strong step stool that has a grab bar. Keep electrical cords out of the way. Do not use floor polish or wax that makes floors slippery. If you must use wax, use non-skid floor wax. Do not have throw rugs and other things on the floor that can make you trip. What can I do with my stairs? Do not leave any items on the stairs. Make sure that there are handrails on both sides of the stairs and use them. Fix handrails that are broken or loose. Make sure that handrails are as long as the stairways. Check any carpeting to make sure that it is firmly attached to the stairs. Fix any carpet that is loose or worn. Avoid having throw rugs at the top or bottom of the stairs. If you do have throw rugs, attach them to the floor with carpet tape. Make sure that you have a light switch at the top of the stairs and the bottom of the stairs. If you do not have them, ask someone to add them for you. What else can I do to help prevent falls? Wear shoes that: Do not have high heels. Have rubber bottoms. Are comfortable and fit you well. Are closed at the toe. Do not wear sandals. If you use a stepladder: Make sure that it is fully opened. Do not climb a closed stepladder. Make sure that both sides of the stepladder are locked into place. Ask someone to hold it for you, if possible. Clearly mark and make sure that you can see: Any grab bars or handrails. First and last steps. Where the edge of each step is. Use tools that help you move around (mobility aids) if they are needed. These include: Canes. Walkers. Scooters. Crutches. Turn on the lights when you go into a dark area. Replace any light bulbs as soon as they burn out. Set up your furniture so you have a clear path. Avoid moving your furniture around. If any of your floors are uneven, fix them. If there are any pets around you, be aware of where they are. Review your medicines with your doctor. Some medicines  can make you feel dizzy. This can increase your chance of falling. Ask your doctor what other things that you can do to help prevent falls. This information is not intended to replace advice given to you by your health care provider. Make sure you discuss any questions you have with your health care provider. Document Released: 08/13/2009 Document Revised: 03/24/2016 Document Reviewed: 11/21/2014 Elsevier Interactive Patient Education  2017 [employer].

## 2023-01-26 NOTE — Progress Notes (Signed)
I connected with Erik Henderson, mother and caretaker for Erik Henderson on 01/26/23 by a audio/telephone and verified that I am speaking with the correct person using two identifiers.  Patient Location: Home  Provider Location: Office/Clinic  I discussed the limitations of evaluation and management by telemedicine. The patient expressed understanding and agreed to proceed.  Subjective:   Erik Henderson is a 62 y.o. male who presents for Medicare Annual/Subsequent preventive examination.  Review of Systems    Cardiac Risk Factors include: dyslipidemia;male gender;sedentary lifestyle   Objective:    Today's Vitals   01/26/23 1442  Weight: 190 lb (86.2 kg)  Height: 5\' 9"  (1.753 m)   Body mass index is 28.06 kg/m.     01/26/2023    2:54 PM 11/25/2021    9:35 AM 10/29/2020    9:01 AM 03/14/2019    1:48 PM 03/09/2018    3:22 PM 08/07/2017    2:51 PM 11/15/2016    3:33 PM  Advanced Directives  Does Patient Have a Medical Advance Directive? No Yes Yes Yes Yes No Yes  Type of Armed forces logistics/support/administrative officer Power of [place] in Chart?  No - copy requested No - copy requested No - copy requested No - copy requested      Current Medications (verified) Outpatient Encounter Medications as of 01/26/2023  Medication Sig   aspirin EC 81 MG tablet Take 1 tablet (81 mg total) by mouth daily. Swallow whole.   atorvastatin (LIPITOR) 20 MG tablet Take 1 tablet (20 mg total) by mouth daily.   clonazePAM (KLONOPIN) 0.5 MG tablet Take 1 tablet by mouth daily as needed.   Cyanocobalamin (VITAMIN B-12) 1000 MCG SUBL DISSOLVE 1 TABLET UNDER THE TONGUE THREE TIMES A WEEK.   divalproex (DEPAKOTE) 250 MG DR tablet Take 250-750 mg by mouth 3 (three) times daily. 1 breakfast, 1 lunch and 3 at night    traZODone (DESYREL) 100 MG tablet TAKE (1) TABLET BY  MOUTH DAILY AT BEDTIME FOR SLEEP.   escitalopram (LEXAPRO) 20 MG tablet Take 1 tablet by mouth daily.   No facility-administered encounter medications on file as of 01/26/2023.    Allergies (verified) Pollen extract   History: Past Medical History:  Diagnosis Date   Allergy    Anxiety    Autism    Autistic disorder    Mental disorder    Mental retardation    Past Surgical History:  Procedure Laterality Date   CATARACT EXTRACTION W/PHACO Left 08/31/2022   Procedure: CATARACT EXTRACTION PHACO AND INTRAOCULAR LENS PLACEMENT (IOC) LEFT VISION BLUE, HEALON 5 3.91 00:19.9;  Surgeon: Leandrew Koyanagi, MD;  Location: [place] [facility];  Service: Ophthalmology;  Laterality: Left;   MRI     Sedation for MRI   NO PAST SURGERIES     Family History  Problem Relation Age of Onset   Hypertension Mother    Hyperlipidemia Mother    Diabetes Mother    Cancer Mother        stomach   Heart disease Mother    Coronary artery disease Father    Gout Father    Heart failure Father    Cancer Father        prostate   Hypertension Brother    Autism Brother    Mental retardation Brother    Stroke Brother    Social History   Socioeconomic  History   Marital status: Single    Spouse name: Not on file   Number of children: 0   Years of education: Not on file   Highest education level: Never attended school  Occupational History   Occupation: disabled  Tobacco Use   Smoking status: Never   Smokeless tobacco: Never   Tobacco comments:    smoking cessation materials not required  Vaping Use   Vaping Use: Never used  Substance and Sexual Activity   Alcohol use: No    Alcohol/week: 0.0 standard drinks of alcohol   Drug use: No   Sexual activity: Never  Other Topics Concern   Not on file  Social History Narrative   Lives with mother, brothers and sisters; sisters are caregivers; mother is guardian/POA -- family discussing transfer of guardianship/POA to sister as parents in poor  health   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                   Social Determinants of Health   Financial Resource Strain: Low Risk  (01/26/2023)   Overall Financial Resource Strain (CARDIA)    Difficulty of Paying Living Expenses: Not hard at all  Food Insecurity: No Food Insecurity (01/26/2023)   Hunger Vital Sign    Worried About Running Out of Food in the Last Year: Never true    [place] in the Last Year: Never true  Transportation Needs: No Transportation Needs (01/26/2023)   PRAPARE - Hydrologist (Medical): No    Lack of Transportation (Non-Medical): No  Physical Activity: Inactive (01/26/2023)   Exercise Vital Sign    Days of Exercise per Week: 0 days    Minutes of Exercise per Session: 0 min  Stress: No Stress Concern Present (01/26/2023)   [name]    Feeling of Stress : Not at all  Social Connections: Socially Isolated (01/26/2023)   Social Connection and Isolation Panel [NHANES]    Frequency of Communication with Friends and Family: Never    Frequency of Social Gatherings with Friends and Family: Never    Attends Religious Services: Never    Marine scientist or Organizations: Yes    Attends Archivist Meetings: Never    Marital Status: Never married    Tobacco Counseling Counseling given: Not Answered Tobacco comments: smoking cessation materials not required   Clinical Intake:  Pre-visit preparation completed: Yes    BMI - recorded: 28.06 Nutritional Status: BMI 25 -29 Overweight Nutritional Risks: None Diabetes: No  How often do you need to have someone help you when you read instructions, pamphlets, or other written materials from your doctor or pharmacy?: 1 - Never  Diabetic?no  Interpreter Needed?: No  Comments: lives with his mother (caregiver) Information entered by ::  B.Torell Minder,LPN   Activities of Daily Living    01/26/2023    2:54 PM 08/31/2022   12:19 PM  In your present state of health, do you have any difficulty performing the following activities:  Hearing? 0 0  Vision? 0 0  Difficulty concentrating or making decisions? 1 0  Walking or climbing stairs? 0 0  Dressing or bathing? 0 0  Doing errands, shopping? 1   Preparing Food and eating ? Y   Using the Toilet? N   In the past six months, have you accidently leaked urine? N   Do you have problems with  loss of bowel control? N   Managing your Medications? Y   Managing your Finances? Y   Housekeeping or managing your Housekeeping? Y     Patient Care Team: Steele Sizer, MD as PCP - General (Family Medicine) End, Harrell Gave, MD as PCP - Cardiology (Cardiology) Rae Lips, MD as Consulting Physician (Psychiatry) End, Harrell Gave, MD as Consulting Physician (Cardiology)  Indicate any recent Medical Services you may have received from other than [facility] providers in the past year (date may be approximate).     Assessment:   This is a routine wellness examination for Kovin.  Hearing/Vision screen Hearing Screening - Comments:: Adequate hearing Vision Screening - Comments:: Adequate vision [facility]  Dietary issues and exercise activities discussed: Current Exercise Habits: The patient does not participate in regular exercise at present, Exercise limited by: Other - see comments (pt has autism, he is mute;spoke with caretaker (mother))   Goals Addressed               This Visit's Progress     "We need all the help we can get with him. He has behavior problems right now." (pt-stated)   On track     Family caregiver reports worsening obsession with water consumption, decreased food intake and related weight loss and anemia. Caregiver reports that she has spoken with psychiatrist who advises that patient's Lorazepam "might be" increased when he is seen in the office on  10/16. She is to call the neurology office tomorrow to reschedule a missed new patient appointment.   Clinical Goals: Over the next 7 days, patient's sister/caregiver will report receipt of neuropsychology appointment.  Interventions: Provided direction re: reaching out to neurology provider to reschedule appointment; offered support if difficulty encountered with scheduling appointment with specialty provider office; updated primary care provider       "We need to sort out guardianship and power of attorney" (pt-stated)   Not on track     Patient's family/caregiver indicates that mother holds guardianship/POA currently but wishes to transfer guardianship/POA to daughter who is primary caregiver.   Clinical Goals: Over the next 30 days, patient's family/caregivers will complete required documentation for changes in guardianship/POA.   Interventions: Provided sister with information r/t changes in guardianship/POA. Offered to assist with obtaining needed documentation.        DIET - INCREASE WATER INTAKE   Not on track     Recommend to drink at least 6-8 8oz glasses of water per day.       Depression Screen    01/26/2023    2:47 PM 08/18/2022   11:15 AM 07/12/2022   11:18 AM 01/11/2022   11:24 AM 11/25/2021    9:34 AM 07/14/2021   11:03 AM 06/14/2021    8:17 AM  PHQ 2/9 Scores  PHQ - 2 Score 1 0 2 0 0 3 0  PHQ- 9 Score   5   6 0    Fall Risk    01/26/2023    2:45 PM 08/18/2022   11:14 AM 07/12/2022   11:18 AM 01/11/2022   11:24 AM 11/25/2021    9:36 AM  Fall Risk   Falls in the past year? 0 0 0 0 0  Number falls in past yr: 0 0 0 0 0  Injury with Fall? 0 0 0 0 0  Risk for fall due to : No Fall Risks  No Fall Risks No Fall Risks No Fall Risks  Follow up Education provided;Falls prevention discussed Falls evaluation completed  Falls prevention discussed Falls prevention discussed Falls prevention discussed    FALL RISK PREVENTION PERTAINING TO THE HOME:  Any stairs in or around  the home? No  If so, are there any without handrails? No  Home free of loose throw rugs in walkways, pet beds, electrical cords, etc? Yes  Adequate lighting in your home to reduce risk of falls? Yes   ASSISTIVE DEVICES UTILIZED TO PREVENT FALLS:  Life alert? No  Use of a cane, walker or w/c? No  Grab bars in the bathroom? No  Shower chair or bench in shower? No  Elevated toilet seat or a handicapped toilet? Yes   Cognitive Function:        Immunizations Immunization History  Administered Date(s) Administered   Influenza, Seasonal, Injecte, Preservative Fre 10/10/2011, 10/11/2012   Influenza,inj,Quad PF,6+ Mos 11/04/2013, 08/05/2014, 08/17/2015, 09/06/2016, 08/07/2017, 06/20/2018, 09/20/2019, 09/14/2020, 07/14/2021, 07/12/2022   PFIZER Comirnaty(Gray Top)Covid-19 Tri-Sucrose Vaccine 12/18/2020   PFIZER(Purple Top)SARS-COV-2 Vaccination 03/24/2020, 04/17/2020   Pneumococcal Polysaccharide-23 05/10/2012   Tdap 05/10/2011, 07/14/2021    TDAP status: Up to date  Flu Vaccine status: Up to date  Pneumococcal vaccine status: Up to date  Covid-19 vaccine status: Completed vaccines  Qualifies for Shingles Vaccine? Yes   Zostavax completed No   Shingrix Completed?: No.    Education has been provided regarding the importance of this vaccine. Patient has been advised to call insurance company to determine out of pocket expense if they have not yet received this vaccine. Advised may also receive vaccine at local pharmacy or Health Dept. Verbalized acceptance and understanding.  Screening Tests Health Maintenance  Topic Date Due   COLONOSCOPY (Pts 45-2yrs Insurance coverage will need to be confirmed)  Never done   COVID-19 Vaccine (4 - 2023-24 season) 07/01/2022   Zoster Vaccines- Shingrix (1 of 2) 04/12/2023 (Originally 05/22/1980)   HIV Screening  08/10/2029 (Originally 05/22/1976)   Medicare Annual Wellness (AWV)  01/26/2024   DTaP/Tdap/Td (3 - Td or Tdap) 07/15/2031   INFLUENZA  VACCINE  Completed   Hepatitis C Screening  Completed   HPV VACCINES  Aged Out    Health Maintenance  Health Maintenance Due  Topic Date Due   COLONOSCOPY (Pts 45-75yrs Insurance coverage will need to be confirmed)  Never done   COVID-19 Vaccine (4 - 2023-24 season) 07/01/2022    Colorectal cancer screening: Type of screening: Colonoscopy. Completed no. Repeat every - years  Lung Cancer Screening: (Low Dose CT Chest recommended if Age 61-80 years, 30 pack-year currently smoking OR have quit w/in 15years.) does not qualify.   Lung Cancer Screening Referral: no  Additional Screening:  Hepatitis C Screening: does not qualify; Completed yes  Vision Screening: Recommended annual ophthalmology exams for early detection of glaucoma and other disorders of the eye. Is the patient up to date with their annual eye exam?  Yes  Who is the provider or what is the name of the office in which the patient attends annual eye exams? [facility] If pt is not established with a provider, would they like to be referred to a provider to establish care? No .   Dental Screening: Recommended annual dental exams for proper oral hygiene  Community Resource Referral / Chronic Care Management: CRR required this visit?  No   CCM required this visit?  No    Plan:    I have personally reviewed and noted the following in the patient's chart:   Medical and social history Use of alcohol, tobacco or illicit drugs  Current medications and supplements including opioid prescriptions. Patient is not currently taking opioid prescriptions. Functional ability and status Nutritional status Physical activity Advanced directives List of other physicians Hospitalizations, surgeries, and ER visits in previous 12 months Vitals Screenings to include cognitive, depression, and falls Referrals and appointments  In addition, I have reviewed and discussed with patient certain preventive protocols, quality metrics, and  best practice recommendations. A written personalized care plan for preventive services as well as general preventive health recommendations were provided to patient.     Roger Shelter, LPN   [id number]   Nurse Notes: I spoke with mother and caretaker of pt (as pt is autistic and mute). She answers all the questions and relays pt is doing alright: still "running" everywhere. She relays pt is in a hurry to do everything and does not like to walk. She has no concerns or questions regarding his care and disposition.

## 2023-02-02 ENCOUNTER — Other Ambulatory Visit: Payer: Self-pay | Admitting: Family Medicine

## 2023-02-02 DIAGNOSIS — E785 Hyperlipidemia, unspecified: Secondary | ICD-10-CM

## 2023-03-29 DIAGNOSIS — N3944 Nocturnal enuresis: Secondary | ICD-10-CM | POA: Diagnosis not present

## 2023-03-29 DIAGNOSIS — Z79899 Other long term (current) drug therapy: Secondary | ICD-10-CM | POA: Diagnosis not present

## 2023-03-29 DIAGNOSIS — F99 Mental disorder, not otherwise specified: Secondary | ICD-10-CM | POA: Diagnosis not present

## 2023-03-29 DIAGNOSIS — F840 Autistic disorder: Secondary | ICD-10-CM | POA: Diagnosis not present

## 2023-03-29 DIAGNOSIS — F5105 Insomnia due to other mental disorder: Secondary | ICD-10-CM | POA: Diagnosis not present

## 2023-03-29 DIAGNOSIS — F79 Unspecified intellectual disabilities: Secondary | ICD-10-CM | POA: Diagnosis not present

## 2023-03-31 ENCOUNTER — Telehealth: Payer: Self-pay | Admitting: Family Medicine

## 2023-03-31 NOTE — Telephone Encounter (Signed)
Copied from CRM [id number]. Topic: General - Other >> Mar 31, 2023  3:25 PM Carrielelia G wrote: [name] (sister) is calling because of bedwetting, they have tried the suggested different ways to prevent them from wetting the bed but it is not working Is it possible that patient Erik Henderson (and Brother who is wetting the bed too) can get a sooner appointment then what is scheduled,

## 2023-04-03 NOTE — Progress Notes (Addendum)
Name: Erik Henderson   MRN: [id number]    DOB: 1961/02/25   Date:04/04/2023       Progress Note  Subjective  Chief Complaint  Bed Wetting  HPI  Seizure disorder: caregivers states he had been seizure free for many years but had a syncopal episode March 22 , he is taking 250 mg of Depakote in am and 2 in pm. He is getting valproic acid from psychiatrist from [facility]. He tolerates medication well and no side effects, he is taking vitamin D otc, unable to check level since insurance does not cover it    Insulin Resistance:  he is on diabetic diet, weight is stable, he tries to eat a diabetic diet   Dyslipidemia: on life style modification, last LDL was great down to 62    Autism spectrum disorder:He sees psychiatrist [facility] and is taking Depakote plus clonazepam 0.5 TID He has been tolerating medication well He does not speak, he rocks back and forth in his chair, No behavior changes except for nocturnal enuresis .    Anemia: he is not a good candidate for colon cancer screen, family states it will be difficulty to have colonoscopy and were not able to do the hemoccult cards or cologuard.  He is taking iron supplementation daily and last HCT was back to normal    HIstory of B12 deficiency : he is taking  b12 1000 mcg three times a week , we wll recheck it yearly   Nocturnal enuresis: his father died in 2024-01-16and since than he started to developed nocturnal enuresis, it has happened in the past when his brother died but never lasted this am. No accidents during the day, no change in bowel movements. Behavior has not changed. He had an ua done by psychiatrist last week that was normal.    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   Seizure (HCC) 04/16/2019   Behavior concern in adult 09/22/2018    Behavioral change 06/27/2018   Intellectual disability 06/27/2018   Hyperglycemia 08/17/2015   Anxiety 04/14/2015   Hyperlipidemia LDL goal <70 04/14/2015   Cognitive impairment 04/14/2015   Autism spectrum disorder 02/23/2015   Hay fever 02/21/2008    Past Surgical History:  Procedure Laterality Date   CATARACT EXTRACTION W/PHACO Left 08/31/2022   Procedure: CATARACT EXTRACTION PHACO AND INTRAOCULAR LENS PLACEMENT (IOC) LEFT VISION BLUE, HEALON 5 3.91 00:19.9;  Surgeon: Lockie Mola, MD;  [facility];  Service: Ophthalmology;  Laterality: Left;   MRI     Sedation for MRI   NO PAST SURGERIES      Family History  Problem Relation Age of Onset   Hypertension Mother    Hyperlipidemia Mother    Diabetes Mother    Cancer Mother        stomach   Heart disease Mother    Coronary artery disease Father    Gout Father    Heart failure Father    Cancer Father        prostate   Hypertension Brother    Autism Brother    Mental retardation Brother    Stroke Brother     Social History   Tobacco Use   Smoking status: Never   Smokeless tobacco: Never   Tobacco comments:    smoking cessation materials  not required  Substance Use Topics   Alcohol use: No    Alcohol/week: 0.0 standard drinks of alcohol     Current Outpatient Medications:    aspirin EC 81 MG tablet, Take 1 tablet (81 mg total) by mouth daily. Swallow whole., Disp: , Rfl:    atorvastatin (LIPITOR) 20 MG tablet, TAKE ONE TABLET BY MOUTH ONCE DAILY, Disp: 90 tablet, Rfl: 1   clonazePAM (KLONOPIN) 0.5 MG tablet, Take 1 tablet by mouth daily as needed., Disp: , Rfl:    Cyanocobalamin (VITAMIN B-12) 1000 MCG SUBL, DISSOLVE 1 TABLET UNDER THE TONGUE THREE TIMES A WEEK., Disp: 12 tablet, Rfl: 5   divalproex (DEPAKOTE) 250 MG DR tablet, Take 250-750 mg by mouth 3 (three) times daily. 1 breakfast, 1 lunch and 3 at night , Disp: , Rfl:    oxybutynin (DITROPAN) 5 MG tablet, Take 1 tablet (5 mg total)  by mouth at bedtime as needed for bladder spasms., Disp: 30 tablet, Rfl: 0   traZODone (DESYREL) 100 MG tablet, TAKE (1) TABLET BY MOUTH DAILY AT BEDTIME FOR SLEEP., Disp: , Rfl:    escitalopram (LEXAPRO) 20 MG tablet, Take 1 tablet by mouth daily., Disp: , Rfl:   Allergies  Allergen Reactions   Pollen Extract     Other reaction(s): Other (See Comments) Patent allergic to dust, that causes pt to sneeze.    I personally reviewed active problem list, medication list, allergies, family history, social history, health maintenance with the patient/caregiver today.   ROS  Ten systems reviewed and is negative except as mentioned in HPI   Objective  Vitals:   04/04/23 1027  BP: 124/80  Pulse: 79  Resp: 18  Temp: 98.4 F (36.9 C)  TempSrc: Oral  SpO2: 95%  Weight: 192 lb 3.2 oz (87.2 kg)  Height: 5\' 9"  (1.753 m)    Body mass index is 28.38 kg/m.  Physical Exam  Constitutional: Patient appears well-developed and well-nourished.  No distress.  HEENT: head atraumatic, normocephalic, pupils equal and reactive to light, neck supple Cardiovascular: Normal rate, regular rhythm and normal heart sounds.  No murmur heard. 1 plus pitting  BLE edema. Pulmonary/Chest: Effort normal and breath sounds normal. No respiratory distress. Abdominal: Soft.  There is no tenderness. Psychiatric: Cooperative , calm. Not rocking today.   PHQ2/9:    01/26/2023    2:47 PM 08/18/2022   11:15 AM 07/12/2022   11:18 AM 01/11/2022   11:24 AM 11/25/2021    9:34 AM  Depression screen PHQ 2/9  Decreased Interest 1 0 2 0 0  Down, Depressed, Hopeless 0 0 0 0 0  PHQ - 2 Score 1 0 2 0 0  Altered sleeping   0    Tired, decreased energy   0    Change in appetite   0    Feeling bad or failure about yourself    0    Trouble concentrating   3    Moving slowly or fidgety/restless   0    Suicidal thoughts   0    PHQ-9 Score   5      phq 9 is sees psychiatrist, non verbal   Fall Risk:    04/04/2023   10:28  AM 01/26/2023    2:45 PM 08/18/2022   11:14 AM 07/12/2022   11:18 AM 01/11/2022   11:24 AM  Fall Risk   Falls in the past year? 0 0 0 0 0  Number falls in past yr:  0 0 0  0  Injury with Fall?  0 0 0 0  Risk for fall due to : No Fall Risks No Fall Risks  No Fall Risks No Fall Risks  Follow up Falls prevention discussed Education provided;Falls prevention discussed Falls evaluation completed Falls prevention discussed Falls prevention discussed     Assessment & Plan  1. Nocturnal enuresis  - oxybutynin (DITROPAN) 5 MG tablet; Take 1 tablet (5 mg total) by mouth at bedtime as needed for bladder spasms.  Dispense: 30 tablet; Refill: 0 Discussed voiding before bed, monitor for constipation, decrease fluid intake at night   2. Seizure (HCC)  Doing well  3. Autism  Keep follow up with psychiatrist   4. B12 deficiency  Continue supplementation   5. Dyslipidemia   6. Anxiety  Stable

## 2023-04-03 NOTE — Telephone Encounter (Signed)
Appt scheduled for tomorrow.  °

## 2023-04-04 ENCOUNTER — Ambulatory Visit (INDEPENDENT_AMBULATORY_CARE_PROVIDER_SITE_OTHER): Payer: Medicare Other | Admitting: Family Medicine

## 2023-04-04 ENCOUNTER — Encounter: Payer: Self-pay | Admitting: Family Medicine

## 2023-04-04 VITALS — BP 124/80 | HR 79 | Temp 98.4°F | Resp 18 | Ht 69.0 in | Wt 192.2 lb

## 2023-04-04 DIAGNOSIS — R569 Unspecified convulsions: Secondary | ICD-10-CM | POA: Diagnosis not present

## 2023-04-04 DIAGNOSIS — F419 Anxiety disorder, unspecified: Secondary | ICD-10-CM

## 2023-04-04 DIAGNOSIS — E785 Hyperlipidemia, unspecified: Secondary | ICD-10-CM | POA: Diagnosis not present

## 2023-04-04 DIAGNOSIS — E538 Deficiency of other specified B group vitamins: Secondary | ICD-10-CM | POA: Diagnosis not present

## 2023-04-04 DIAGNOSIS — N3944 Nocturnal enuresis: Secondary | ICD-10-CM | POA: Diagnosis not present

## 2023-04-04 DIAGNOSIS — F840 Autistic disorder: Secondary | ICD-10-CM | POA: Diagnosis not present

## 2023-04-04 MED ORDER — OXYBUTYNIN CHLORIDE 5 MG PO TABS: 5.0000 mg | ORAL_TABLET | Freq: Every evening | ORAL | 0 refills | Status: DC | PRN

## 2023-05-03 ENCOUNTER — Other Ambulatory Visit: Payer: Self-pay | Admitting: Family Medicine

## 2023-05-03 DIAGNOSIS — N3944 Nocturnal enuresis: Secondary | ICD-10-CM

## 2023-05-24 ENCOUNTER — Other Ambulatory Visit: Payer: Self-pay | Admitting: Family Medicine

## 2023-05-24 DIAGNOSIS — E538 Deficiency of other specified B group vitamins: Secondary | ICD-10-CM

## 2023-07-12 DIAGNOSIS — F99 Mental disorder, not otherwise specified: Secondary | ICD-10-CM | POA: Diagnosis not present

## 2023-07-12 DIAGNOSIS — F79 Unspecified intellectual disabilities: Secondary | ICD-10-CM | POA: Diagnosis not present

## 2023-07-12 DIAGNOSIS — F5105 Insomnia due to other mental disorder: Secondary | ICD-10-CM | POA: Diagnosis not present

## 2023-07-12 DIAGNOSIS — Z79899 Other long term (current) drug therapy: Secondary | ICD-10-CM | POA: Diagnosis not present

## 2023-07-12 DIAGNOSIS — F840 Autistic disorder: Secondary | ICD-10-CM | POA: Diagnosis not present

## 2023-08-09 NOTE — Progress Notes (Unsigned)
Name: Erik Henderson   MRN: [id number]    DOB: [birth year]   Date:08/09/2023       Progress Note  Subjective  Chief Complaint  Follow Up  HPI  Seizure disorder: caregivers states he had been seizure free for many years but had a syncopal episode March 22 , he is taking 250 mg of Depakote in am and 2 in pm. He is getting valproic acid from psychiatrist from [facility]. 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    Anemia: he is not a good candidate for colon cancer screen, family states it will be difficulty to have colonoscopy and were not able to do the hemoccult cards or cologuard.  He is taking iron supplementation daily and last HCT was back to normal    HIstory of B12 deficiency : he is taking  b12 1000 mcg three times a week , we wll recheck it yearly   Nocturnal enuresis: his father died in 01-05-24and since than he started to developed nocturnal enuresis, it has happened in the past when his brother died but never lasted this am. No accidents during the day, no change in bowel movements. Behavior has not changed. He had an ua done by psychiatrist last week that was normal.   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   Seizure (HCC) 04/16/2019   Behavior concern in adult 09/22/2018   Behavioral  change 06/27/2018   Intellectual disability 06/27/2018   Hyperglycemia 08/17/2015   Anxiety 04/14/2015   Hyperlipidemia LDL goal <70 04/14/2015   Cognitive impairment 04/14/2015   Autism spectrum disorder 02/23/2015   Hay fever 02/21/2008    Past Surgical History:  Procedure Laterality Date   CATARACT EXTRACTION W/PHACO Left 08/31/2022   Procedure: CATARACT EXTRACTION PHACO AND INTRAOCULAR LENS PLACEMENT (IOC) LEFT VISION BLUE, HEALON 5 3.91 00:19.9;  Surgeon: Lockie Mola, MD;  Location: [facility];  Service: Ophthalmology;  Laterality: Left;   MRI     Sedation for MRI   NO PAST SURGERIES      Family History  Problem Relation Age of Onset   Hypertension Mother    Hyperlipidemia Mother    Diabetes Mother    Cancer Mother        stomach   Heart disease Mother    Coronary artery disease Father    Gout Father    Heart failure Father    Cancer Father        prostate   Hypertension Brother    Autism Brother    Mental retardation Brother    Stroke Brother     Social History   Tobacco Use   Smoking status: Never   Smokeless tobacco: Never   Tobacco comments:    smoking cessation materials not  required  Substance Use Topics   Alcohol use: No    Alcohol/week: 0.0 standard drinks of alcohol     Current Outpatient Medications:    aspirin EC 81 MG tablet, Take 1 tablet (81 mg total) by mouth daily. Swallow whole., Disp: , Rfl:    atorvastatin (LIPITOR) 20 MG tablet, TAKE ONE TABLET BY MOUTH ONCE DAILY, Disp: 90 tablet, Rfl: 1   clonazePAM (KLONOPIN) 0.5 MG tablet, Take 1 tablet by mouth daily as needed., Disp: , Rfl:    Cyanocobalamin (VITAMIN B-12) 1000 MCG SUBL, DISSOLVE 1 TABLET UNDER THE TONGUE THREE TIMES A WEEK., Disp: 12 tablet, Rfl: 5   divalproex (DEPAKOTE) 250 MG DR tablet, Take 250-750 mg by mouth 3 (three) times daily. 1 breakfast, 1 lunch and 3 at night , Disp: , Rfl:    escitalopram (LEXAPRO) 20 MG tablet, Take 1 tablet by mouth daily., Disp:  , Rfl:    oxybutynin (DITROPAN) 5 MG tablet, TAKE ONE TABLET BY MOUTH AT BEDTIME AS NEEDED FOR FOR BLADDER SPASM, Disp: 30 tablet, Rfl: 5   traZODone (DESYREL) 100 MG tablet, TAKE (1) TABLET BY MOUTH DAILY AT BEDTIME FOR SLEEP., Disp: , Rfl:   Allergies  Allergen Reactions   Pollen Extract     Other reaction(s): Other (See Comments) Patent allergic to dust, that causes pt to sneeze.    I personally reviewed active problem list, medication list, allergies, family history, social history with the patient/caregiver today.   ROS  ***  Objective  There were no vitals filed for this visit.  There is no height or weight on file to calculate BMI.  Physical Exam ***  No results found for this or any previous visit (from the past 2160 hour(s)).   PHQ2/9:    01/26/2023    2:47 PM 08/18/2022   11:15 AM 07/12/2022   11:18 AM 01/11/2022   11:24 AM 11/25/2021    9:34 AM  Depression screen PHQ 2/9  Decreased Interest 1 0 2 0 0  Down, Depressed, Hopeless 0 0 0 0 0  PHQ - 2 Score 1 0 2 0 0  Altered sleeping   0    Tired, decreased energy   0    Change in appetite   0    Feeling bad or failure about yourself    0    Trouble concentrating   3    Moving slowly or fidgety/restless   0    Suicidal thoughts   0    PHQ-9 Score   5      phq 9 is {gen pos [id number]}   Fall Risk:    04/04/2023   10:28 AM 01/26/2023    2:45 PM 08/18/2022   11:14 AM 07/12/2022   11:18 AM 01/11/2022   11:24 AM  Fall Risk   Falls in the past year? 0 0 0 0 0  Number falls in past yr:  0 0 0 0  Injury with Fall?  0 0 0 0  Risk for fall due to : No Fall Risks No Fall Risks  No Fall Risks No Fall Risks  Follow up Falls prevention discussed Education provided;Falls prevention discussed Falls evaluation completed Falls prevention discussed Falls prevention discussed      Functional Status Survey:      Assessment & Plan  *** There are no diagnoses linked to this encounter.

## 2023-08-10 ENCOUNTER — Ambulatory Visit (INDEPENDENT_AMBULATORY_CARE_PROVIDER_SITE_OTHER): Payer: Medicare HMO | Admitting: Family Medicine

## 2023-08-10 ENCOUNTER — Encounter: Payer: Self-pay | Admitting: Family Medicine

## 2023-08-10 VITALS — BP 118/78 | HR 94 | Resp 16 | Ht 69.0 in | Wt 200.0 lb

## 2023-08-10 DIAGNOSIS — Z23 Encounter for immunization: Secondary | ICD-10-CM | POA: Diagnosis not present

## 2023-08-10 DIAGNOSIS — N3944 Nocturnal enuresis: Secondary | ICD-10-CM | POA: Diagnosis not present

## 2023-08-10 DIAGNOSIS — F840 Autistic disorder: Secondary | ICD-10-CM

## 2023-08-10 DIAGNOSIS — R569 Unspecified convulsions: Secondary | ICD-10-CM

## 2023-08-10 DIAGNOSIS — E785 Hyperlipidemia, unspecified: Secondary | ICD-10-CM

## 2023-08-10 DIAGNOSIS — E538 Deficiency of other specified B group vitamins: Secondary | ICD-10-CM | POA: Diagnosis not present

## 2023-08-10 DIAGNOSIS — D649 Anemia, unspecified: Secondary | ICD-10-CM | POA: Diagnosis not present

## 2023-08-10 MED ORDER — OXYBUTYNIN CHLORIDE 5 MG PO TABS: 5.0000 mg | ORAL_TABLET | Freq: Every evening | ORAL | 1 refills | Status: DC

## 2023-08-10 MED ORDER — ATORVASTATIN CALCIUM 20 MG PO TABS: 20.0000 mg | ORAL_TABLET | Freq: Every day | ORAL | 1 refills | Status: DC

## 2023-08-10 NOTE — Patient Instructions (Signed)
RSV and Shingrix at local pharmacy

## 2023-12-12 ENCOUNTER — Ambulatory Visit: Payer: Medicare HMO | Admitting: Family Medicine

## 2024-01-29 ENCOUNTER — Encounter: Payer: Medicare HMO | Admitting: Family Medicine

## 2024-02-01 ENCOUNTER — Ambulatory Visit: Payer: Self-pay

## 2024-02-01 DIAGNOSIS — Z0000 Encounter for general adult medical examination without abnormal findings: Secondary | ICD-10-CM | POA: Diagnosis not present

## 2024-02-01 NOTE — Progress Notes (Signed)
 Subjective:   Erik Henderson is a 63 y.o. who presents for a Medicare Wellness preventive visit.  Visit Complete: Virtual I connected with  Erik Henderson on 02/01/24 by a audio enabled telemedicine application and verified that I am speaking with the correct person using two identifiers. Spoke w/ [name], his sister  Patient Location: Home  Provider Location: Office/Clinic  I discussed the limitations of evaluation and management by telemedicine. The patient expressed understanding and agreed to proceed.  Vital Signs: Because this visit was a virtual/telehealth visit, some criteria may be missing or patient reported. Any vitals not documented were not able to be obtained and vitals that have been documented are patient reported.  VideoDeclined- This patient declined Librarian, academic. Therefore the visit was completed with audio only.  Persons Participating in Visit: Patient.  AWV Questionnaire: No: Patient Medicare AWV questionnaire was not completed prior to this visit.  Cardiac Risk Factors include: advanced age (>13men, >17 women);dyslipidemia;male gender     Objective:    There were no vitals filed for this visit. There is no height or weight on file to calculate BMI.     02/01/2024    1:59 PM 01/26/2023    2:54 PM 11/25/2021    9:35 AM 10/29/2020    9:01 AM 03/14/2019    1:48 PM 03/09/2018    3:22 PM 08/07/2017    2:51 PM  Advanced Directives  Does Patient Have a Medical Advance Directive? No No Yes Yes Yes Yes No  Type of Pension scheme manager Power of [employer] Power of Attorney   Copy of Healthcare Power of Attorney in Chart?   No - copy requested No - copy requested No - copy requested No - copy requested   Would patient like information on creating a medical advance directive? No - Patient declined          Current Medications (verified) Outpatient Encounter  Medications as of 02/01/2024  Medication Sig   aspirin EC 81 MG tablet Take 1 tablet (81 mg total) by mouth daily. Swallow whole.   atorvastatin (LIPITOR) 20 MG tablet Take 1 tablet (20 mg total) by mouth daily.   clonazePAM (KLONOPIN) 0.5 MG tablet Take 1 tablet by mouth daily as needed.   Cyanocobalamin (VITAMIN B-12) 1000 MCG SUBL DISSOLVE 1 TABLET UNDER THE TONGUE THREE TIMES A WEEK.   divalproex (DEPAKOTE) 250 MG DR tablet Take 250-750 mg by mouth 3 (three) times daily. 1 breakfast, 1 lunch and 3 at night    oxybutynin (DITROPAN) 5 MG tablet Take 1 tablet (5 mg total) by mouth every evening.   traZODone (DESYREL) 100 MG tablet TAKE (1) TABLET BY MOUTH DAILY AT BEDTIME FOR SLEEP.   escitalopram (LEXAPRO) 20 MG tablet Take 1 tablet by mouth daily.   No facility-administered encounter medications on file as of 02/01/2024.    Allergies (verified) Pollen extract   History: Past Medical History:  Diagnosis Date   Allergy    Anxiety    Autism    Autistic disorder    Mental disorder    Mental retardation    Past Surgical History:  Procedure Laterality Date   CATARACT EXTRACTION W/PHACO Left 08/31/2022   Procedure: CATARACT EXTRACTION PHACO AND INTRAOCULAR LENS PLACEMENT (IOC) LEFT VISION BLUE, HEALON 5 3.91 00:19.9;  Surgeon: Lockie Mola, MD;  Location: [facility];  Service: Ophthalmology;  Laterality: Left;   MRI     Sedation  for MRI   NO PAST SURGERIES     Family History  Problem Relation Age of Onset   Hypertension Mother    Hyperlipidemia Mother    Diabetes Mother    Cancer Mother        stomach   Heart disease Mother    Coronary artery disease Father    Gout Father    Heart failure Father    Cancer Father        prostate   Hypertension Brother    Autism Brother    Mental retardation Brother    Stroke Brother    Social History   Socioeconomic History   Marital status: Single    Spouse name: Not on file   Number of children: 0   Years of  education: Not on file   Highest education level: Never attended school  Occupational History   Occupation: disabled  Tobacco Use   Smoking status: Never   Smokeless tobacco: Never   Tobacco comments:    smoking cessation materials not required  Vaping Use   Vaping status: Never Used  Substance and Sexual Activity   Alcohol use: No    Alcohol/week: 0.0 standard drinks of alcohol   Drug use: No   Sexual activity: Never  Other Topics Concern   Not on file  Social History Narrative   Lives with mother, brothers and sisters; sisters are caregivers; mother is guardian/POA -- family discussing transfer of guardianship/POA to sister as parents in poor health   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                   Social Drivers of Health   Financial Resource Strain: Low Risk  (02/01/2024)   Overall Financial Resource Strain (CARDIA)    Difficulty of Paying Living Expenses: Not hard at all  Food Insecurity: No Food Insecurity (02/01/2024)   Hunger Vital Sign    Worried About Running Out of Food in the Last Year: Never true    Ran Out of Food in the Last Year: Never true  Transportation Needs: No Transportation Needs (02/01/2024)   PRAPARE - Administrator, Civil Service (Medical): No    Lack of Transportation (Non-Medical): No  Physical Activity: Insufficiently Active (02/01/2024)   Exercise Vital Sign    Days of Exercise per Week: 3 days    Minutes of Exercise per Session: 30 min  Stress: No Stress Concern Present (02/01/2024)   [employer] of Occupational Health - Occupational Stress Questionnaire    Feeling of Stress : Not at all  Social Connections: Socially Isolated (02/01/2024)   Social Connection and Isolation Panel [NHANES]    Frequency of Communication with Friends and Family: Never    Frequency of Social Gatherings with Friends and Family: More than three times a week    Attends Religious Services: Never    Doctor, general practice or Organizations: No    Attends Engineer, structural: Never    Marital Status: Never married    Tobacco Counseling Counseling given: Not Answered Tobacco comments: smoking cessation materials not required    Clinical Intake:  Pre-visit preparation completed: Yes  Pain : No/denies pain     BMI - recorded: 29.5 Nutritional Status: BMI 25 -29 Overweight Nutritional Risks: None Diabetes: No  Lab Results  Component Value Date   HGBA1C 5.2 02/20/2020   HGBA1C 5.2 04/16/2019   HGBA1C 5.5 12/07/2017     How  often do you need to have someone help you when you read instructions, pamphlets, or other written materials from your doctor or pharmacy?: 5 - Always  Interpreter Needed?: No  Information entered by :: Kennedy Bucker, LPN   Activities of Daily Living     02/01/2024    2:00 PM 08/10/2023   10:24 AM  In your present state of health, do you have any difficulty performing the following activities:  Hearing? 0 0  Vision? 0 0  Difficulty concentrating or making decisions? 1 0  Walking or climbing stairs? 0 0  Dressing or bathing? 1 1  Doing errands, shopping? 1 1  Preparing Food and eating ? Y   Using the Toilet? Y   In the past six months, have you accidently leaked urine? N   Do you have problems with loss of bowel control? N   Managing your Medications? Y   Managing your Finances? Y   Housekeeping or managing your Housekeeping? Y     Patient Care Team: Alba Cory, MD as PCP - General (Family Medicine) End, Cristal Deer, MD as PCP - Cardiology (Cardiology) Dennison Bulla, MD as Consulting Physician (Psychiatry) End, Cristal Deer, MD as Consulting Physician (Cardiology) Pa, [facility] (Optometry)  Indicate any recent Medical Services you may have received from other than [facility] providers in the past year (date may be approximate).     Assessment:   This is a routine wellness examination for Saivon.  Hearing/Vision screen Hearing  Screening - Comments:: NO AIDS Vision Screening - Comments:: NO GLASSES   Goals Addressed             This Visit's Progress    DIET - EAT MORE FRUITS AND VEGETABLES         Depression Screen     02/01/2024    1:58 PM 08/10/2023   10:24 AM 01/26/2023    2:47 PM 08/18/2022   11:15 AM 07/12/2022   11:18 AM 01/11/2022   11:24 AM 11/25/2021    9:34 AM  PHQ 2/9 Scores  PHQ - 2 Score 0 2 1 0 2 0 0  PHQ- 9 Score 0 2   5      Fall Risk     02/01/2024    2:00 PM 08/10/2023   10:24 AM 04/04/2023   10:28 AM 01/26/2023    2:45 PM 08/18/2022   11:14 AM  Fall Risk   Falls in the past year? 0 0 0 0 0  Number falls in past yr: 0 0  0 0  Injury with Fall? 0 0  0 0  Risk for fall due to : No Fall Risks No Fall Risks No Fall Risks No Fall Risks   Follow up Falls prevention discussed;Falls evaluation completed Falls prevention discussed Falls prevention discussed Education provided;Falls prevention discussed Falls evaluation completed    MEDICARE RISK AT HOME:  Medicare Risk at Home Any stairs in or around the home?: No If so, are there any without handrails?: No Home free of loose throw rugs in walkways, pet beds, electrical cords, etc?: Yes Adequate lighting in your home to reduce risk of falls?: Yes Life alert?: No Use of a cane, walker or w/c?: No Grab bars in the bathroom?: Yes Shower chair or bench in shower?: No Elevated toilet seat or a handicapped toilet?: Yes  TIMED UP AND GO:  Was the test performed?  No  Cognitive Function: PT UNABLE TO TEST- HE IS MUTE  Immunizations Immunization History  Administered Date(s) Administered   Influenza, Seasonal, Injecte, Preservative Fre 10/10/2011, 10/11/2012, 08/10/2023   Influenza,inj,Quad PF,6+ Mos 11/04/2013, 08/05/2014, 08/17/2015, 09/06/2016, 08/07/2017, 06/20/2018, 09/20/2019, 09/14/2020, 07/14/2021, 07/12/2022   PFIZER Comirnaty(Gray Top)Covid-19 Tri-Sucrose Vaccine 12/18/2020   PFIZER(Purple Top)SARS-COV-2  Vaccination 03/24/2020, 04/17/2020   Pneumococcal Polysaccharide-23 05/10/2012   Tdap 05/10/2011, 07/14/2021    Screening Tests Health Maintenance  Topic Date Due   Zoster Vaccines- Shingrix (1 of 2) Never done   COVID-19 Vaccine (4 - 2024-25 season) 07/02/2023   Colonoscopy  04/03/2024 (Originally 05/22/2006)   HIV Screening  08/10/2029 (Originally [birth year]/22/1976)   INFLUENZA VACCINE  05/31/2024   Medicare Annual Wellness (AWV)  01/31/2025   DTaP/Tdap/Td (3 - Td or Tdap) 07/15/2031   Hepatitis C Screening  Completed   Pneumococcal Vaccine 27-74 Years old  Aged Out   HPV VACCINES  Aged Out    Health Maintenance  Health Maintenance Due  Topic Date Due   Zoster Vaccines- Shingrix (1 of 2) Never done   COVID-19 Vaccine (4 - 2024-25 season) 07/02/2023   Health Maintenance Items Addressed: DECLINES COLONOSCOPY REFERRAL; NEEDS SHINGRIX AND PNA SHOT  Additional Screening:  Vision Screening: Recommended annual ophthalmology exams for early detection of glaucoma and other disorders of the eye.  Dental Screening: Recommended annual dental exams for proper oral hygiene  Community Resource Referral / Chronic Care Management: CRR required this visit?  No   CCM required this visit?  No     Plan:     I have personally reviewed and noted the following in the patient's chart:   Medical and social history Use of alcohol, tobacco or illicit drugs  Current medications and supplements including opioid prescriptions. Patient is not currently taking opioid prescriptions. Functional ability and status Nutritional status Physical activity Advanced directives List of other physicians Hospitalizations, surgeries, and ER visits in previous 12 months Vitals Screenings to include cognitive, depression, and falls Referrals and appointments  In addition, I have reviewed and discussed with patient certain preventive protocols, quality metrics, and best practice recommendations. A written  personalized care plan for preventive services as well as general preventive health recommendations were provided to patient.     Hal Hope, LPN   11/05/1094   After Visit Summary: (MyChart) Due to this being a telephonic visit, the after visit summary with patients personalized plan was offered to patient via MyChart   Notes: Nothing significant to report at this time.

## 2024-02-01 NOTE — Patient Instructions (Addendum)
 Erik Henderson , Thank you for taking time to come for your Medicare Wellness Visit. I appreciate your ongoing commitment to your health goals. Please review the following plan we discussed and let me know if I can assist you in the future.   Referrals/Orders/Follow-Ups/Clinician Recommendations: NONE  This is a list of the screening recommended for you and due dates:  Health Maintenance  Topic Date Due   Zoster (Shingles) Vaccine (1 of 2) Never done   COVID-19 Vaccine (4 - 2024-25 season) 07/02/2023   Colon Cancer Screening  04/03/2024*   HIV Screening  08/10/2029*   Flu Shot  05/31/2024   Medicare Annual Wellness Visit  01/31/2025   DTaP/Tdap/Td vaccine (3 - Td or Tdap) 07/15/2031   Hepatitis C Screening  Completed   Pneumococcal Vaccination  Aged Out   HPV Vaccine  Aged Out  *Topic was postponed. The date shown is not the original due date.    Advanced directives: (ACP Link)Information on Advanced Care Planning can be found at [facility] Advance Health Care Directives Advance Health Care Directives. [email or web address]   Next Medicare Annual Wellness Visit scheduled for next year: Yes   02/06/25 @ 1:50 PM BY PHONE

## 2024-02-05 ENCOUNTER — Encounter: Payer: Medicare HMO | Admitting: Family Medicine

## 2024-03-01 ENCOUNTER — Other Ambulatory Visit: Payer: Self-pay | Admitting: Family Medicine

## 2024-03-01 DIAGNOSIS — E785 Hyperlipidemia, unspecified: Secondary | ICD-10-CM

## 2024-03-07 ENCOUNTER — Encounter: Admitting: Family Medicine

## 2024-04-11 ENCOUNTER — Other Ambulatory Visit: Payer: Self-pay | Admitting: Family Medicine

## 2024-04-11 DIAGNOSIS — N3944 Nocturnal enuresis: Secondary | ICD-10-CM

## 2024-05-01 ENCOUNTER — Other Ambulatory Visit: Payer: Self-pay | Admitting: Family Medicine

## 2024-05-01 DIAGNOSIS — N3944 Nocturnal enuresis: Secondary | ICD-10-CM

## 2024-05-01 DIAGNOSIS — E785 Hyperlipidemia, unspecified: Secondary | ICD-10-CM

## 2024-05-01 NOTE — Telephone Encounter (Signed)
 Copied from CRM [id number]. Topic: Clinical - Medication Refill >> May 01, 2024  2:31 PM Montie POUR wrote: Medication: atorvastatin  (LIPITOR) 20 MG tablet AND oxybutynin  (DITROPAN ) 5 MG tablet  Has the patient contacted their pharmacy? Yes (Agent: If no, request that the patient contact the pharmacy for the refill. If patient does not wish to contact the pharmacy document the reason why and proceed with request.) (Agent: If yes, when and what did the pharmacy advise?) Pharmacy needs order to refill  This is the patient's preferred pharmacy:  [facility] - [place], [place] - [address] [place] [address] Phone: [phone] Fax: [phone]  Is this the correct pharmacy for this prescription? Yes If no, delete pharmacy and type the correct one.   Has the prescription been filled recently? No  Is the patient out of the medication? Yes  Has the patient been seen for an appointment in the last year OR does the patient have an upcoming appointment? Yes  Can we respond through MyChart? No  Agent: Please be advised that Rx refills may take up to 3 business days. We ask that you follow-up with your pharmacy.

## 2024-05-03 MED ORDER — OXYBUTYNIN CHLORIDE 5 MG PO TABS: 5.0000 mg | ORAL_TABLET | Freq: Every evening | ORAL | 0 refills | Status: DC

## 2024-05-03 NOTE — Telephone Encounter (Signed)
 Requested Prescriptions  Pending Prescriptions Disp Refills   atorvastatin  (LIPITOR) 20 MG tablet 30 tablet 0    Sig: Take 1 tablet (20 mg total) by mouth daily.     Cardiovascular:  Antilipid - Statins Failed - 05/03/2024  5:16 PM      Failed - Lipid Panel in normal range within the last 12 months    Cholesterol  Date Value Ref Range Status  07/12/2022 127 <200 mg/dL Final   LDL Cholesterol (Calc)  Date Value Ref Range Status  07/12/2022 62 mg/dL (calc) Final    Comment:    Reference range: <100 . Desirable range <100 mg/dL for primary prevention;   <70 mg/dL for patients with CHD or diabetic patients  with > or = 2 CHD risk factors. [name] LDL-C is now calculated using the Martin-Hopkins  calculation, which is a validated novel method providing  better accuracy than the Friedewald equation in the  estimation of LDL-C.  [name] [name] et al. [name] 7986;689(80): 2061-2068  (http://education.[facility].com/faq/FAQ164)    HDL  Date Value Ref Range Status  07/12/2022 48 > OR = 40 mg/dL Final   Triglycerides  Date Value Ref Range Status  07/12/2022 85 <150 mg/dL Final         Passed - Patient is not pregnant      Passed - Valid encounter within last 12 months    Recent Outpatient Visits   None             oxybutynin  (DITROPAN ) 5 MG tablet 90 tablet 0    Sig: Take 1 tablet (5 mg total) by mouth every evening.     Urology:  Bladder Agents Passed - 05/03/2024  5:16 PM      Passed - Valid encounter within last 12 months    Recent Outpatient Visits   None

## 2024-05-03 NOTE — Telephone Encounter (Signed)
 Requested medications are due for refill today.  yes  Requested medications are on the active medications list.  yes  Last refill. 03/01/2024 #30 0 rf  Future visit scheduled.   yes  Notes to clinic.  Labs are expired.    Requested Prescriptions  Pending Prescriptions Disp Refills   atorvastatin  (LIPITOR) 20 MG tablet 30 tablet 0    Sig: Take 1 tablet (20 mg total) by mouth daily.     Cardiovascular:  Antilipid - Statins Failed - 05/03/2024  5:17 PM      Failed - Lipid Panel in normal range within the last 12 months    Cholesterol  Date Value Ref Range Status  07/12/2022 127 <200 mg/dL Final   LDL Cholesterol (Calc)  Date Value Ref Range Status  07/12/2022 62 mg/dL (calc) Final    Comment:    Reference range: <100 . Desirable range <100 mg/dL for primary prevention;   <70 mg/dL for patients with CHD or diabetic patients  with > or = 2 CHD risk factors. [name] LDL-C is now calculated using the Martin-Hopkins  calculation, which is a validated novel method providing  better accuracy than the Friedewald equation in the  estimation of LDL-C.  [name] [name] et al. [name] 7986;689(80): 2061-2068  (http://education.[facility].com/faq/FAQ164)    HDL  Date Value Ref Range Status  07/12/2022 48 > OR = 40 mg/dL Final   Triglycerides  Date Value Ref Range Status  07/12/2022 85 <150 mg/dL Final         Passed - Patient is not pregnant      Passed - Valid encounter within last 12 months    Recent Outpatient Visits   None            Signed Prescriptions Disp Refills   oxybutynin  (DITROPAN ) 5 MG tablet 30 tablet 0    Sig: Take 1 tablet (5 mg total) by mouth every evening.     Urology:  Bladder Agents Passed - 05/03/2024  5:17 PM      Passed - Valid encounter within last 12 months    Recent Outpatient Visits   None

## 2024-05-06 MED ORDER — ATORVASTATIN CALCIUM 20 MG PO TABS: 20.0000 mg | ORAL_TABLET | Freq: Every day | ORAL | 0 refills | Status: DC

## 2024-05-10 ENCOUNTER — Other Ambulatory Visit: Payer: Self-pay | Admitting: Family Medicine

## 2024-05-10 DIAGNOSIS — N3944 Nocturnal enuresis: Secondary | ICD-10-CM

## 2024-05-10 DIAGNOSIS — E785 Hyperlipidemia, unspecified: Secondary | ICD-10-CM

## 2024-05-20 ENCOUNTER — Other Ambulatory Visit: Payer: Self-pay | Admitting: Family Medicine

## 2024-05-20 DIAGNOSIS — N3944 Nocturnal enuresis: Secondary | ICD-10-CM

## 2024-05-24 ENCOUNTER — Ambulatory Visit: Admitting: Family Medicine

## 2024-05-24 ENCOUNTER — Encounter: Payer: Self-pay | Admitting: Family Medicine

## 2024-05-24 VITALS — BP 124/68 | HR 83 | Resp 16 | Ht 69.0 in | Wt 191.0 lb

## 2024-05-24 DIAGNOSIS — F840 Autistic disorder: Secondary | ICD-10-CM | POA: Diagnosis not present

## 2024-05-24 DIAGNOSIS — N3944 Nocturnal enuresis: Secondary | ICD-10-CM

## 2024-05-24 DIAGNOSIS — F5105 Insomnia due to other mental disorder: Secondary | ICD-10-CM

## 2024-05-24 DIAGNOSIS — E538 Deficiency of other specified B group vitamins: Secondary | ICD-10-CM

## 2024-05-24 DIAGNOSIS — R569 Unspecified convulsions: Secondary | ICD-10-CM

## 2024-05-24 DIAGNOSIS — E785 Hyperlipidemia, unspecified: Secondary | ICD-10-CM

## 2024-05-24 DIAGNOSIS — F99 Mental disorder, not otherwise specified: Secondary | ICD-10-CM

## 2024-05-24 DIAGNOSIS — Z79899 Other long term (current) drug therapy: Secondary | ICD-10-CM

## 2024-05-24 MED ORDER — OXYBUTYNIN CHLORIDE 5 MG PO TABS: 5.0000 mg | ORAL_TABLET | Freq: Every evening | ORAL | 1 refills | Status: DC

## 2024-05-24 MED ORDER — ATORVASTATIN CALCIUM 20 MG PO TABS: 20.0000 mg | ORAL_TABLET | Freq: Every day | ORAL | 1 refills | Status: DC

## 2024-05-24 NOTE — Progress Notes (Signed)
 Name: Erik Henderson   MRN: [id number]    DOB: May 26, 1961   Date:05/24/2024       Progress Note  Subjective  Chief Complaint  Chief Complaint  Patient presents with   Medication Refill   Discussed the use of AI scribe software for clinical note transcription with the patient, who gave verbal consent to proceed.  History of Present Illness Erik Henderson is a 63 year old male with autism spectrum disorder, intellectual disability, and seizure disorder who presents for follow-up care.  He has a history of autism spectrum disorder and intellectual disability. He is non-verbal but can perform some tasks independently, such as eating, though he requires assistance with activities like showering. He exhibits repetitive motions but does not have significant behavioral issues. He is currently taking Lexapro 20 mg for his autism spectrum disorder.  He has a history of seizure disorder, with the most recent seizure occurring in 2022. He is currently on Depakote  250 mg, taking one tablet at breakfast, one at lunch, and three at night. He also takes Klonopin as needed for agitation.  He experiences insomnia and is under the care of a psychiatrist for this condition. He is prescribed trazodone 100 mg for sleep. He also has a history of psychogenic polydipsia, leading to excessive water intake and nocturnal enuresis. He has been prescribed oxybutynin  for nocturnal enuresis.  He has a history of prediabetes and is following a diabetic diet, which has resulted in a weight loss of 9 pounds since his last visit, decreasing from 200 pounds to 191 pounds. He is also on atorvastatin  20 mg for cholesterol management, with previous tests showing good control of his cholesterol levels.  His family history includes a brother with diabetes, which influences his dietary habits.    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   GERD  (gastroesophageal reflux disease) 05/29/2019   Insomnia due to other mental disorder 05/29/2019   Seizure (HCC) 04/16/2019   Behavior concern in adult 09/22/2018   Behavioral change 06/27/2018   Intellectual disability 06/27/2018   Hyperglycemia 08/17/2015   Anxiety 04/14/2015   Hyperlipidemia LDL goal <70 04/14/2015   Cognitive impairment 04/14/2015   Autism spectrum disorder 02/23/2015   Hay fever 02/21/2008    Past Surgical History:  Procedure Laterality Date   CATARACT EXTRACTION W/PHACO Left 08/31/2022   Procedure: CATARACT EXTRACTION PHACO AND INTRAOCULAR LENS PLACEMENT (IOC) LEFT VISION BLUE, HEALON 5 3.91 00:19.9;  Surgeon: Mittie Gaskin, MD;  Location: [facility];  Service: Ophthalmology;  Laterality: Left;   MRI     Sedation for MRI   NO PAST SURGERIES      Family History  Problem Relation Age of Onset   Hypertension Mother    Hyperlipidemia Mother    Diabetes Mother    Cancer Mother        stomach   Heart disease Mother    Coronary artery disease Father    Gout Father    Heart failure Father    Cancer Father        prostate   Hypertension Brother    Autism Brother    Mental retardation Brother    Stroke Brother     Social History   Tobacco Use   Smoking status: Never   Smokeless tobacco: Never   Tobacco comments:    smoking cessation materials not required  Substance Use Topics   Alcohol use: No  Alcohol/week: 0.0 standard drinks of alcohol     Current Outpatient Medications:    aspirin  EC 81 MG tablet, Take 1 tablet (81 mg total) by mouth daily. Swallow whole., Disp: , Rfl:    atorvastatin  (LIPITOR) 20 MG tablet, Take 1 tablet (20 mg total) by mouth daily., Disp: 30 tablet, Rfl: 0   clonazePAM (KLONOPIN) 0.5 MG tablet, Take 1 tablet by mouth daily as needed., Disp: , Rfl:    Cyanocobalamin (VITAMIN B-12) 1000 MCG SUBL, DISSOLVE 1 TABLET UNDER THE TONGUE THREE TIMES A WEEK., Disp: 12 tablet, Rfl: 5   divalproex  (DEPAKOTE ) 250 MG  DR tablet, Take 250-750 mg by mouth 3 (three) times daily. 1 breakfast, 1 lunch and 3 at night , Disp: , Rfl:    escitalopram (LEXAPRO) 20 MG tablet, Take 1 tablet by mouth daily., Disp: , Rfl:    oxybutynin  (DITROPAN ) 5 MG tablet, Take 1 tablet (5 mg total) by mouth every evening., Disp: 30 tablet, Rfl: 0   traZODone (DESYREL) 100 MG tablet, TAKE (1) TABLET BY MOUTH DAILY AT BEDTIME FOR SLEEP., Disp: , Rfl:   Allergies  Allergen Reactions   Pollen Extract     Other reaction(s): Other (See Comments) Patent allergic to dust, that causes pt to sneeze.    I personally reviewed active problem list, medication list, allergies, family history with the patient/caregiver today.   ROS  Ten systems reviewed and is negative except as mentioned in HPI    Objective Physical Exam MEASUREMENTS: Weight- 191. CONSTITUTIONAL: Patient appears well-developed and well-nourished. No distress. HEENT: Head atraumatic, normocephalic, neck supple. CARDIOVASCULAR: Normal rate, regular rhythm and normal heart sounds. No murmur heard. No edema in extremities. PULMONARY: Effort normal and breath sounds normal. Lungs clear to auscultation. No respiratory distress. ABDOMINAL: There is no tenderness or distention. MUSCULOSKELETAL: Normal gait. Without gross motor or sensory deficit. Normal range of motion in hands. PSYCHIATRIC: Patient has a normal mood and affect. Behavior is normal. Judgment and thought content normal. SKIN: Hyperpigmented spots on face.  Vitals:   05/24/24 1348  BP: 124/68  Pulse: 83  Resp: 16  SpO2: 96%  Weight: 191 lb (86.6 kg)  Height: 5' 9 (1.753 m)    Body mass index is 28.21 kg/m.    PHQ2/9:    02/01/2024    1:58 PM 08/10/2023   10:24 AM 01/26/2023    2:47 PM 08/18/2022   11:15 AM 07/12/2022   11:18 AM  Depression screen PHQ 2/9  Decreased Interest 0 2 1 0 2  Down, Depressed, Hopeless 0 0 0 0 0  PHQ - 2 Score 0 2 1 0 2  Altered sleeping 0 0   0  Tired, decreased  energy 0 0   0  Change in appetite 0 0   0  Feeling bad or failure about yourself  0 0   0  Trouble concentrating 0 0   3  Moving slowly or fidgety/restless 0 0   0  Suicidal thoughts 0 0   0  PHQ-9 Score 0 2   5  Difficult doing work/chores Not difficult at all        phq 9 is negative  Fall Risk:    02/01/2024    2:00 PM 08/10/2023   10:24 AM 04/04/2023   10:28 AM 01/26/2023    2:45 PM 08/18/2022   11:14 AM  Fall Risk   Falls in the past year? 0 0 0 0 0  Number falls in past yr: 0 0  0 0  Injury with Fall? 0 0  0 0  Risk for fall due to : No Fall Risks No Fall Risks No Fall Risks No Fall Risks   Follow up Falls prevention discussed;Falls evaluation completed Falls prevention discussed Falls prevention discussed Education provided;Falls prevention discussed Falls evaluation completed      Data saved with a previous flowsheet row definition      Assessment & Plan Autism spectrum disorder with intellectual disability and insomnia Continues psychiatric care via telemedicine. Medications include Lexapro for behavioral symptoms and trazodone for insomnia. No significant behavioral issues, but exhibits repetitive motions and is nonverbal. Collaborates well with daily activities. - Continue Lexapro 20 mg daily. - Continue trazodone 100 mg at bedtime.  Seizure disorder Last seizure in 2022, managed with Depakote . Previous episodes possibly related to hyponatremia from excessive water intake. - Continue Depakote  250 mg (1 tablet at breakfast, 1 at lunch, 3 at night). - Check Depakote  levels. - Order CBC, kidney, and liver function tests.  Psychogenic polydipsia Excessive water intake leading to frequent urination and possible hyponatremia.  Nocturnal Enuresis Likely related to psychogenic polydipsia and deep sleep. Currently taking oxybutynin  at night, but effectiveness is uncertain. - Continue oxybutynin  at night if beneficial. - Discontinue oxybutynin  if not  effective.  Prediabetes Recent weight loss of 9 pounds, likely due to adherence to a diabetic diet. Weight management is positive, and dietary monitoring by family is ongoing. - Continue diabetic diet. - Monitor weight and dietary intake.  Hyperlipidemia Managed with atorvastatin . Last cholesterol check showed LDL at 67, well-controlled. - Continue atorvastatin  20 mg daily. - Check cholesterol levels annually.  B12 deficiency - recheck labs

## 2024-05-28 ENCOUNTER — Ambulatory Visit: Payer: Self-pay | Admitting: Family Medicine

## 2024-05-29 ENCOUNTER — Other Ambulatory Visit: Payer: Self-pay

## 2024-05-29 DIAGNOSIS — D649 Anemia, unspecified: Secondary | ICD-10-CM

## 2024-05-29 LAB — COMPREHENSIVE METABOLIC PANEL WITH GFR
AG Ratio: 1.1 (calc) (ref 1.0–2.5)
ALT: 18 U/L (ref 9–46)
AST: 19 U/L (ref 10–35)
Albumin: 3.9 g/dL (ref 3.6–5.1)
Alkaline phosphatase (APISO): 58 U/L (ref 35–144)
BUN: 10 mg/dL (ref 7–25)
CO2: 31 mmol/L (ref 20–32)
Calcium: 9.5 mg/dL (ref 8.6–10.3)
Chloride: 99 mmol/L (ref 98–110)
Creat: 1 mg/dL (ref 0.70–1.35)
Globulin: 3.4 g/dL (ref 1.9–3.7)
Glucose, Bld: 72 mg/dL (ref 65–99)
Potassium: 4.3 mmol/L (ref 3.5–5.3)
Sodium: 137 mmol/L (ref 135–146)
Total Bilirubin: 0.4 mg/dL (ref 0.2–1.2)
Total Protein: 7.3 g/dL (ref 6.1–8.1)
eGFR: 85 mL/min/1.73m2 (ref >=60)

## 2024-05-29 LAB — CBC WITH DIFFERENTIAL/PLATELET
Absolute Lymphocytes: 2148 {cells}/uL (ref 850–3900)
Absolute Monocytes: 768 {cells}/uL (ref 200–950)
Basophils Absolute: 30 {cells}/uL (ref 0–200)
Basophils Relative: 0.5 %
Eosinophils Absolute: 102 {cells}/uL (ref 15–500)
Eosinophils Relative: 1.7 %
HCT: 40 % (ref 38.5–50.0)
Hemoglobin: 12.6 g/dL — ABNORMAL LOW (ref 13.2–17.1)
MCH: 29 pg (ref 27.0–33.0)
MCHC: 31.5 g/dL — ABNORMAL LOW (ref 32.0–36.0)
MCV: 92.2 fL (ref 80.0–100.0)
MPV: 10.3 fL (ref 7.5–12.5)
Monocytes Relative: 12.8 %
Neutro Abs: 2952 {cells}/uL (ref 1500–7800)
Neutrophils Relative %: 49.2 %
Platelets: 226 Thousand/uL (ref 140–400)
RBC: 4.34 Million/uL (ref 4.20–5.80)
RDW: 12.4 % (ref 11.0–15.0)
Total Lymphocyte: 35.8 %
WBC: 6 Thousand/uL (ref 3.8–10.8)

## 2024-05-29 LAB — LIPID PANEL
Cholesterol: 134 mg/dL (ref <200)
HDL: 43 mg/dL (ref >=40)
LDL Cholesterol (Calc): 71 mg/dL
Non-HDL Cholesterol (Calc): 91 mg/dL (ref <130)
Total CHOL/HDL Ratio: 3.1 (calc) (ref <5.0)
Triglycerides: 123 mg/dL (ref <150)

## 2024-05-29 LAB — TEST AUTHORIZATION

## 2024-05-29 LAB — B12 AND FOLATE PANEL
Folate: 15.9 ng/mL
Vitamin B-12: 879 pg/mL (ref 200–1100)

## 2024-05-29 LAB — IRON,TIBC AND FERRITIN PANEL
%SAT: 20 % (ref 20–48)
Ferritin: 131 ng/mL (ref 24–380)
Iron: 60 ug/dL (ref 50–180)
TIBC: 298 ug/dL (ref 250–425)

## 2024-05-29 LAB — VALPROIC ACID LEVEL: Valproic Acid Lvl: 65.8 mg/L (ref 50.0–100.0)

## 2024-06-17 ENCOUNTER — Other Ambulatory Visit: Payer: Self-pay | Admitting: Family Medicine

## 2024-06-17 DIAGNOSIS — E538 Deficiency of other specified B group vitamins: Secondary | ICD-10-CM

## 2024-06-26 DIAGNOSIS — F79 Unspecified intellectual disabilities: Secondary | ICD-10-CM | POA: Diagnosis not present

## 2024-06-26 DIAGNOSIS — F5105 Insomnia due to other mental disorder: Secondary | ICD-10-CM | POA: Diagnosis not present

## 2024-06-26 DIAGNOSIS — F840 Autistic disorder: Secondary | ICD-10-CM | POA: Diagnosis not present

## 2024-06-26 DIAGNOSIS — F99 Mental disorder, not otherwise specified: Secondary | ICD-10-CM | POA: Diagnosis not present

## 2024-09-20 ENCOUNTER — Telehealth: Payer: Self-pay

## 2024-09-20 NOTE — Progress Notes (Signed)
 Pharmacy Quality Measure Review  This patient is appearing on a report for being at risk of failing the adherence measure for cholesterol (statin) medications this calendar year.   Medication: atorvastatin  Last fill date: 09/11/24 for 90 day supply  Insurance report was not up to date. No action needed at this time.   Mackensie Pilson E. Marsh, PharmD, CPP Clinical Pharmacist [facility] [phone]

## 2024-11-08 ENCOUNTER — Ambulatory Visit: Payer: Self-pay

## 2024-11-08 NOTE — Telephone Encounter (Signed)
 FYI Only or Action Required?: FYI only for provider: UC advised.  Patient was last seen in primary care on 05/24/2024 by Glenard Mire, MD.  Called Nurse Triage reporting Fatigue, Fever, and Cough.  Symptoms began several days ago.  Interventions attempted: OTC medications: Acetaminophen.  Symptoms are: unchanged.  Triage Disposition: See Physician Within 24 Hours  Patient/caregiver understands and will follow disposition?: Yes  Reason for Disposition  Fever present > 3 days (72 hours)  Answer Assessment - Initial Assessment Questions Patient started to experience flu like symptoms on Tuesday including fever. Symptoms have not improved. Office visit advised, but no available appts until Monday. UC advised.   1. SYMPTOMS: What is your main symptom or concern? (e.g., cough, fever, shortness of breath, muscle aches)     Cough, fever  2. ONSET: When did the symptoms start?      Tuesday  3. COUGH: Do you have a cough? If Yes, ask: How bad is the cough?       Yes  4. FEVER: Do you have a fever? If Yes, ask: What is your temperature, how was it measured, and when did it start?     Yes, most recent checked the other day-103F  5. BREATHING DIFFICULTY: Are you having any difficulty breathing? (e.g., normal; shortness of breath, wheezing, unable to speak)      No SOB  6. BETTER-SAME-WORSE: Are you getting better, staying the same or getting worse compared to yesterday?  If getting worse, ask, In what way?     Same  7. OTHER SYMPTOMS: Do you have any other symptoms?  (e.g., chills, fatigue, headache, loss of smell or taste, muscle pain, sore throat)     Fatigue, diarrhea  8. INFLUENZA EXPOSURE: Was there any known exposure to influenza (flu) before the symptoms began?      Yes, going around household  9. INFLUENZA SUSPECTED: Why do you think you have influenza? (e.g., positive flu self-test at home, symptoms after exposure).     Symptoms after  exposure  10. INFLUENZA VACCINE: Have you had the flu vaccine? If Yes, ask: When did you last get it?       Unknown  11. HIGH RISK FOR COMPLICATIONS: Do you have any chronic medical problems? (e.g., asthma, heart or lung disease, obesity, weak immune system)       No  12. PREGNANCY: Is there any chance you are pregnant? When was your last menstrual period?       NA  13. O2 SATURATION MONITOR:  Do you use an oxygen saturation monitor (pulse oximeter) at home? If Yes, ask What is your reading (oxygen level) today? What is your usual oxygen saturation reading? (e.g., 95%)       No  Protocols used: Influenza (Flu) Suspected-A-AH  Copied from CRM [id number]. Topic: Clinical - Red Word Triage >> Nov 08, 2024  1:34 PM Rosaria BRAVO wrote: Red Word that prompted transfer to Nurse Triage: Severe coughing, flu symptoms. Danell Verno is calling. Pt's sister. Feverish. Loose stool. Weak.

## 2024-11-19 ENCOUNTER — Ambulatory Visit: Admitting: Family Medicine

## 2024-11-26 ENCOUNTER — Other Ambulatory Visit: Payer: Self-pay | Admitting: Family Medicine

## 2024-11-26 DIAGNOSIS — N3944 Nocturnal enuresis: Secondary | ICD-10-CM

## 2024-11-26 DIAGNOSIS — E785 Hyperlipidemia, unspecified: Secondary | ICD-10-CM

## 2024-11-27 NOTE — Telephone Encounter (Signed)
 Requested by interface surescripts. Last labs 05/24/24.  Requested Prescriptions  Pending Prescriptions Disp Refills   atorvastatin  (LIPITOR) 20 MG tablet [Pharmacy Med Name: atorvastatin  20 mg tablet] 90 tablet 1    Sig: TAKE ONE TABLET BY MOUTH DAILY     Cardiovascular:  Antilipid - Statins Failed - 11/27/2024  3:02 PM      Failed - Lipid Panel in normal range within the last 12 months    Cholesterol  Date Value Ref Range Status  05/24/2024 134 <200 mg/dL Final   LDL Cholesterol (Calc)  Date Value Ref Range Status  05/24/2024 71 mg/dL (calc) Final    Comment:    Reference range: <100 . Desirable range <100 mg/dL for primary prevention;   <70 mg/dL for patients with CHD or diabetic patients  with > or = 2 CHD risk factors. [name] LDL-C is now calculated using the Martin-Hopkins  calculation, which is a validated novel method providing  better accuracy than the Friedewald equation in the  estimation of LDL-C.  [name] [name] et al. [name] 7986;689(80): 2061-2068  (http://education.[facility].com/faq/FAQ164)    HDL  Date Value Ref Range Status  05/24/2024 43 > OR = 40 mg/dL Final   Triglycerides  Date Value Ref Range Status  05/24/2024 123 <150 mg/dL Final         Passed - Patient is not pregnant      Passed - Valid encounter within last 12 months    Recent Outpatient Visits           6 months ago Seizure [facility])   [place] [facility] [place], Krichna, MD               oxybutynin  (DITROPAN ) 5 MG tablet [Pharmacy Med Name: oxybutynin  chloride 5 mg tablet] 90 tablet 1    Sig: TAKE ONE TABLET BY MOUTH EVERY EVENING     Urology:  Bladder Agents Passed - 11/27/2024  3:02 PM      Passed - Valid encounter within last 12 months    Recent Outpatient Visits           6 months ago Seizure [facility])   [place] [facility] Glenard Mire, MD

## 2025-02-06 ENCOUNTER — Ambulatory Visit
